# Patient Record
Sex: Female | Born: 1975 | Race: White | Hispanic: No | Marital: Married | Smoking: Never smoker
Health system: Southern US, Community
[De-identification: ages and names within clinical notes are randomized; demographics above are authoritative.]

## PROBLEM LIST (undated history)

## (undated) DIAGNOSIS — Z5189 Encounter for other specified aftercare: Secondary | ICD-10-CM

## (undated) DIAGNOSIS — R32 Unspecified urinary incontinence: Secondary | ICD-10-CM

## (undated) DIAGNOSIS — N390 Urinary tract infection, site not specified: Secondary | ICD-10-CM

## (undated) DIAGNOSIS — E785 Hyperlipidemia, unspecified: Secondary | ICD-10-CM

## (undated) DIAGNOSIS — I10 Essential (primary) hypertension: Secondary | ICD-10-CM

## (undated) DIAGNOSIS — Z86718 Personal history of other venous thrombosis and embolism: Secondary | ICD-10-CM

## (undated) DIAGNOSIS — N319 Neuromuscular dysfunction of bladder, unspecified: Secondary | ICD-10-CM

## (undated) DIAGNOSIS — I2699 Other pulmonary embolism without acute cor pulmonale: Secondary | ICD-10-CM

## (undated) DIAGNOSIS — L659 Nonscarring hair loss, unspecified: Secondary | ICD-10-CM

## (undated) HISTORY — PX: OTHER SURGICAL HISTORY: SHX169

## (undated) HISTORY — DX: Encounter for other specified aftercare: Z51.89

## (undated) HISTORY — DX: Essential (primary) hypertension: I10

## (undated) HISTORY — DX: Hyperlipidemia, unspecified: E78.5

## (undated) HISTORY — PX: DILATION AND CURETTAGE OF UTERUS: SHX78

## (undated) HISTORY — DX: Unspecified urinary incontinence: R32

## (undated) HISTORY — DX: Nonscarring hair loss, unspecified: L65.9

## (undated) HISTORY — DX: Other pulmonary embolism without acute cor pulmonale: I26.99

## (undated) HISTORY — DX: Urinary tract infection, site not specified: N39.0

## (undated) HISTORY — DX: Neuromuscular dysfunction of bladder, unspecified: N31.9

## (undated) HISTORY — DX: Personal history of other venous thrombosis and embolism: Z86.718

---
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
## 2011-01-12 ENCOUNTER — Ambulatory Visit: Payer: Self-pay | Admitting: Family Medicine

## 2011-02-01 ENCOUNTER — Ambulatory Visit: Payer: Self-pay | Admitting: Family Medicine

## 2011-03-03 ENCOUNTER — Ambulatory Visit: Payer: Self-pay | Admitting: Family Medicine

## 2011-04-03 ENCOUNTER — Ambulatory Visit: Payer: Self-pay | Admitting: Family Medicine

## 2012-08-28 ENCOUNTER — Ambulatory Visit: Payer: Self-pay | Admitting: Physician Assistant

## 2014-01-15 ENCOUNTER — Ambulatory Visit: Payer: Self-pay | Admitting: Obstetrics & Gynecology

## 2014-01-15 LAB — BASIC METABOLIC PANEL
ANION GAP: 6 — AB (ref 7–16)
BUN: 17 mg/dL (ref 7–18)
Calcium, Total: 8.4 mg/dL — ABNORMAL LOW (ref 8.5–10.1)
Chloride: 109 mmol/L — ABNORMAL HIGH (ref 98–107)
Co2: 24 mmol/L (ref 21–32)
Creatinine: 0.82 mg/dL (ref 0.60–1.30)
EGFR (African American): >60
EGFR (Non-African Amer.): >60
GLUCOSE: 90 mg/dL (ref 65–99)
Osmolality: 279 (ref 275–301)
Potassium: 3.9 mmol/L (ref 3.5–5.1)
Sodium: 139 mmol/L (ref 136–145)

## 2014-01-15 LAB — CBC WITH DIFFERENTIAL/PLATELET
Basophil #: 0.1 10*3/uL (ref 0.0–0.1)
Basophil %: 0.9 %
EOS ABS: 0.4 10*3/uL (ref 0.0–0.7)
Eosinophil %: 4.7 %
HCT: 37.8 % (ref 35.0–47.0)
HGB: 12.8 g/dL (ref 12.0–16.0)
Lymphocyte #: 2 10*3/uL (ref 1.0–3.6)
Lymphocyte %: 26.7 %
MCH: 32.8 pg (ref 26.0–34.0)
MCHC: 33.9 g/dL (ref 32.0–36.0)
MCV: 97 fL (ref 80–100)
Monocyte #: 0.5 x10 3/mm (ref 0.2–0.9)
Monocyte %: 6 %
NEUTROS ABS: 4.7 10*3/uL (ref 1.4–6.5)
Neutrophil %: 61.7 %
PLATELETS: 240 10*3/uL (ref 150–440)
RBC: 3.9 10*6/uL (ref 3.80–5.20)
RDW: 12.9 % (ref 11.5–14.5)
WBC: 7.6 10*3/uL (ref 3.6–11.0)

## 2014-01-18 ENCOUNTER — Ambulatory Visit: Payer: Self-pay | Admitting: Obstetrics & Gynecology

## 2014-01-20 LAB — PATHOLOGY REPORT

## 2014-04-04 ENCOUNTER — Encounter: Payer: Self-pay | Admitting: Podiatrist

## 2014-04-13 ENCOUNTER — Ambulatory Visit (INDEPENDENT_AMBULATORY_CARE_PROVIDER_SITE_OTHER): Payer: Federal, State, Local not specified - PPO | Admitting: Podiatry

## 2014-04-13 ENCOUNTER — Ambulatory Visit (INDEPENDENT_AMBULATORY_CARE_PROVIDER_SITE_OTHER): Payer: Federal, State, Local not specified - PPO

## 2014-04-13 ENCOUNTER — Encounter: Payer: Self-pay | Admitting: Podiatry

## 2014-04-13 VITALS — Ht 67.0 in | Wt 264.0 lb

## 2014-04-13 DIAGNOSIS — M722 Plantar fascial fibromatosis: Secondary | ICD-10-CM

## 2014-04-13 MED ORDER — DICLOFENAC SODIUM 75 MG PO TBEC: 75.0000 mg | DELAYED_RELEASE_TABLET | Freq: Two times a day (BID) | ORAL | Status: DC

## 2014-04-13 MED ORDER — TRIAMCINOLONE ACETONIDE 10 MG/ML IJ SUSP
10.0000 mg | Freq: Once | INTRAMUSCULAR | Status: AC
  Administered 2014-04-13: 10 mg

## 2014-04-13 NOTE — Progress Notes (Signed)
   Subjective:    Patient ID: Bridget Gould, female    DOB: 1976-08-27, 38 y.o.   MRN: [id number]  HPI Comments: Both feet feel like i am walking on golf balls , the heel and the arches , pain radiates on the left foot to the back of the heel   Foot Pain      Review of Systems  Genitourinary: Positive for urgency.       Frequency       Objective:   Physical Exam        Assessment & Plan:

## 2014-04-13 NOTE — Patient Instructions (Signed)

## 2014-04-13 NOTE — Progress Notes (Signed)
Subjective:     Patient ID: Bridget Gould, female   DOB: Mar 18, 1976, 38 y.o.   MRN: [id number]  Foot Pain   patient presents stating that she feels like she is walking on golf balls and her heels have been hurting her for about 8 months   Review of Systems  All other systems reviewed and are negative.      Objective:   Physical Exam  Nursing note and vitals reviewed. Constitutional: She is oriented to person, place, and time.  Cardiovascular: Intact distal pulses.   Musculoskeletal: Normal range of motion.  Neurological: She is oriented to person, place, and time.  Skin: Skin is warm.   neurovascular status found to be intact with range of motion subtalar midtarsal joint adequate and muscle strength within normal limits. Patient is found to have good digital perfusion with moderate depression of the arch upon weightbearing and significant discomfort in the plantar fascia both heels at the insertion into the calcaneus     Assessment:     Plantar fasciitis of both heels with inflammation and fluid buildup    Plan:     H&P and x-rays reviewed and today I injected the plantar fascia both feet 3 mg Kenalog 5 mg like Marcaine mixture applied fascially brace is and instructed on physical therapy and shoe gear modification and also discussed future orthotics as we gauged to respond to injection treatment reappoint one week

## 2014-05-01 ENCOUNTER — Ambulatory Visit (INDEPENDENT_AMBULATORY_CARE_PROVIDER_SITE_OTHER): Payer: Federal, State, Local not specified - PPO | Admitting: Podiatry

## 2014-05-01 VITALS — BP 128/75 | HR 103 | Resp 16

## 2014-05-01 DIAGNOSIS — M722 Plantar fascial fibromatosis: Secondary | ICD-10-CM

## 2014-05-01 NOTE — Progress Notes (Signed)
Subjective:     Patient ID: Bridget Gould, female   DOB: December 10, 1975, 38 y.o.   MRN: [id number]  HPI patient states that the heels are feeling much better but she no she needs support for the long-term and she understands the stretching exercises   Review of Systems     Objective:   Physical Exam Neurovascular status intact with muscle strength adequate and significant diminishment of pain in the plantar fascia of both heels    Assessment:     Plantar fasciitis of both heels which is improving with conservative care    Plan:     Discussed physical therapy and orthotics for the long-term and scanned for custom orthotic devices

## 2014-05-14 ENCOUNTER — Encounter: Payer: Self-pay | Admitting: Podiatry

## 2014-05-14 NOTE — Progress Notes (Signed)
Patients orthotics have arrived

## 2014-05-15 ENCOUNTER — Telehealth: Payer: Self-pay | Admitting: Podiatry

## 2014-05-15 NOTE — Telephone Encounter (Signed)
Left message for Graceann to make appointment to pick up orthotics/also mailed postcard.

## 2014-05-25 ENCOUNTER — Ambulatory Visit (INDEPENDENT_AMBULATORY_CARE_PROVIDER_SITE_OTHER): Payer: Federal, State, Local not specified - PPO | Admitting: *Deleted

## 2014-05-25 VITALS — BP 128/75 | HR 103 | Resp 16

## 2014-05-25 DIAGNOSIS — M722 Plantar fascial fibromatosis: Secondary | ICD-10-CM

## 2014-05-25 NOTE — Patient Instructions (Signed)

## 2014-05-25 NOTE — Progress Notes (Signed)
Pt presents to pick up orthotics. Went over wearing instructions with pt. 

## 2015-02-23 NOTE — Op Note (Signed)
PATIENT NAME:  GEORGA, STYS MR#:  [id number] DATE OF BIRTH:  12-Mar-1976  DATE OF PROCEDURE:  01/18/2014  PREOPERATIVE DIAGNOSIS:  Abnormal uterine bleeding.   POSTOPERATIVE DIAGNOSIS: Abnormal uterine bleeding.  PROCEDURE PERFORMED: Hysteroscopy, dilation and curettage.   SURGEON: Glean Salen, MD  ANESTHESIA: General.   ESTIMATED BLOOD LOSS: Minimal.   COMPLICATIONS: None.   SPECIMENS: Endocervical curettage and endometrial curettage.   DISPOSITION: To recovery room stable findings at no obvious polyps or fibroids visualized although there were some fragmented proliferative endometrial tissue throughout.   TECHNIQUE: The patient is prepped and draped in the usual sterile fashion after adequate anesthesia is obtained in the dorsal lithotomy position. Bladder is drained with a Robinson catheter. Speculum is placed and the anterior lip of the cervix is grasped with a tenaculum. The endocervical canal is then curetted using a Kevorkian curette to obtain endocervical curettage specimen. The cervix is dilated and the uterus is sounded to 7 cm. Hysteroscope with distention of the intrauterine cavity using lactated Ringer's is performed with the above-mentioned findings visualized. There is a minimal discrepancy of fluid. A sharp curettage is performed with a banjo curette with specimen sent to pathology for further review. Excellent hemostasis is noted. The tenaculum is removed with excellent hemostasis noted. The patient goes to the recovery room in stable condition. All sponge, needle counts are correct.    ____________________________ R. Barnett Applebaum, MD rph:sg D: 01/18/2014 12:53:02 ET T: 01/18/2014 13:16:26 ET JOB#: [id number]  cc: Glean Salen, MD, <Dictator> Gae Dry MD ELECTRONICALLY SIGNED 01/19/2014 2:46

## 2015-06-13 ENCOUNTER — Ambulatory Visit
Admission: EM | Admit: 2015-06-13 | Discharge: 2015-06-13 | Disposition: A | Discharge status: Home / Self Care | Payer: Federal, State, Local not specified - PPO | Attending: Internal Medicine | Admitting: Internal Medicine

## 2015-06-13 ENCOUNTER — Encounter: Payer: Self-pay | Admitting: Emergency Medicine

## 2015-06-13 DIAGNOSIS — J0140 Acute pansinusitis, unspecified: Secondary | ICD-10-CM

## 2015-06-13 MED ORDER — PREDNISONE 50 MG PO TABS
50.0000 mg | ORAL_TABLET | Freq: Every day | ORAL | Status: DC
Start: 2015-06-13 — End: 2015-09-02

## 2015-06-13 MED ORDER — CEFDINIR 300 MG PO CAPS: 300.0000 mg | ORAL_CAPSULE | Freq: Two times a day (BID) | ORAL | Status: DC

## 2015-06-13 NOTE — ED Notes (Signed)
Patient c/o sinus pain and pressure, HAs, and runny nose for 3 weeks.  Patient denies fevers.

## 2015-06-13 NOTE — ED Provider Notes (Signed)
CSN: [id number]     Arrival date & time 06/13/15  1544 History   First MD Initiated Contact with Patient 06/13/15 1638     Chief Complaint  Patient presents with  . Facial Pain  . Headache  . Nasal Congestion   HPI Patient is a 39 year old lady who presents today with a three-week history of left sided sinus congestion, runny nose (copious), and morning headache. No sore throat, no cough. No fever. Her face is tender and uncomfortable over the maxillary and frontal areas. She does not have a history of sinus issues.  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 She is participating in a triathlon next weekend. No nausea/vomiting, no diarrhea. She has a good appetite. No achiness.  Past Medical History  Diagnosis Date  . Hypertension   . Neurogenic bladder   . Alopecia    Past Surgical History  Procedure Laterality Date  . 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 Dilation and curettage of uterus     Family history: Father has had a heart attack 2; aunt has had breast cancer; mother has had asthma Social History  Substance Use Topics  . Smoking status: Never Smoker   . Smokeless tobacco: Never Used  . Alcohol Use: Yes    Review of Systems  All other systems reviewed and are negative.   Allergies  Penicillins Has taken cephalosporins without difficulty  Home Medications   Prior to Admission medications   Medication Sig Start Date End Date Taking? Authorizing Provider  diclofenac (VOLTAREN) 75 MG EC tablet Take 1 tablet (75 mg total) by mouth 2 (two) times daily. 04/13/14   Wallene Huh, DPM  lisinopril (PRINIVIL,ZESTRIL) 5 MG tablet Take 5 mg by mouth daily.    Historical Provider, MD  nitrofurantoin (MACRODANTIN) 25 MG capsule Take 25 mg by mouth daily.    Historical Provider, MD  norethindrone (CAMILA) 0.35 MG tablet Take 1 tablet by mouth daily.    Historical Provider, MD  venlafaxine (EFFEXOR) 75  MG tablet Take 75 mg by mouth daily.    Historical Provider, MD   BP 142/76 mmHg  Pulse 79  Temp(Src) 98.9 F (37.2 C) (Oral)  Resp 16  Ht 5\' 7"  (1.702 m)  Wt 265 lb (120.203 kg)  BMI 41.50 kg/m2  SpO2 98%  LMP 05/24/2015 (Exact Date) Physical Exam  Constitutional: She is oriented to person, place, and time. No distress.  Alert, nicely groomed Enthusiastic and cheerful Alopecia, no hair on scalp, no eyelashes, no eye brows  HENT:  Head: Atraumatic.  Bilateral TMs are opaque, no erythema Marked left nasal congestion, moderate on the right, with mucousy material present Throat a little bit red with postnasal drainage evident  Eyes:  Conjugate gaze, no eye redness/drainage  Neck: Neck supple.  Cardiovascular: Normal rate and regular rhythm.   Pulmonary/Chest: No respiratory distress. She has no wheezes. She has no rales.  Lungs clear, symmetric breath sounds  Abdominal: She exhibits no distension.  Musculoskeletal: Normal range of motion.  No leg swelling  Neurological: She is alert and oriented to person, place, and time.  Skin: Skin is warm and dry.  No cyanosis  Nursing note and vitals reviewed.   ED Course  Procedures  None  MDM   1. Acute pansinusitis, recurrence not specified    Discharge Medication List as of 06/13/2015  4:50 PM  START taking these medications   Details  cefdinir (OMNICEF) 300 MG capsule Take 1 capsule (300 mg total) by mouth 2 (two) times daily., Starting 06/13/2015, Until Discontinued, Normal    predniSONE (DELTASONE) 50 MG tablet Take 1 tablet (50 mg total) by mouth daily., Starting 06/13/2015, Until Discontinued, Normal       Nasacort or flonase may help with residual congestion.  Recheck or follow-up PCP/Alicia Copland for persistent symptoms, if not starting to improve after 2 or 3 days, increasing nasal discharge, or new fever greater than 100.5.    Sherlene Shams, MD 06/13/15 [phone]

## 2015-06-13 NOTE — Discharge Instructions (Signed)
Prescriptions for omnicef (cefdinir, an antibiotic) and  prednisone (a steroid, for severe congestion) were sent to the [facility] in [place]. Recheck or followup pcp/Janet Dear if not starting to improve in about 2-3 days,  for new fever >100.5, or increasing runny nose. Nasacort or flonase over the counter may be helpful for residual congestion.  Sinusitis Sinusitis is redness, soreness, and puffiness (inflammation) of the air pockets in the bones of your face (sinuses). The redness, soreness, and puffiness can cause air and mucus to get trapped in your sinuses. This can allow germs to grow and cause an infection.  HOME CARE   Drink enough fluids to keep your pee (urine) clear or pale yellow.  Use a humidifier in your home.  Run a hot shower to create steam in the bathroom. Sit in the bathroom with the door closed. Breathe in the steam 3-4 times a day.  Put a warm, moist washcloth on your face 3-4 times a day, or as told by your doctor.  Use salt water sprays (saline sprays) to wet the thick fluid in your nose. This can help the sinuses drain.  Only take medicine as told by your doctor. GET HELP RIGHT AWAY IF:   Your pain gets worse.  You have very bad headaches.  You are sick to your stomach (nauseous).  You throw up (vomit).  You are very sleepy (drowsy) all the time.  Your face is puffy (swollen).  Your vision changes.  You have a stiff neck.  You have trouble breathing. MAKE SURE YOU:   Understand these instructions.  Will watch your condition.  Will get help right away if you are not doing well or get worse. Document Released: 04/06/2008 Document Revised: 07/13/2012 Document Reviewed: 05/24/2012 [facility] Patient Information 2015 [place], [place]. This information is not intended to replace advice given to you by your health care provider. Make sure you discuss any questions you have with your health care provider.

## 2015-08-03 DIAGNOSIS — I2699 Other pulmonary embolism without acute cor pulmonale: Secondary | ICD-10-CM

## 2015-08-03 HISTORY — DX: Other pulmonary embolism without acute cor pulmonale: I26.99

## 2015-08-20 ENCOUNTER — Encounter: Payer: Self-pay | Admitting: Emergency Medicine

## 2015-08-20 ENCOUNTER — Ambulatory Visit
Admission: EM | Admit: 2015-08-20 | Discharge: 2015-08-20 | Disposition: A | Discharge status: Home / Self Care | Payer: Federal, State, Local not specified - PPO | Attending: Family Medicine | Admitting: Family Medicine

## 2015-08-20 DIAGNOSIS — J0100 Acute maxillary sinusitis, unspecified: Secondary | ICD-10-CM | POA: Diagnosis not present

## 2015-08-20 LAB — RAPID STREP SCREEN (MED CTR MEBANE ONLY): Streptococcus, Group A Screen (Direct): NEGATIVE

## 2015-08-20 MED ORDER — AZITHROMYCIN 250 MG PO TABS: ORAL_TABLET | ORAL | Status: DC

## 2015-08-20 NOTE — ED Notes (Signed)
Patient c/o sore throat, runny nose, ear pain and sinus congestion since Friday.  Patient denies fevers.

## 2015-08-20 NOTE — ED Provider Notes (Signed)
CSN: [id number]     Arrival date & time 08/20/15  0732 History   First MD Initiated Contact with Patient 08/20/15 [phone]     Chief Complaint  Patient presents with  . Sore Throat  . Otalgia  . Facial Pain   (Consider location/radiation/quality/duration/timing/severity/associated sxs/prior Treatment) HPI Comments: 39 yo female with a 1 week h/o sore throat, sinus pressure, sinus headaches, sinus congestion, ear pressure. Denies fevers, chills, chest pains, shortness of breath.   The history is provided by the patient.    Past Medical History  Diagnosis Date  . Hypertension   . Neurogenic bladder   . Alopecia    Past Surgical History  Procedure Laterality Date  . Tumor removed    . Dilation and curettage of uterus     History reviewed. No pertinent family history. Social History  Substance Use Topics  . Smoking status: Never Smoker   . Smokeless tobacco: Never Used  . Alcohol Use: Yes   OB History    No data available     Review of Systems  Allergies  Penicillins  Home Medications   Prior to Admission medications   Medication Sig Start Date End Date Taking? Authorizing Provider  azithromycin (ZITHROMAX Z-PAK) 250 MG tablet 2 tabs po once day 1, then 1 tab po qd for next 4 days 08/20/15   Norval Gable, MD  cefdinir (OMNICEF) 300 MG capsule Take 1 capsule (300 mg total) by mouth 2 (two) times daily. 06/13/15   Sherlene Shams, MD  diclofenac (VOLTAREN) 75 MG EC tablet Take 1 tablet (75 mg total) by mouth 2 (two) times daily. 04/13/14   Wallene Huh, DPM  lisinopril (PRINIVIL,ZESTRIL) 5 MG tablet Take 5 mg by mouth daily.    Historical Provider, MD  nitrofurantoin (MACRODANTIN) 25 MG capsule Take 100 mg by mouth daily.     Historical Provider, MD  norethindrone (CAMILA) 0.35 MG tablet Take 1 tablet by mouth daily.    Historical Provider, MD  predniSONE (DELTASONE) 50 MG tablet Take 1 tablet (50 mg total) by mouth daily. 06/13/15   Sherlene Shams, MD  venlafaxine  (EFFEXOR) 75 MG tablet Take 75 mg by mouth daily.    Historical Provider, MD   Meds Ordered and Administered this Visit  Medications - No data to display  BP 132/76 mmHg  Pulse 79  Temp(Src) 97.6 F (36.4 C) (Tympanic)  Resp 16  Ht 5\' 7"  (1.702 m)  Wt 262 lb (118.842 kg)  BMI 41.03 kg/m2  SpO2 99%  LMP 08/14/2015 (Exact Date) No data found.   Physical Exam  Constitutional: She appears well-developed and well-nourished. No distress.  HENT:  Head: Normocephalic and atraumatic.  Right Ear: Tympanic membrane, external ear and ear canal normal.  Left Ear: Tympanic membrane, external ear and ear canal normal.  Nose: Mucosal edema and rhinorrhea present. No nose lacerations, sinus tenderness, nasal deformity, septal deviation or nasal septal hematoma. No epistaxis.  No foreign bodies. Right sinus exhibits maxillary sinus tenderness and frontal sinus tenderness. Left sinus exhibits maxillary sinus tenderness and frontal sinus tenderness.  Mouth/Throat: Uvula is midline and mucous membranes are normal. Posterior oropharyngeal erythema present. No oropharyngeal exudate.  Eyes: Conjunctivae and EOM are normal. Pupils are equal, round, and reactive to light. Right eye exhibits no discharge. Left eye exhibits no discharge. No scleral icterus.  Neck: Normal range of motion. Neck supple. No thyromegaly present.  Cardiovascular: Normal rate, regular rhythm and normal heart sounds.   Pulmonary/Chest: Effort normal  and breath sounds normal. No respiratory distress. She has no wheezes. She has no rales.  Lymphadenopathy:    She has no cervical adenopathy.  Skin: She is not diaphoretic.  Nursing note and vitals reviewed.   ED Course  Procedures (including critical care time)  Labs Review Labs Reviewed  RAPID STREP SCREEN (NOT AT [facility])  CULTURE, GROUP A STREP ([facility] ONLY)    Imaging Review No results found.   Visual Acuity Review  Right Eye Distance:   Left Eye Distance:   Bilateral  Distance:    Right Eye Near:   Left Eye Near:    Bilateral Near:         MDM   1. Acute maxillary sinusitis, recurrence not specified    Discharge Medication List as of 08/20/2015  8:14 AM    START taking these medications   Details  azithromycin (ZITHROMAX Z-PAK) 250 MG tablet 2 tabs po once day 1, then 1 tab po qd for next 4 days, Normal      1.  diagnosis reviewed with patient 2. rx as per orders above; reviewed possible side effects, interactions, risks and benefits  3. Recommend supportive treatment with otc flonase prn 4. Follow prn if symptoms worsen or don't improve    Norval Gable, MD 08/20/15 [phone]

## 2015-08-21 DIAGNOSIS — Z86018 Personal history of other benign neoplasm: Secondary | ICD-10-CM | POA: Insufficient documentation

## 2015-08-21 DIAGNOSIS — I2699 Other pulmonary embolism without acute cor pulmonale: Secondary | ICD-10-CM

## 2015-08-21 HISTORY — DX: Other pulmonary embolism without acute cor pulmonale: I26.99

## 2015-08-21 HISTORY — DX: Personal history of other benign neoplasm: Z86.018

## 2015-08-22 LAB — CULTURE, GROUP A STREP (THRC)

## 2015-09-02 ENCOUNTER — Encounter: Payer: Self-pay | Admitting: Nurse Practitioner

## 2015-09-02 ENCOUNTER — Ambulatory Visit (INDEPENDENT_AMBULATORY_CARE_PROVIDER_SITE_OTHER): Payer: Federal, State, Local not specified - PPO | Admitting: Nurse Practitioner

## 2015-09-02 VITALS — BP 108/62 | HR 85 | Temp 99.0°F | Resp 12 | Ht 67.0 in | Wt 258.0 lb

## 2015-09-02 DIAGNOSIS — N319 Neuromuscular dysfunction of bladder, unspecified: Secondary | ICD-10-CM

## 2015-09-02 DIAGNOSIS — I2699 Other pulmonary embolism without acute cor pulmonale: Secondary | ICD-10-CM | POA: Diagnosis not present

## 2015-09-02 DIAGNOSIS — E785 Hyperlipidemia, unspecified: Secondary | ICD-10-CM

## 2015-09-02 DIAGNOSIS — I10 Essential (primary) hypertension: Secondary | ICD-10-CM

## 2015-09-02 DIAGNOSIS — L659 Nonscarring hair loss, unspecified: Secondary | ICD-10-CM | POA: Diagnosis not present

## 2015-09-02 DIAGNOSIS — J209 Acute bronchitis, unspecified: Secondary | ICD-10-CM | POA: Diagnosis not present

## 2015-09-02 DIAGNOSIS — Z8679 Personal history of other diseases of the circulatory system: Secondary | ICD-10-CM | POA: Insufficient documentation

## 2015-09-02 DIAGNOSIS — Z86711 Personal history of pulmonary embolism: Secondary | ICD-10-CM | POA: Insufficient documentation

## 2015-09-02 LAB — CBC WITH DIFFERENTIAL/PLATELET
BASOS ABS: 0 10*3/uL (ref 0.0–0.1)
Basophils Relative: 0.5 % (ref 0.0–3.0)
Eosinophils Absolute: 0.5 10*3/uL (ref 0.0–0.7)
Eosinophils Relative: 4.8 % (ref 0.0–5.0)
HEMATOCRIT: 40.7 % (ref 36.0–46.0)
HEMOGLOBIN: 13.5 g/dL (ref 12.0–15.0)
LYMPHS ABS: 3.4 10*3/uL (ref 0.7–4.0)
LYMPHS PCT: 32.7 % (ref 12.0–46.0)
MCHC: 33.1 g/dL (ref 30.0–36.0)
MCV: 95.1 fl (ref 78.0–100.0)
MONOS PCT: 6.4 % (ref 3.0–12.0)
Monocytes Absolute: 0.7 10*3/uL (ref 0.1–1.0)
NEUTROS PCT: 55.6 % (ref 43.0–77.0)
Neutro Abs: 5.8 10*3/uL (ref 1.4–7.7)
Platelets: 328 10*3/uL (ref 150.0–400.0)
RBC: 4.28 Mil/uL (ref 3.87–5.11)
RDW: 13.3 % (ref 11.5–15.5)
WBC: 10.4 10*3/uL (ref 4.0–10.5)

## 2015-09-02 LAB — COMPREHENSIVE METABOLIC PANEL
ALK PHOS: 119 U/L — AB (ref 39–117)
ALT: 20 U/L (ref 0–35)
AST: 17 U/L (ref 0–37)
Albumin: 4.1 g/dL (ref 3.5–5.2)
BILIRUBIN TOTAL: 0.2 mg/dL (ref 0.2–1.2)
BUN: 16 mg/dL (ref 6–23)
CHLORIDE: 107 meq/L (ref 96–112)
CO2: 23 meq/L (ref 19–32)
Calcium: 9.5 mg/dL (ref 8.4–10.5)
Creatinine, Ser: 0.91 mg/dL (ref 0.40–1.20)
GFR: 73.42 mL/min (ref >60.00)
GLUCOSE: 79 mg/dL (ref 70–99)
POTASSIUM: 4.5 meq/L (ref 3.5–5.1)
SODIUM: 138 meq/L (ref 135–145)
TOTAL PROTEIN: 7.7 g/dL (ref 6.0–8.3)

## 2015-09-02 NOTE — Assessment & Plan Note (Signed)
Pt is improving. She has finished her antibiotics. We will obtain a CMET today to check for hyperkalemia (lisinopril and bactrim DS taken together). Will follow as needed. Referred to Pulmonology at [facility] for follow up.

## 2015-09-02 NOTE — Progress Notes (Signed)
Patient ID: Bridget Gould, female    DOB: 1976/06/19  Age: 39 y.o. MRN: [id number]  CC: Establish Care   HPI Bridget Gould presents for establishing care and CC of hospital follow up.   1) New pt info:  Immunizations- unknown tdap, flu vaccine given 08/22/2015  Mammogram- 2016   Pap- 01/5858 Seeing Bridget Gould Exam- 08/10/15   Foot exam- 05/2014 non-diabetic [facility]   LMP- 08/15/15 lasted 7 and was normal for pt  2) Chronic Problems-  HTN/HLD- Stable currently, labs this July   Blood clots- PE  Blood transfusion 1978  Urinary incontinence- Neurogenic bladder [facility] in [place]- Dr. Matilde Gould neurogenic bladder, self-cathing twice a year  Alopecia- Worked up at [facility] and no determined cause   Started in 2004 and had no hair by 2005 she reports  3) Acute Problems-  Woke up on the 19th at 0330 and felt she couldn't breathe, tried things at home like hot shower, tea, and cough drops. Called husband and he advised his mother to take her to the hospital. She was seen at [facility] around 10 am.   Hospital visit to [facility] for Acute Resp. Failure with hypoxia and PE. Initial CTA was read as normal, but had poor quality and after the radiologist reviewed films and high clinical suspicion she was then diagnosed. RLL vasculature probable area.    Apixaban 10 mg x 6 days two tablets twice daily followed by 3 months of 5 mg BID then discuss cessation.   History Bridget Gould has a past medical history of Hypertension; Neurogenic bladder; Alopecia; Hyperlipidemia; H/O blood clots; Blood transfusion without reported diagnosis (1978); Urine incontinence; Urinary tract infection; Alopecia; Neurogenic bladder; and Pulmonary embolism (Bridget Gould) (08/2015).   She has past surgical history that includes tumor removed; Dilation and curettage of uterus; and tumor (1978/2004).   Her family history includes Alcohol abuse in her sister; Arthritis in her father; Cancer in her maternal aunt  and maternal grandfather; Diabetes in her paternal grandfather; Drug abuse in her sister; Heart disease in her father; Hyperlipidemia in her father and mother; Hypertension in her father; Mental illness in her sister.She reports that she has never smoked. She has never used smokeless tobacco. She reports that she drinks alcohol. She reports that she does not use illicit drugs.  Outpatient Prescriptions Prior to Visit  Medication Sig Dispense Refill  . lisinopril (PRINIVIL,ZESTRIL) 5 MG tablet Take 5 mg by mouth daily.    . norethindrone (CAMILA) 0.35 MG tablet Take 1 tablet by mouth daily.    Marland Kitchen venlafaxine (EFFEXOR) 75 MG tablet Take 75 mg by mouth daily.    . nitrofurantoin (MACRODANTIN) 25 MG capsule Take 100 mg by mouth daily.     Marland Kitchen azithromycin (ZITHROMAX Z-PAK) 250 MG tablet 2 tabs po once day 1, then 1 tab po qd for next 4 days (Patient not taking: Reported on 09/02/2015) 6 each 0  . cefdinir (OMNICEF) 300 MG capsule Take 1 capsule (300 mg total) by mouth 2 (two) times daily. (Patient not taking: Reported on 09/02/2015) 20 capsule 0  . diclofenac (VOLTAREN) 75 MG EC tablet Take 1 tablet (75 mg total) by mouth 2 (two) times daily. (Patient not taking: Reported on 09/02/2015) 50 tablet 2  . predniSONE (DELTASONE) 50 MG tablet Take 1 tablet (50 mg total) by mouth daily. (Patient not taking: Reported on 09/02/2015) 3 tablet 0   No facility-administered medications prior to visit.    ROS Review of Systems  Constitutional: Negative for fever, chills, diaphoresis and fatigue.  Respiratory: Negative for chest tightness, shortness of breath and wheezing.   Cardiovascular: Negative for chest pain, palpitations and leg swelling.  Gastrointestinal: Negative for nausea, vomiting and diarrhea.  Skin: Negative for rash.  Neurological: Negative for dizziness, weakness, numbness and headaches.  Hematological: Does not bruise/bleed easily.  Psychiatric/Behavioral: The patient is not nervous/anxious.      Objective:  BP 108/62 mmHg  Pulse 85  Temp(Src) 99 F (37.2 C)  Resp 12  Ht 5' 7"  (1.702 m)  Wt 258 lb (117.028 kg)  BMI 40.40 kg/m2  SpO2 97%  LMP 08/14/2015 (Exact Date)  Physical Exam  Constitutional: She is oriented to person, place, and time. She appears well-developed and well-nourished. No distress.  HENT:  Head: Normocephalic and atraumatic.  Right Ear: External ear normal.  Left Ear: External ear normal.  Alopecia  Eyes: Right eye exhibits no discharge. Left eye exhibits no discharge. No scleral icterus.  Cardiovascular: Normal rate, regular rhythm and normal heart sounds.  Exam reveals no gallop and no friction rub.   No murmur heard. Pulmonary/Chest: Effort normal and breath sounds normal. No respiratory distress. She has no wheezes. She has no rales. She exhibits no tenderness.  Neurological: She is alert and oriented to person, place, and time. No cranial nerve deficit. She exhibits normal muscle tone. Coordination normal.  Skin: Skin is warm and dry. No rash noted. She is not diaphoretic.  Psychiatric: She has a normal mood and affect. Her behavior is normal. Judgment and thought content normal.  Pleasant personality   Assessment & Plan:   Bridget Gould was seen today for establish care.  Diagnoses and all orders for this visit:  Other acute pulmonary embolism without acute cor pulmonale (HCC) -     Ambulatory referral to Pulmonology -     Comp Met (CMET) -     Factor 5 leiden -     CBC w/Diff  Acute bronchitis, unspecified organism -     Comp Met (CMET) -     CBC w/Diff  Neurogenic bladder  Alopecia  Benign essential HTN  HLD (hyperlipidemia)   I have discontinued Bridget Gould's diclofenac, cefdinir, predniSONE, and azithromycin. I am also having her maintain her norethindrone, lisinopril, nitrofurantoin, venlafaxine, sulfamethoxazole-trimethoprim, and apixaban.  Meds ordered this encounter  Medications  . sulfamethoxazole-trimethoprim  (BACTRIM,SEPTRA) 400-80 MG tablet    Sig: Take 1 tablet by mouth daily.  Marland Kitchen apixaban (ELIQUIS) 5 MG TABS tablet    Sig: Take 5 mg by mouth daily.     Follow-up: Return in about 7 weeks (around 10/21/2015) for Follow up for anticoagulation .

## 2015-09-02 NOTE — Assessment & Plan Note (Signed)
Unknown etiology. Patient was worked up at [facility].

## 2015-09-02 NOTE — Assessment & Plan Note (Signed)
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 She is followed by [facility] in [place]. Her next appointment with them is November 7. He reports ONLY twice a year. She does have frequent urinary tract infections. She is back on nitrofurantoin.

## 2015-09-02 NOTE — Assessment & Plan Note (Addendum)
Patient had probable labs done at her OB/GYN appointment in July this year. Will obtain records  Encouraged healthy diet and exercise

## 2015-09-02 NOTE — Patient Instructions (Addendum)
Continue your current regimen.   Continue to follow up with [facility].   We will contact you with your pulmonary referral.   Please visit the lab before leaving today.  Welcome to [facility]! Nice to meet you.

## 2015-09-02 NOTE — Assessment & Plan Note (Signed)
BP Readings from Last 3 Encounters:  09/02/15 108/62  08/20/15 132/76  06/13/15 142/76   Patient is stable currently will continue on lisinopril 5 mg daily.

## 2015-09-02 NOTE — Assessment & Plan Note (Signed)
Pt was strongly suspected to have RLL vascular PE after initial CTA was normal. Eliquis started. Pt feels much improved and denies nosebleeds or gums bleeding. Will check a factor 5 leiden today. Obtain CMET and CBC w/ diff and refer to pulmonology per [facility].

## 2015-09-02 NOTE — Progress Notes (Signed)
Pre visit review using our clinic review tool, if applicable. No additional management support is needed unless otherwise documented below in the visit note. 

## 2015-09-05 LAB — FACTOR 5 LEIDEN

## 2015-09-12 ENCOUNTER — Telehealth: Payer: Self-pay | Admitting: Internal Medicine

## 2015-09-12 ENCOUNTER — Ambulatory Visit (INDEPENDENT_AMBULATORY_CARE_PROVIDER_SITE_OTHER): Payer: Federal, State, Local not specified - PPO | Admitting: Internal Medicine

## 2015-09-12 ENCOUNTER — Encounter: Payer: Self-pay | Admitting: Internal Medicine

## 2015-09-12 ENCOUNTER — Ambulatory Visit
Admission: RE | Admit: 2015-09-12 | Discharge: 2015-09-12 | Disposition: A | Discharge status: Home / Self Care | Payer: Federal, State, Local not specified - PPO | Source: Ambulatory Visit | Attending: Internal Medicine | Admitting: Internal Medicine

## 2015-09-12 VITALS — BP 134/86 | HR 73 | Ht 67.0 in | Wt 260.0 lb

## 2015-09-12 DIAGNOSIS — R0600 Dyspnea, unspecified: Secondary | ICD-10-CM | POA: Diagnosis not present

## 2015-09-12 DIAGNOSIS — R0602 Shortness of breath: Secondary | ICD-10-CM | POA: Insufficient documentation

## 2015-09-12 DIAGNOSIS — D6859 Other primary thrombophilia: Secondary | ICD-10-CM | POA: Diagnosis not present

## 2015-09-12 NOTE — Telephone Encounter (Signed)
[name] from [name] vascular dept called report for Bilateral Venous Doppler. States pt is negative for DVT. Dr. [name] informed. Nothing further needed.

## 2015-09-12 NOTE — Patient Instructions (Signed)

## 2015-09-12 NOTE — Progress Notes (Signed)
[facility] Consultation      Date: 09/12/2015,   MRN# [id number] DENAYE ANSPACH 09-Jan-1976 Code Status:  Hosp day:@LENGTHOFSTAYDAYS @ Referring MD: @ATDPROV @     PCP:      AdmissionWeight: 260 lb (117.935 kg)                 CurrentWeight: 260 lb (117.935 kg) Chasady L Okeefe is a 39 y.o. old female seen in consultation for recent dx of PE.    CHIEF COMPLAINT:  Ct chest with PE, cough, SOB    HISTORY OF PRESENT ILLNESS  39 yo white female seen today for recent dx of PE on CT scan reports(CT chest not available-from outside hospital) Patient has sinus congestion on Oct 18th-was seen at urgent care and was given ABX On OCT 19, patient had acute SOB, Ct chest reports showed RLL PE-was started on elliquos  Patient has associated symptoms of extreme fatigue since dx of PE, along with progressive SOB Patient otherwise very active, trained for marathons in the past, she weighs 260 pounds, does NOT smoke Patient has neurogenic bladder on chronic nitrofurantion for 10 years  She has no lower ext/leg swelling, she has no fevers, chills, NVD. No previous h/o blood clots, no family history of blood clots  Patient dx with alopecia in 2005-states that she ws worked up for lupus in the past Patient on camilla-non estrogen therapy, she is a nonsmoker, does not use street drugs  All other ROS negative  PAST MEDICAL HISTORY   Past Medical History  Diagnosis Date  . Hypertension   . Neurogenic bladder   . Alopecia   . Hyperlipidemia   . H/O blood clots   . Blood transfusion without reported diagnosis 1978  . Urine incontinence   . Urinary tract infection   . Alopecia   . Neurogenic bladder   . Pulmonary embolism ([place]) 08/2015     SURGICAL HISTORY   Past Surgical History  Procedure Laterality Date  . Tumor removed    . Dilation and curettage of uterus    . 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 Hyperlipidemia Mother   . Arthritis Father   . Hyperlipidemia Father   . Heart disease Father   . Hypertension Father   . Alcohol abuse Sister   . Drug abuse Sister   . Mental illness Sister     BiPolar  . Cancer Maternal Aunt     Breast Cancer  . Cancer Maternal Grandfather     Colon/Prostate Cancer  . Diabetes Paternal Grandfather      SOCIAL HISTORY   Social History  Substance Use Topics  . Smoking status: Never Smoker   . Smokeless tobacco: Never Used  . Alcohol Use: 0.0 oz/week    0 Standard drinks or equivalent per week     Comment: 1 drink every other week      MEDICATIONS    Home Medication:  Current Outpatient Rx  Name  Route  Sig  Dispense  Refill  . apixaban (ELIQUIS) 5 MG TABS tablet   Oral   Take 5 mg by mouth daily.         Marland Kitchen lisinopril (PRINIVIL,ZESTRIL) 5 MG tablet   Oral   Take 5 mg by mouth daily.         . norethindrone (CAMILA) 0.35 MG tablet   Oral  Take 1 tablet by mouth daily.         Marland Kitchen sulfamethoxazole-trimethoprim (BACTRIM,SEPTRA) 400-80 MG tablet   Oral   Take 1 tablet by mouth daily.         Marland Kitchen venlafaxine (EFFEXOR) 75 MG tablet   Oral   Take 75 mg by mouth daily.           Current Medication:  Current outpatient prescriptions:  .  apixaban (ELIQUIS) 5 MG TABS tablet, Take 5 mg by mouth daily., Disp: , Rfl:  .  lisinopril (PRINIVIL,ZESTRIL) 5 MG tablet, Take 5 mg by mouth daily., Disp: , Rfl:  .  norethindrone (CAMILA) 0.35 MG tablet, Take 1 tablet by mouth daily., Disp: , Rfl:  .  sulfamethoxazole-trimethoprim (BACTRIM,SEPTRA) 400-80 MG tablet, Take 1 tablet by mouth daily., Disp: , Rfl:  .  venlafaxine (EFFEXOR) 75 MG tablet, Take 75 mg by mouth daily., Disp: , Rfl:     ALLERGIES   Penicillins     REVIEW OF SYSTEMS   Review of Systems  Constitutional: Positive for malaise/fatigue. Negative for fever, chills, weight loss and diaphoresis.  HENT: Negative for hearing loss and  tinnitus.   Eyes: Negative for blurred vision and double vision.  Respiratory: Positive for cough and shortness of breath. Negative for hemoptysis, sputum production and wheezing.   Cardiovascular: Positive for orthopnea. Negative for claudication and leg swelling.  Gastrointestinal: Negative for heartburn, nausea, vomiting and abdominal pain.  Genitourinary: Positive for urgency and frequency.  Musculoskeletal: Negative for myalgias, back pain and neck pain.  Neurological: Positive for weakness. Negative for dizziness, tingling, tremors and headaches.  Endo/Heme/Allergies: Does not bruise/bleed easily.  Psychiatric/Behavioral: Negative for depression. The patient is not nervous/anxious.      VS: BP 134/86 mmHg  Pulse 73  Ht 5\' 7"  (1.702 m)  Wt 260 lb (117.935 kg)  BMI 40.71 kg/m2  SpO2 97%  LMP 08/14/2015 (Exact Date)     PHYSICAL EXAM  Physical Exam  Constitutional: She is oriented to person, place, and time. She appears well-developed and well-nourished. No distress.  HENT:  Head: Normocephalic and atraumatic.  Mouth/Throat: No oropharyngeal exudate.  Eyes: EOM are normal. Pupils are equal, round, and reactive to light.  Neck: Normal range of motion. Neck supple.  Cardiovascular: Normal rate, regular rhythm and normal heart sounds.   No murmur heard. Pulmonary/Chest: No respiratory distress. She has no wheezes.  Abdominal: Soft. Bowel sounds are normal.  Musculoskeletal: Normal range of motion. She exhibits no edema.  Neurological: She is alert and oriented to person, place, and time.  Skin: Skin is warm. She is not diaphoretic.  Psychiatric: She has a normal mood and affect.        ASSESSMENT/PLAN   39 yo obese white female with recent dx of RLL Pulmonary embolism, at this point, patient will need extensive work up for cause of her blood clot  1.will need 6MWT  To assess oxygen needs 2.overnight pulse oximetry 3.check ECHO to assess funnction and PAP  pressure 4.US lower ext to assess for DVT 5.will need Hem ONC consiult for hyper-coaguable state work up 6.will need 6MWT      The Patient requires high complexity decision making for assessment and support, frequent evaluation and titration of therapies, application of advanced monitoring technologies and extensive interpretation of multiple databases.  Patient satisfied with Plan of action and management. All questions answered  Corrin Parker, M.D.  [facility]  Medical Director [place] Director  [facility] Cardio-Pulmonary Department

## 2015-09-17 ENCOUNTER — Ambulatory Visit (INDEPENDENT_AMBULATORY_CARE_PROVIDER_SITE_OTHER): Payer: Federal, State, Local not specified - PPO | Admitting: Internal Medicine

## 2015-09-17 DIAGNOSIS — R0600 Dyspnea, unspecified: Secondary | ICD-10-CM

## 2015-09-17 LAB — PULMONARY FUNCTION TEST
DL/VA % pred: 89 %
DL/VA: 4.62 ml/min/mmHg/L
DLCO UNC % PRED: 77 %
DLCO UNC: 22.04 ml/min/mmHg
FEF 25-75 PRE: 4.45 L/s
FEF 25-75 Post: 4.18 L/sec
FEF2575-%CHANGE-POST: -6 %
FEF2575-%PRED-PRE: 131 %
FEF2575-%Pred-Post: 123 %
FEV1-%CHANGE-POST: 5 %
FEV1-%PRED-POST: 102 %
FEV1-%Pred-Pre: 97 %
FEV1-Post: 3.43 L
FEV1-Pre: 3.27 L
FEV1FVC-%Change-Post: -1 %
FEV1FVC-%Pred-Pre: 107 %
FEV6-%CHANGE-POST: 6 %
FEV6-%PRED-POST: 97 %
FEV6-%Pred-Pre: 91 %
FEV6-POST: 3.91 L
FEV6-PRE: 3.66 L
FEV6FVC-%PRED-POST: 102 %
FEV6FVC-%PRED-PRE: 102 %
FVC-%CHANGE-POST: 6 %
FVC-%Pred-Post: 95 %
FVC-%Pred-Pre: 89 %
FVC-Post: 3.91 L
FVC-Pre: 3.67 L
POST FEV1/FVC RATIO: 88 %
PRE FEV6/FVC RATIO: 100 %
Post FEV6/FVC ratio: 100 %
Pre FEV1/FVC ratio: 89 %
RV % PRED: 108 %
RV: 1.85 L
TLC % PRED: 114 %
TLC: 6.32 L

## 2015-09-17 NOTE — Progress Notes (Signed)
PFT performed today. 

## 2015-09-17 NOTE — Progress Notes (Signed)
SMW performed today. 

## 2015-09-18 ENCOUNTER — Ambulatory Visit: Payer: Federal, State, Local not specified - PPO | Admitting: Oncology

## 2015-09-19 ENCOUNTER — Encounter: Payer: Self-pay | Admitting: Internal Medicine

## 2015-10-03 ENCOUNTER — Other Ambulatory Visit: Payer: Federal, State, Local not specified - PPO

## 2015-10-08 ENCOUNTER — Inpatient Hospital Stay: Payer: Federal, State, Local not specified - PPO

## 2015-10-08 ENCOUNTER — Inpatient Hospital Stay: Payer: Federal, State, Local not specified - PPO | Attending: Oncology | Admitting: Oncology

## 2015-10-08 ENCOUNTER — Ambulatory Visit (INDEPENDENT_AMBULATORY_CARE_PROVIDER_SITE_OTHER): Payer: Federal, State, Local not specified - PPO

## 2015-10-08 ENCOUNTER — Other Ambulatory Visit: Payer: Self-pay

## 2015-10-08 VITALS — BP 117/79 | HR 130 | Temp 98.1°F | Resp 18 | Wt 264.1 lb

## 2015-10-08 DIAGNOSIS — I2699 Other pulmonary embolism without acute cor pulmonale: Secondary | ICD-10-CM

## 2015-10-08 DIAGNOSIS — E785 Hyperlipidemia, unspecified: Secondary | ICD-10-CM | POA: Insufficient documentation

## 2015-10-08 DIAGNOSIS — Z86711 Personal history of pulmonary embolism: Secondary | ICD-10-CM

## 2015-10-08 DIAGNOSIS — R0600 Dyspnea, unspecified: Secondary | ICD-10-CM

## 2015-10-08 DIAGNOSIS — N319 Neuromuscular dysfunction of bladder, unspecified: Secondary | ICD-10-CM | POA: Insufficient documentation

## 2015-10-08 DIAGNOSIS — Z79899 Other long term (current) drug therapy: Secondary | ICD-10-CM | POA: Insufficient documentation

## 2015-10-08 DIAGNOSIS — I10 Essential (primary) hypertension: Secondary | ICD-10-CM | POA: Insufficient documentation

## 2015-10-08 DIAGNOSIS — Z7901 Long term (current) use of anticoagulants: Secondary | ICD-10-CM | POA: Insufficient documentation

## 2015-10-08 NOTE — Progress Notes (Signed)
Patient had an unprovoked PE on 08/20/15 and is here for coagulation work up.

## 2015-10-09 LAB — ANTITHROMBIN III: ANTITHROMB III FUNC: 118 % (ref 75–120)

## 2015-10-14 LAB — LUPUS ANTICOAGULANT PANEL
DRVVT: 43.8 s (ref 0.0–44.0)
PTT Lupus Anticoagulant: 33.8 s (ref 0.0–40.6)

## 2015-10-14 LAB — CARDIOLIPIN ANTIBODIES, IGG, IGM, IGA: Anticardiolipin IgA: <9 APL U/mL (ref 0–11)

## 2015-10-14 LAB — PROTHROMBIN GENE MUTATION

## 2015-10-14 LAB — BETA-2-GLYCOPROTEIN I ABS, IGG/M/A

## 2015-10-14 LAB — PROTEIN S ACTIVITY: PROTEIN S ACTIVITY: 107 % (ref 63–140)

## 2015-10-14 LAB — PROTEIN S, TOTAL: Protein S Ag, Total: 140 % (ref 60–150)

## 2015-10-14 LAB — HOMOCYSTEINE: Homocysteine: 9.7 umol/L (ref 0.0–15.0)

## 2015-10-14 LAB — PROTEIN C, TOTAL: PROTEIN C, TOTAL: 113 % (ref 60–150)

## 2015-10-14 LAB — FACTOR 5 LEIDEN

## 2015-10-14 LAB — PROTEIN C ACTIVITY: Protein C Activity: 144 % (ref 73–180)

## 2015-10-21 ENCOUNTER — Encounter: Payer: Self-pay | Admitting: Internal Medicine

## 2015-10-21 ENCOUNTER — Ambulatory Visit (INDEPENDENT_AMBULATORY_CARE_PROVIDER_SITE_OTHER): Payer: Federal, State, Local not specified - PPO | Admitting: Nurse Practitioner

## 2015-10-21 ENCOUNTER — Ambulatory Visit: Payer: Federal, State, Local not specified - PPO | Admitting: Nurse Practitioner

## 2015-10-21 ENCOUNTER — Ambulatory Visit (INDEPENDENT_AMBULATORY_CARE_PROVIDER_SITE_OTHER): Payer: Federal, State, Local not specified - PPO | Admitting: Internal Medicine

## 2015-10-21 ENCOUNTER — Encounter: Payer: Self-pay | Admitting: Nurse Practitioner

## 2015-10-21 VITALS — BP 124/68 | HR 85 | Ht 67.0 in | Wt 263.4 lb

## 2015-10-21 VITALS — BP 118/70 | HR 88 | Ht 67.0 in | Wt 263.0 lb

## 2015-10-21 DIAGNOSIS — I2699 Other pulmonary embolism without acute cor pulmonale: Secondary | ICD-10-CM | POA: Diagnosis not present

## 2015-10-21 DIAGNOSIS — J209 Acute bronchitis, unspecified: Secondary | ICD-10-CM | POA: Diagnosis not present

## 2015-10-21 DIAGNOSIS — L659 Nonscarring hair loss, unspecified: Secondary | ICD-10-CM

## 2015-10-21 DIAGNOSIS — E785 Hyperlipidemia, unspecified: Secondary | ICD-10-CM

## 2015-10-21 DIAGNOSIS — I10 Essential (primary) hypertension: Secondary | ICD-10-CM

## 2015-10-21 DIAGNOSIS — N319 Neuromuscular dysfunction of bladder, unspecified: Secondary | ICD-10-CM

## 2015-10-21 MED ORDER — LISINOPRIL 5 MG PO TABS: 5.0000 mg | ORAL_TABLET | Freq: Every day | ORAL | Status: DC

## 2015-10-21 NOTE — Patient Instructions (Signed)

## 2015-10-21 NOTE — Assessment & Plan Note (Signed)
Patient seen by pulmonology and was given the green light to only see hematology oncology. Patient will follow up again with Dr. Grayland Ormond in January. Continue Eliquis until this time. Patient is doing very well

## 2015-10-21 NOTE — Patient Instructions (Addendum)
See you in the summer or sooner if needed! Happy New Year!

## 2015-10-21 NOTE — Assessment & Plan Note (Signed)
We'll follow in 6 months

## 2015-10-21 NOTE — Assessment & Plan Note (Signed)
This is resolved

## 2015-10-21 NOTE — Assessment & Plan Note (Signed)
BP Readings from Last 3 Encounters:  10/21/15 118/70  10/21/15 124/68  10/08/15 117/79   Patient is stable we'll continue current regimen. Refills called in for 6 months to pharmacy.

## 2015-10-21 NOTE — Progress Notes (Signed)
Patient ID: Bridget Gould, female    DOB: [birth year]  Age: 39 y.o. MRN: [id number]  CC: No chief complaint on file.   HPI Bridget Gould presents for follow up PE.  1) Saw Dr. Mortimer Gould on 10/21/15 for f/u after PE of RLL Followed up with Dr. Grayland Gould for work up of hypercoagulopathy  Seeing him one more time in Jan.   Labs seem to be negative for any findings at this time  LMP Oct. Placed on Depo- provera until Jan and may be getting back onto [place] she reports.   History Bridget Gould has a past medical history of Hypertension; Neurogenic bladder; Alopecia; Hyperlipidemia; H/O blood clots; Blood transfusion without reported diagnosis (1978); Urine incontinence; Urinary tract infection; Alopecia; Neurogenic bladder; and Pulmonary embolism ([place]) (08/2015).   She has past surgical history that includes tumor removed; Dilation and curettage of uterus; and tumor (1978/2004).   Her family history includes Alcohol abuse in her sister; Arthritis in her father; Cancer in her maternal aunt and maternal grandfather; Diabetes in her paternal grandfather; Drug abuse in her sister; Heart disease in her father; Hyperlipidemia in her father and mother; Hypertension in her father; Mental illness in her sister.She reports that she has never smoked. She has never used smokeless tobacco. She reports that she drinks alcohol. She reports that she does not use illicit drugs.  Outpatient Prescriptions Prior to Visit  Medication Sig Dispense Refill  . apixaban (ELIQUIS) 5 MG TABS tablet Take 5 mg by mouth daily.    . medroxyPROGESTERone (DEPO-PROVERA) 150 MG/ML injection Inject 150 mg into the muscle every 3 (three) months.    . venlafaxine (EFFEXOR) 75 MG tablet Take 75 mg by mouth daily.    Marland Kitchen lisinopril (PRINIVIL,ZESTRIL) 5 MG tablet Take 5 mg by mouth daily.    Marland Kitchen sulfamethoxazole-trimethoprim (BACTRIM,SEPTRA) 400-80 MG tablet Take 1 tablet by mouth daily.    . norethindrone (CAMILA) 0.35 MG tablet Take 1 tablet  by mouth daily. Reported on 10/21/2015     No facility-administered medications prior to visit.    ROS Review of Systems  Constitutional: Negative for fever, chills, diaphoresis and fatigue.  Respiratory: Negative for chest tightness, shortness of breath and wheezing.   Cardiovascular: Negative for chest pain, palpitations and leg swelling.  Gastrointestinal: Negative for nausea, vomiting and diarrhea.  Skin: Negative for rash.  Neurological: Negative for dizziness, weakness, numbness and headaches.  Psychiatric/Behavioral: The patient is not nervous/anxious.     Objective:  BP 118/70 mmHg  Pulse 88  Ht 5\' 7"  (1.702 m)  Wt 263 lb (119.296 kg)  BMI 41.18 kg/m2  SpO2 97%  LMP 08/03/2015  Physical Exam  Constitutional: She is oriented to person, place, and time. She appears well-developed and well-nourished. No distress.  HENT:  Head: Normocephalic and atraumatic.  Right Ear: External ear normal.  Left Ear: External ear normal.  Alopecia  Cardiovascular: Normal rate and regular rhythm.  Exam reveals no gallop and no friction rub.   No murmur heard. Pulmonary/Chest: Effort normal and breath sounds normal. No respiratory distress. She has no wheezes. She has no rales. She exhibits no tenderness.  Neurological: She is alert and oriented to person, place, and time. No cranial nerve deficit. She exhibits normal muscle tone. Coordination normal.  Skin: Skin is warm and dry. No rash noted. She is not diaphoretic.  Psychiatric: She has a normal mood and affect. Her behavior is normal. Judgment and thought content normal.   Assessment & Plan:   Diagnoses and  all orders for this visit:  Other acute pulmonary embolism without acute cor pulmonale (HCC)  Acute bronchitis, unspecified organism  Alopecia  Neurogenic bladder  HLD (hyperlipidemia)  Benign essential HTN  Other orders -     lisinopril (PRINIVIL,ZESTRIL) 5 MG tablet; Take 1 tablet (5 mg total) by mouth daily.  I  have discontinued Ms. Freiermuth's sulfamethoxazole-trimethoprim. I have also changed her lisinopril. Additionally, I am having her maintain her norethindrone, venlafaxine, apixaban, and medroxyPROGESTERone.  Meds ordered this encounter  Medications  . lisinopril (PRINIVIL,ZESTRIL) 5 MG tablet    Sig: Take 1 tablet (5 mg total) by mouth daily.    Dispense:  30 tablet    Refill:  5    Order Specific Question:  Supervising Provider    Answer:  Crecencio Mc [[id number]]     Follow-up: Return in about 6 months (around 04/20/2016) for Follow up or CPE w/ fasting labs.

## 2015-10-21 NOTE — Assessment & Plan Note (Addendum)
Stable at this time 

## 2015-10-21 NOTE — Assessment & Plan Note (Signed)
Stable at this time 

## 2015-10-21 NOTE — Progress Notes (Signed)
  [facility] Consultation      Date: 10/21/2015,   MRN# [id number] Bridget Gould 1976-01-13 Code Status:  Hosp day:@LENGTHOFSTAYDAYS @ Referring MD: @ATDPROV @     PCP:      AdmissionWeight: 263 lb 6.4 oz (119.477 kg)                 CurrentWeight: 263 lb 6.4 oz (119.477 kg) Bridget Gould is a 39 y.o. old female seen in consultation for recent dx of PE.    CHIEF COMPLAINT:  Ct chest with PE, cough, SOB    HISTORY OF PRESENT ILLNESS  Patient follow up for PE, she feesl much better since last visit No SOB, no DOE, no chest pain  Results reviewed with patient ECHO WNL PFT's and 6MWT WNL CT chest from outside hospital  images reveiwed 10/21/2015  All other ROS negative     Current Medication:  Current outpatient prescriptions:  .  apixaban (ELIQUIS) 5 MG TABS tablet, Take 5 mg by mouth daily., Disp: , Rfl:  .  lisinopril (PRINIVIL,ZESTRIL) 5 MG tablet, Take 5 mg by mouth daily., Disp: , Rfl:  .  medroxyPROGESTERone (DEPO-PROVERA) 150 MG/ML injection, Inject 150 mg into the muscle every 3 (three) months., Disp: , Rfl:  .  norethindrone (CAMILA) 0.35 MG tablet, Take 1 tablet by mouth daily., Disp: , Rfl:  .  sulfamethoxazole-trimethoprim (BACTRIM,SEPTRA) 400-80 MG tablet, Take 1 tablet by mouth daily., Disp: , Rfl:  .  venlafaxine (EFFEXOR) 75 MG tablet, Take 75 mg by mouth daily., Disp: , Rfl:     ALLERGIES   Penicillins     REVIEW OF SYSTEMS   Review of Systems  Constitutional: Negative for fever, chills, weight loss and malaise/fatigue.  Respiratory: Negative for cough, hemoptysis, sputum production, shortness of breath and wheezing.   Cardiovascular: Negative for orthopnea and leg swelling.  Genitourinary: Positive for urgency and frequency.  Musculoskeletal: Positive for back pain.     VS: BP 124/68 mmHg  Pulse 85  Ht 5\' 7"  (1.702 m)  Wt 263 lb 6.4 oz (119.477 kg)  BMI 41.24 kg/m2  SpO2 97%     PHYSICAL EXAM  Physical  Exam  Constitutional: She is oriented to person, place, and time. She appears well-developed and well-nourished. No distress.  Cardiovascular: Normal rate, regular rhythm and normal heart sounds.   No murmur heard. Pulmonary/Chest: Effort normal and breath sounds normal. No respiratory distress. She has no wheezes.  Abdominal: Soft.  Musculoskeletal: She exhibits no edema.  Neurological: She is alert and oriented to person, place, and time.  Skin: Skin is warm. She is not diaphoretic.  Psychiatric: She has a normal mood and affect.        ASSESSMENT/PLAN   39 yo obese white female with recent dx of RLL Pulmonary embolism no symptoms at this time  Patient doing well with her SOB  Advised to follow up with Hem oNC for hypercoaguable work up      The Patient requires high complexity decision making for assessment and support, frequent evaluation and titration of therapies, application of advanced monitoring technologies and extensive interpretation of multiple databases.  Patient satisfied with Plan of action and management. All questions answered  Corrin Parker, M.D.  [facility]  Medical Director [place] Director [facility] Cardio-Pulmonary Department

## 2015-10-22 NOTE — Progress Notes (Signed)
[place]  [phone] [phone]  ID: Nilda Calamity OB: [birth year]  MR#: [id number]  [id number]  Patient Care Team: Rubbie Battiest, NP as PCP - General (Gerontology)  CHIEF COMPLAINT:  Chief Complaint  Patient presents with  . New Evaluation    hematoloby    INTERVAL HISTORY: Patient is a 39 year old female who presented with increasing shortness of breath and chest pain and found to have a pulmonary embolism in October 2016. No transient risk factors were identified at that time. She currently feels well and is asymptomatic. She is tolerating Eliquis without significant bleeding or bruising. She has no neurologic complaints. She denies any recent fevers or illnesses. She has a good appetite and denies weight loss. She has no chest pain or shortness of breath. She denies any nausea, vomiting, constipation, or diarrhea. She has no urinary complaints. Patient feels at her baseline and offers no specific complaints today.  REVIEW OF SYSTEMS:   Review of Systems  Constitutional: Negative for fever, weight loss and malaise/fatigue.  Respiratory: Negative.  Negative for shortness of breath.   Cardiovascular: Negative.  Negative for chest pain.  Gastrointestinal: Negative.   Musculoskeletal: Negative.   Neurological: Negative.   Endo/Heme/Allergies: Does not bruise/bleed easily.    As per HPI. Otherwise, a complete review of systems is negatve.  PAST MEDICAL HISTORY: Past Medical History  Diagnosis Date  . Hypertension   . Neurogenic bladder   . Alopecia   . Hyperlipidemia   . H/O blood clots   . Blood transfusion without reported diagnosis 1978  . Urine incontinence   . Urinary tract infection   . Alopecia   . Neurogenic bladder   . Pulmonary embolism ([place]) 08/2015    PAST SURGICAL HISTORY: Past Surgical History  Procedure Laterality Date  . Tumor removed    . Dilation and curettage of uterus    . Tumor  1978/2004    Base  of spinal cord    FAMILY HISTORY Family History  Problem Relation Age of Onset  . Hyperlipidemia Mother   . Arthritis Father   . Hyperlipidemia Father   . Heart disease Father   . Hypertension Father   . Alcohol abuse Sister   . Drug abuse Sister   . Mental illness Sister     BiPolar  . Cancer Maternal Aunt     Breast Cancer  . Cancer Maternal Grandfather     Colon/Prostate Cancer  . Diabetes Paternal Grandfather        ADVANCED DIRECTIVES:    HEALTH MAINTENANCE: Social History  Substance Use Topics  . Smoking status: Never Smoker   . Smokeless tobacco: Never Used  . Alcohol Use: 0.0 oz/week    0 Standard drinks or equivalent per week     Comment: 1 drink every other week      Colonoscopy:  PAP:  Bone density:  Lipid panel:  Allergies  Allergen Reactions  . Penicillins Rash    Current Outpatient Prescriptions  Medication Sig Dispense Refill  . apixaban (ELIQUIS) 5 MG TABS tablet Take 5 mg by mouth daily.    . medroxyPROGESTERone (DEPO-PROVERA) 150 MG/ML injection Inject 150 mg into the muscle every 3 (three) months.    . venlafaxine (EFFEXOR) 75 MG tablet Take 75 mg by mouth daily.    Marland Kitchen lisinopril (PRINIVIL,ZESTRIL) 5 MG tablet Take 1 tablet (5 mg total) by mouth daily. 30 tablet 5  . norethindrone (CAMILA) 0.35 MG tablet Take 1 tablet by mouth daily.  Reported on 10/21/2015     No current facility-administered medications for this visit.    OBJECTIVE: Filed Vitals:   10/08/15 1500  BP: 117/79  Pulse: 130  Temp: 98.1 F (36.7 C)  Resp: 18     Body mass index is 41.36 kg/(m^2).    ECOG FS:0 - Asymptomatic  General: Well-developed, well-nourished, no acute distress. Eyes: Pink conjunctiva, anicteric sclera. HEENT: Normocephalic, moist mucous membranes, clear oropharnyx. Lungs: Clear to auscultation bilaterally. Heart: Regular rate and rhythm. No rubs, murmurs, or gallops. Abdomen: Soft, nontender, nondistended. No organomegaly noted, normoactive  bowel sounds. Musculoskeletal: No edema, cyanosis, or clubbing. Neuro: Alert, answering all questions appropriately. Cranial nerves grossly intact. Skin: No rashes or petechiae noted. Psych: Normal affect. Lymphatics: No cervical, calvicular, axillary or inguinal LAD.   LAB RESULTS:  Lab Results  Component Value Date   NA 138 09/02/2015   K 4.5 09/02/2015   CL 107 09/02/2015   CO2 23 09/02/2015   GLUCOSE 79 09/02/2015   BUN 16 09/02/2015   CREATININE 0.91 09/02/2015   CALCIUM 9.5 09/02/2015   PROT 7.7 09/02/2015   ALBUMIN 4.1 09/02/2015   AST 17 09/02/2015   ALT 20 09/02/2015   ALKPHOS 119* 09/02/2015   BILITOT 0.2 09/02/2015   GFRNONAA >60 01/15/2014   GFRAA >60 01/15/2014    Lab Results  Component Value Date   WBC 10.4 09/02/2015   NEUTROABS 5.8 09/02/2015   HGB 13.5 09/02/2015   HCT 40.7 09/02/2015   MCV 95.1 09/02/2015   PLT 328.0 09/02/2015     STUDIES: No results found.  ASSESSMENT: Unprovoked pulmonary embolus.  PLAN:    1. Pulmonary embolism: Unclear etiology. Patient does not appear to have any transient risk factors. She has no significant family history of clotting. Hypercoagulable workup is negative. No intervention is needed at this time. Continue Eliquis for a total of 3 months based on 2016 Chest guidelines which are listed below. Patient will return to clinic in mid-January to discuss her laboratory results and possibly discontinuing anticoagulation.  "In patients with a proximal DVT of the leg or PE provoked by a nonsurgical transient risk factor, we recommend treatment with anticoagulation for 3 months over (i) treatment of a shorter period (Grade 1B) and (ii) treatment of a longer time-limited period (eg, 6, 12, or 24 months) (Grade 1B). We suggest treatment with anticoagulation for 3 months over extended therapy if there is a low or moderate bleeding risk (Grade 2B), and recommend treatment for 3 months over extended therapy if there is a high  risk of bleeding (Grade 1B)."  Patient expressed understanding and was in agreement with this plan. She also understands that She can call clinic at any time with any questions, concerns, or complaints.    Lloyd Huger, MD   10/22/2015 8:40 AM

## 2015-11-19 ENCOUNTER — Inpatient Hospital Stay: Payer: Federal, State, Local not specified - PPO | Attending: Oncology | Admitting: Oncology

## 2015-11-19 ENCOUNTER — Encounter: Payer: Self-pay | Admitting: Oncology

## 2015-11-19 VITALS — BP 114/81 | HR 87 | Temp 96.6°F | Resp 20 | Ht 67.0 in | Wt 268.7 lb

## 2015-11-19 DIAGNOSIS — Z7901 Long term (current) use of anticoagulants: Secondary | ICD-10-CM | POA: Diagnosis not present

## 2015-11-19 DIAGNOSIS — Z800 Family history of malignant neoplasm of digestive organs: Secondary | ICD-10-CM | POA: Diagnosis not present

## 2015-11-19 DIAGNOSIS — Z793 Long term (current) use of hormonal contraceptives: Secondary | ICD-10-CM | POA: Diagnosis not present

## 2015-11-19 DIAGNOSIS — N920 Excessive and frequent menstruation with regular cycle: Secondary | ICD-10-CM | POA: Diagnosis not present

## 2015-11-19 DIAGNOSIS — Z86718 Personal history of other venous thrombosis and embolism: Secondary | ICD-10-CM

## 2015-11-19 DIAGNOSIS — Z86711 Personal history of pulmonary embolism: Secondary | ICD-10-CM

## 2015-11-19 DIAGNOSIS — Z803 Family history of malignant neoplasm of breast: Secondary | ICD-10-CM | POA: Diagnosis not present

## 2015-11-19 DIAGNOSIS — Z8042 Family history of malignant neoplasm of prostate: Secondary | ICD-10-CM

## 2015-11-19 DIAGNOSIS — Z79899 Other long term (current) drug therapy: Secondary | ICD-10-CM | POA: Diagnosis not present

## 2015-11-19 DIAGNOSIS — I2699 Other pulmonary embolism without acute cor pulmonale: Secondary | ICD-10-CM

## 2015-11-19 NOTE — Progress Notes (Signed)
Patient here today for follow up Pulmonary Embolism. Denies any SOB or dizziness. States she has has constant vaginal bleeding x1 month, states not like menstrual cycle but moderate bleeding.

## 2015-11-22 NOTE — Progress Notes (Signed)
[place]  [phone] [phone]  ID: Bridget Gould OB: [birth year]  MR#: [id number]  [id number]  Patient Care Team: Rubbie Battiest, NP as PCP - General (Gerontology)  CHIEF COMPLAINT:  Chief Complaint  Patient presents with  . Pulmonary embolism    INTERVAL HISTORY: Patient returns to clinic today for further evaluation and discussion of her laboratory work. She continues to tolerate Eliquis although has noticed an increased in her menstrual bleeding. She recently started on birth control for better control. She has no neurologic complaints. She denies any recent fevers or illnesses. She has a good appetite and denies weight loss. She has no chest pain or shortness of breath. She denies any nausea, vomiting, constipation, or diarrhea. She has no urinary complaints. Patient offers no further specific complaints today.  REVIEW OF SYSTEMS:   Review of Systems  Constitutional: Negative for fever, weight loss and malaise/fatigue.  Respiratory: Negative.  Negative for shortness of breath.   Cardiovascular: Negative.  Negative for chest pain.  Gastrointestinal: Negative.   Musculoskeletal: Negative.   Neurological: Negative.   Endo/Heme/Allergies: Does not bruise/bleed easily.    As per HPI. Otherwise, a complete review of systems is negatve.  PAST MEDICAL HISTORY: Past Medical History  Diagnosis Date  . Hypertension   . Neurogenic bladder   . Alopecia   . Hyperlipidemia   . H/O blood clots   . Blood transfusion without reported diagnosis 1978  . Urine incontinence   . Urinary tract infection   . Alopecia   . Neurogenic bladder   . Pulmonary embolism ([place]) 08/2015    PAST SURGICAL HISTORY: Past Surgical History  Procedure Laterality Date  . Tumor removed    . Dilation and curettage of uterus    . 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 Hyperlipidemia  Mother   . Arthritis Father   . Hyperlipidemia Father   . Heart disease Father   . Hypertension Father   . Alcohol abuse Sister   . Drug abuse Sister   . Mental illness Sister     BiPolar  . Cancer Maternal Aunt     Breast Cancer  . Cancer Maternal Grandfather     Colon/Prostate Cancer  . Diabetes Paternal Grandfather        ADVANCED DIRECTIVES:    HEALTH MAINTENANCE: Social History  Substance Use Topics  . Smoking status: Never Smoker   . Smokeless tobacco: Never Used  . Alcohol Use: 0.0 oz/week    0 Standard drinks or equivalent per week     Comment: 1 drink every other week      Colonoscopy:  PAP:  Bone density:  Lipid panel:  Allergies  Allergen Reactions  . Penicillins Rash    Current Outpatient Prescriptions  Medication Sig Dispense Refill  . apixaban (ELIQUIS) 5 MG TABS tablet Take 5 mg by mouth daily.    Marland Kitchen lisinopril (PRINIVIL,ZESTRIL) 5 MG tablet Take 1 tablet (5 mg total) by mouth daily. 30 tablet 5  . norethindrone (CAMILA) 0.35 MG tablet Take 1 tablet by mouth daily. Reported on 10/21/2015    . sulfamethoxazole-trimethoprim (BACTRIM,SEPTRA) 400-80 MG tablet Take 0.5 tablets by mouth daily.  1  . venlafaxine (EFFEXOR) 75 MG tablet Take 75 mg by mouth daily.     No current facility-administered medications for this visit.    OBJECTIVE: Filed Vitals:   11/19/15 1547  BP: 114/81  Pulse: 87  Temp: 96.6 F (35.9 C)  Resp: 20     Body mass index is 42.08 kg/(m^2).    ECOG FS:0 - Asymptomatic  General: Well-developed, well-nourished, no acute distress. Eyes: Pink conjunctiva, anicteric sclera. HEENT: Alopecia Lungs: Clear to auscultation bilaterally. Heart: Regular rate and rhythm. No rubs, murmurs, or gallops. Abdomen: Soft, nontender, nondistended. No organomegaly noted, normoactive bowel sounds. Musculoskeletal: No edema, cyanosis, or clubbing. Neuro: Alert, answering all questions appropriately. Cranial nerves grossly intact. Skin: No  rashes or petechiae noted. Psych: Normal affect.   LAB RESULTS:  Lab Results  Component Value Date   NA 138 09/02/2015   K 4.5 09/02/2015   CL 107 09/02/2015   CO2 23 09/02/2015   GLUCOSE 79 09/02/2015   BUN 16 09/02/2015   CREATININE 0.91 09/02/2015   CALCIUM 9.5 09/02/2015   PROT 7.7 09/02/2015   ALBUMIN 4.1 09/02/2015   AST 17 09/02/2015   ALT 20 09/02/2015   ALKPHOS 119* 09/02/2015   BILITOT 0.2 09/02/2015   GFRNONAA >60 01/15/2014   GFRAA >60 01/15/2014    Lab Results  Component Value Date   WBC 10.4 09/02/2015   NEUTROABS 5.8 09/02/2015   HGB 13.5 09/02/2015   HCT 40.7 09/02/2015   MCV 95.1 09/02/2015   PLT 328.0 09/02/2015     STUDIES: No results found.  ASSESSMENT: Unprovoked pulmonary embolus.  PLAN:    1. Pulmonary embolism: Patient's entire hypercoagulable workup was negative or within normal limits. She also did not appear to have any transient risk factors. She has no significant family history of clotting. No intervention is needed at this time. Continue Eliquis for a total of 3 months based on 2016 Chest guidelines which are listed below. She will complete this in several weeks. No further follow-up has been scheduled. 2. Birth control: Patient was recently started on Reader which lists recurrent DVT as a risk factor and this was discussed with patient. Given her recent history of DVT and PE, recommend using different treatment. Patient expressed understanding of the increased risk and states she would like to continue with this treatment for the time being.     "In patients with a proximal DVT of the leg or PE provoked by a nonsurgical transient risk factor, we recommend treatment with anticoagulation for 3 months over (i) treatment of a shorter period (Grade 1B) and (ii) treatment of a longer time-limited period (eg, 6, 12, or 24 months) (Grade 1B). We suggest treatment with anticoagulation for 3 months over extended therapy if there is a low or  moderate bleeding risk (Grade 2B), and recommend treatment for 3 months over extended therapy if there is a high risk of bleeding (Grade 1B)."  Patient expressed understanding and was in agreement with this plan. She also understands that She can call clinic at any time with any questions, concerns, or complaints.    Lloyd Huger, MD   11/22/2015 11:53 AM

## 2015-12-17 ENCOUNTER — Ambulatory Visit: Payer: Federal, State, Local not specified - PPO | Admitting: Internal Medicine

## 2016-02-04 ENCOUNTER — Ambulatory Visit (INDEPENDENT_AMBULATORY_CARE_PROVIDER_SITE_OTHER): Payer: Federal, State, Local not specified - PPO

## 2016-02-04 ENCOUNTER — Ambulatory Visit (INDEPENDENT_AMBULATORY_CARE_PROVIDER_SITE_OTHER): Payer: Federal, State, Local not specified - PPO | Admitting: Podiatry

## 2016-02-04 ENCOUNTER — Encounter: Payer: Self-pay | Admitting: Podiatry

## 2016-02-04 VITALS — BP 110/74 | HR 77 | Resp 18

## 2016-02-04 DIAGNOSIS — M79673 Pain in unspecified foot: Secondary | ICD-10-CM | POA: Diagnosis not present

## 2016-02-04 DIAGNOSIS — R52 Pain, unspecified: Secondary | ICD-10-CM | POA: Diagnosis not present

## 2016-02-04 DIAGNOSIS — M722 Plantar fascial fibromatosis: Secondary | ICD-10-CM | POA: Diagnosis not present

## 2016-02-04 MED ORDER — MELOXICAM 15 MG PO TABS: 15.0000 mg | ORAL_TABLET | Freq: Every day | ORAL | Status: DC

## 2016-02-04 NOTE — Progress Notes (Signed)
   Subjective:    Patient ID: Bridget Gould, female    DOB: 04-18-1976, 40 y.o.   MRN: [id number]  HPI  40 year old female presents the office today for concerns of bilateral heel pain as well as pain and the arch her foot which is been ongoing for proximal knee 1 month the left side worse than the right. She describes as a throbbing sensation of the bottom of her heel. She denies any recent injury or trauma. She's been using orthotics however they've not been helping recently. She's had no recent treatment. She states it feels the same as it did previously when she was seeing Dr. Paulla Dolly but is moving into the arch. No recent injury or trauma. No swelling or redness. No tingling or numbness. The pain does not wake her up at night. Review of Systems  All other systems reviewed and are negative.      Objective:   Physical Exam General: AAO x3, NAD  Dermatological: Skin is warm, dry and supple bilateral. Nails x 10 are well manicured; remaining integument appears unremarkable at this time. There are no open sores, no preulcerative lesions, no rash or signs of infection present.  Vascular: Dorsalis Pedis artery and Posterior Tibial artery pedal pulses are 2/4 bilateral with immedate capillary fill time. Pedal hair growth present. No varicosities and no lower extremity edema present bilateral. There is no pain with calf compression, swelling, warmth, erythema.   Neruologic: Grossly intact via light touch bilateral. Vibratory intact via tuning fork bilateral. Protective threshold with Semmes Wienstein monofilament intact to all pedal sites bilateral. Patellar and Achilles deep tendon reflexes 2+ bilateral. No Babinski or clonus noted bilateral.   Musculoskeletal: Tenderness to palpation along the plantar medial tubercle of the calcaneus at the insertion of plantar fascia on the left >> right foot. There is mild pain along the course of the plantar fascia within the arch of the foot on the medial band  plantar fascia. Plantar fascia appears to be intact. There is no pain with lateral compression of the calcaneus or pain with vibratory sensation. There is no pain along the course or insertion of the achilles tendon. No other areas of tenderness to bilateral lower extremities. MMT 5/5, ROM WNL. Equinus present   Gait: Unassisted, Nonantalgic.      Assessment & Plan:  40 year old female bilateral heel pain, arch pain -Treatment options discussed including all alternatives, risks, and complications -X-rays were obtained and reviewed with the patient. No evidence of acute fracture stress pressure. Heel spurs are present. -Etiology of symptoms were discussed -Patient elects to proceed with steroid injection into the bilateral heels. Under sterile skin preparation, a total of 2.5cc of kenalog 10, 0.5% Marcaine plain, and 2% lidocaine plain were infiltrated into the symptomatic area without complication. A band-aid was applied. Patient tolerated the injection well without complication. Post-injection care with discussed with the patient. Discussed with the patient to ice the area over the next couple of days to help prevent a steroid flare.  -Prescribed mobic. Discussed side effects of the medication and directed to stop if any are to occur and call the office.  -Stretching icing daily. -Continue orthotics and shoe gear changes. -Dispensed night splint. -Follow-up in 3 weeks or sooner if any problems arise. In the meantime, encouraged to call the office with any questions, concerns, change in symptoms.   Celesta Gentile, DPM

## 2016-02-20 DIAGNOSIS — Z0000 Encounter for general adult medical examination without abnormal findings: Secondary | ICD-10-CM | POA: Diagnosis not present

## 2016-02-20 DIAGNOSIS — N3020 Other chronic cystitis without hematuria: Secondary | ICD-10-CM | POA: Diagnosis not present

## 2016-02-27 ENCOUNTER — Ambulatory Visit (INDEPENDENT_AMBULATORY_CARE_PROVIDER_SITE_OTHER): Payer: Federal, State, Local not specified - PPO | Admitting: Podiatry

## 2016-02-27 ENCOUNTER — Encounter: Payer: Self-pay | Admitting: Podiatry

## 2016-02-27 VITALS — BP 136/83 | HR 73 | Resp 18

## 2016-02-27 DIAGNOSIS — M722 Plantar fascial fibromatosis: Secondary | ICD-10-CM | POA: Diagnosis not present

## 2016-02-28 NOTE — Progress Notes (Signed)
Patient ID: Bridget Gould, female   DOB: 1976-05-13, 40 y.o.   MRN: [id number]  Subjective: 40 year old female presents the office in follow-up evaluation of bilateral heel pain, plantar fasciitis. She states of the right side is doing very well and she is not having any pain at this time. She still gets some occasional pain to the left side on the heel. She's been stretching, icing as well as one night splint. She does wear her orthotics alone the left heel has worn out some. Denies any systemic complaints such as fevers, chills, nausea, vomiting. No acute changes since last appointment, and no other complaints at this time.   Objective: AAO x3, NAD DP/PT pulses palpable bilaterally, CRT less than 3 seconds Protective sensation intact with Simms Weinstein monofilament Tenderness to palpation along the plantar medial tubercle of the calcaneus at the insertion of plantar fascia on the left foot. There is no pain to the right. There is no pain along the course of the plantar fascia within the arch of the foot. Plantar fascia appears to be intact. There is no pain with lateral compression of the calcaneus or pain with vibratory sensation. There is no pain along the course or insertion of the achilles tendon. No other areas of tenderness to bilateral lower extremities. No areas of pinpoint bony tenderness or pain with vibratory sensation. MMT 5/5, ROM WNL. No edema, erythema, increase in warmth to bilateral lower extremities.  No open lesions or pre-ulcerative lesions.  No pain with calf compression, swelling, warmth, erythema  Assessment: Resolving plantar fasciitis with continued pain on left side.  Plan: -All treatment options discussed with the patient including all alternatives, risks, complications.  -Patient elects to proceed with steroid injection into the left heel. Under sterile skin preparation, a total of 2.5cc of kenalog 10, 0.5% Marcaine plain, and 2% lidocaine plain were infiltrated  into the symptomatic area without complication. A band-aid was applied. Patient tolerated the injection well without complication. Post-injection care with discussed with the patient. Discussed with the patient to ice the area over the next couple of days to help prevent a steroid flare.  -Continue stretching, icing and shoe gear modifications. We'll send the left orthotic back for modification as it is rubbing with a cut out is in the heel to take pressure off this area. -Patient encouraged to call the office with any questions, concerns, change in symptoms.   Celesta Gentile, DPM

## 2016-03-03 ENCOUNTER — Telehealth: Payer: Self-pay | Admitting: *Deleted

## 2016-03-03 MED ORDER — MELOXICAM 15 MG PO TABS: 15.0000 mg | ORAL_TABLET | Freq: Every day | ORAL | Status: DC

## 2016-03-03 NOTE — Telephone Encounter (Signed)
[facility] faxed request for #90 Meloxicam 15mg .  Dr. Berton Lan with no refills.  Sent escribed to [facility].

## 2016-03-12 ENCOUNTER — Telehealth: Payer: Self-pay | Admitting: *Deleted

## 2016-03-12 NOTE — Telephone Encounter (Signed)
Called and left a message for the patient stating that her refurbished inserts are in and that she can pick up tomorrow or call and pick up at earliest convienence. Lattie Haw

## 2016-03-19 DIAGNOSIS — F4321 Adjustment disorder with depressed mood: Secondary | ICD-10-CM | POA: Diagnosis not present

## 2016-03-25 DIAGNOSIS — F4321 Adjustment disorder with depressed mood: Secondary | ICD-10-CM | POA: Diagnosis not present

## 2016-03-26 ENCOUNTER — Ambulatory Visit: Payer: Federal, State, Local not specified - PPO | Admitting: Podiatry

## 2016-04-01 DIAGNOSIS — F4321 Adjustment disorder with depressed mood: Secondary | ICD-10-CM | POA: Diagnosis not present

## 2016-04-08 DIAGNOSIS — F4321 Adjustment disorder with depressed mood: Secondary | ICD-10-CM | POA: Diagnosis not present

## 2016-04-14 DIAGNOSIS — F4323 Adjustment disorder with mixed anxiety and depressed mood: Secondary | ICD-10-CM | POA: Diagnosis not present

## 2016-04-21 ENCOUNTER — Encounter: Payer: Federal, State, Local not specified - PPO | Admitting: Nurse Practitioner

## 2016-04-23 ENCOUNTER — Encounter: Payer: Federal, State, Local not specified - PPO | Admitting: Nurse Practitioner

## 2016-04-23 ENCOUNTER — Encounter: Payer: Self-pay | Admitting: Family Medicine

## 2016-04-23 ENCOUNTER — Ambulatory Visit (INDEPENDENT_AMBULATORY_CARE_PROVIDER_SITE_OTHER): Payer: Federal, State, Local not specified - PPO | Admitting: Family Medicine

## 2016-04-23 VITALS — BP 129/73 | HR 76 | Temp 98.0°F | Ht 68.0 in | Wt 277.4 lb

## 2016-04-23 DIAGNOSIS — F419 Anxiety disorder, unspecified: Secondary | ICD-10-CM | POA: Insufficient documentation

## 2016-04-23 DIAGNOSIS — R5382 Chronic fatigue, unspecified: Secondary | ICD-10-CM

## 2016-04-23 DIAGNOSIS — Z0001 Encounter for general adult medical examination with abnormal findings: Secondary | ICD-10-CM | POA: Diagnosis not present

## 2016-04-23 DIAGNOSIS — R6889 Other general symptoms and signs: Secondary | ICD-10-CM

## 2016-04-23 DIAGNOSIS — N319 Neuromuscular dysfunction of bladder, unspecified: Secondary | ICD-10-CM | POA: Diagnosis not present

## 2016-04-23 DIAGNOSIS — Z1159 Encounter for screening for other viral diseases: Secondary | ICD-10-CM | POA: Diagnosis not present

## 2016-04-23 DIAGNOSIS — F4323 Adjustment disorder with mixed anxiety and depressed mood: Secondary | ICD-10-CM | POA: Diagnosis not present

## 2016-04-23 DIAGNOSIS — Z114 Encounter for screening for human immunodeficiency virus [HIV]: Secondary | ICD-10-CM | POA: Diagnosis not present

## 2016-04-23 DIAGNOSIS — F418 Other specified anxiety disorders: Secondary | ICD-10-CM

## 2016-04-23 DIAGNOSIS — Z23 Encounter for immunization: Secondary | ICD-10-CM | POA: Diagnosis not present

## 2016-04-23 DIAGNOSIS — F32A Depression, unspecified: Secondary | ICD-10-CM | POA: Insufficient documentation

## 2016-04-23 DIAGNOSIS — Z13220 Encounter for screening for lipoid disorders: Secondary | ICD-10-CM | POA: Diagnosis not present

## 2016-04-23 DIAGNOSIS — F329 Major depressive disorder, single episode, unspecified: Secondary | ICD-10-CM | POA: Insufficient documentation

## 2016-04-23 LAB — COMPREHENSIVE METABOLIC PANEL
ALBUMIN: 4.3 g/dL (ref 3.5–5.2)
ALK PHOS: 103 U/L (ref 39–117)
ALT: 17 U/L (ref 0–35)
AST: 17 U/L (ref 0–37)
BILIRUBIN TOTAL: 0.3 mg/dL (ref 0.2–1.2)
BUN: 19 mg/dL (ref 6–23)
CALCIUM: 9.2 mg/dL (ref 8.4–10.5)
CO2: 28 mEq/L (ref 19–32)
Chloride: 106 mEq/L (ref 96–112)
Creatinine, Ser: 0.99 mg/dL (ref 0.40–1.20)
GFR: 66.39 mL/min (ref >60.00)
Glucose, Bld: 91 mg/dL (ref 70–99)
Potassium: 4.3 mEq/L (ref 3.5–5.1)
Sodium: 137 mEq/L (ref 135–145)
TOTAL PROTEIN: 7.1 g/dL (ref 6.0–8.3)

## 2016-04-23 LAB — LIPID PANEL
Cholesterol: 198 mg/dL (ref 0–200)
HDL: 52.5 mg/dL (ref >39.00)
LDL Cholesterol: 135 mg/dL — ABNORMAL HIGH (ref 0–99)
NonHDL: 145.36
TRIGLYCERIDES: 50 mg/dL (ref 0.0–149.0)
Total CHOL/HDL Ratio: 4
VLDL: 10 mg/dL (ref 0.0–40.0)

## 2016-04-23 LAB — TSH: TSH: 1.38 u[IU]/mL (ref 0.35–4.50)

## 2016-04-23 LAB — CBC
HCT: 38.8 % (ref 36.0–46.0)
HEMOGLOBIN: 13.2 g/dL (ref 12.0–15.0)
MCHC: 34 g/dL (ref 30.0–36.0)
MCV: 94.1 fl (ref 78.0–100.0)
PLATELETS: 301 10*3/uL (ref 150.0–400.0)
RBC: 4.13 Mil/uL (ref 3.87–5.11)
RDW: 13.9 % (ref 11.5–15.5)
WBC: 9.1 10*3/uL (ref 4.0–10.5)

## 2016-04-23 LAB — HEMOGLOBIN A1C: Hgb A1c MFr Bld: 5.5 % (ref 4.6–6.5)

## 2016-04-23 NOTE — Assessment & Plan Note (Signed)
Patient with several months of anxiety and depression. Suspect this is contributing to her weight gain and tiredness/fatigue. Discussed treatment options including continuing to see a counselor and altering her medication regimen. She wants to await lab results to rule out other causes. Labs as outlined below. She will continue to monitor.

## 2016-04-23 NOTE — Assessment & Plan Note (Signed)
See problem based charting for documentation on anxiety and depression. BMI of 42. Discussed diet and exercise. We'll order lab work as outlined below to evaluate for cause of increased weight/fatigue. Treat anxiety and depression if labwork negative. She has follow-up with gynecology next month for gynecologic exam. Tetanus vaccination was brought up to date. HIV will be ordered.

## 2016-04-23 NOTE — Progress Notes (Signed)
Patient ID: Bridget Gould, female   DOB: 07/23/1976, 40 y.o.   MRN: [id number]  [name], MD Phone: [phone]  Bridget Gould is a 40 y.o. female who presents today for physical exam.  Patient presents for physical exam. Notes anxiety and depression is an issue to discuss today.  Anxiety/depression: Patient notes over the last 7-8 months she has not felt like herself. She's been grumpy, weepy, and snappy. Feels depressed and anxious. Has been seeing a counselor and that has been of benefit. She is on Effexor right now and has been on this for about 8 years. No SI. She does note feeling tired with this and having some weight gain. No physical fatigue though just feels fatigued. Has been able to exercise with no issues.  She reports a history of chronic UTIs that are followed by urology. She is on chronic Bactrim. Does note small amounts of urine leakage, frequent urination, and starting and stopping stream that are stable and unchanged. No dysuria. No fevers. Reports this issue is at baseline.  Exercises 4-5 times a week. 30 minutes at a time period is doing triathlons and half marathons. Also has run 5K's recently. Continuing to put weight on. She is following a diet recommended by nutritionist several years ago. Mostly meat and veggies. Limits carbs and sugars. Doing portion control. Pap smear completed July 2016 per her report. States it was normal. As done by gynecology. Last menstrual period was May 28. Has periods monthly. Does self breast exams. Tetanus vaccine not up-to-date. No prior HIV testing. No tobacco use. 2 alcoholic drinks a week. No illicit drug use. Has an aunt that had breast cancer at 71 and a maternal grandfather that had colon cancer at 17.   Active Ambulatory Problems    Diagnosis Date Noted  . Pulmonary embolism without acute cor pulmonale ([place]) 09/02/2015  . Acute bronchitis 09/02/2015  . Neurogenic bladder 09/02/2015  . Alopecia 09/02/2015  . Benign  essential HTN 09/02/2015  . HLD (hyperlipidemia) 09/02/2015  . Plantar fasciitis 02/04/2016  . Arch pain 02/04/2016  . Anxiety and depression 04/23/2016  . Encounter for general adult medical examination with abnormal findings 04/23/2016   Resolved Ambulatory Problems    Diagnosis Date Noted  . No Resolved Ambulatory Problems   Past Medical History  Diagnosis Date  . Hypertension   . Hyperlipidemia   . H/O blood clots   . Blood transfusion without reported diagnosis 1978  . Urine incontinence   . Urinary tract infection   . Pulmonary embolism ([place]) 08/2015    Family History  Problem Relation Age of Onset  . Hyperlipidemia Mother   . Arthritis Father   . Hyperlipidemia Father   . Heart disease Father   . Hypertension Father   . Alcohol abuse Sister   . Drug abuse Sister   . Mental illness Sister     BiPolar  . Cancer Maternal Aunt     Breast Cancer  . Cancer Maternal Grandfather     Colon/Prostate Cancer  . Diabetes Paternal Grandfather     Social History   Social History  . Marital Status: Married    Spouse Name: N/A  . Number of Children: N/A  . Years of Education: N/A   Occupational History  . Not on file.   Social History Main Topics  . Smoking status: Never Smoker   . Smokeless tobacco: Never Used  . Alcohol Use: 0.0 oz/week    0 Standard drinks or equivalent per week  Comment: 1 drink every other week   . Drug Use: No  . Sexual Activity:    Partners: Male     Comment: Husband    Other Topics Concern  . 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    No children   5 dogs   Caffeine- Coffee 1 cup daily, tea- depends, soda every other day    ROS  General:  Negative for nexplained weight loss, fever Skin: Negative for new or changing mole, sore that won't heal HEENT: Negative for trouble hearing, trouble seeing, ringing in ears, mouth sores,  hoarseness, change in voice, dysphagia. CV:  Negative for chest pain, dyspnea, edema, palpitations Resp: Negative for cough, dyspnea, hemoptysis GI: Negative for nausea, vomiting, diarrhea, constipation, abdominal pain, melena, hematochezia. GU: Positive for incontinence, urinary hesitancy, and urinary frequency, Negative for dysuria, hematuria, vaginal or penile discharge, polyuria, sexual difficulty, lumps in testicle or breasts MSK: Negative for muscle cramps or aches, joint pain or swelling Neuro: Negative for headaches, weakness, numbness, dizziness, passing out/fainting Psych: Positive for anxiety, Negative for depression, memory problems  Objective  Physical Exam Filed Vitals:   04/23/16 0850  BP: 129/73  Pulse: 76  Temp: 98 F (36.7 C)    BP Readings from Last 3 Encounters:  04/23/16 129/73  02/27/16 136/83  02/04/16 110/74   Wt Readings from Last 3 Encounters:  04/23/16 277 lb 6.4 oz (125.828 kg)  11/19/15 268 lb 11.9 oz (121.9 kg)  10/21/15 263 lb (119.296 kg)    Physical Exam  Constitutional: She is well-developed, well-nourished, and in no distress.  HENT:  Head: Normocephalic and atraumatic.  Right Ear: External ear normal.  Left Ear: External ear normal.  Alopecia noted  Eyes: Conjunctivae are normal. Pupils are equal, round, and reactive to light.  Cardiovascular: Normal rate, regular rhythm and normal heart sounds.   Pulmonary/Chest: Effort normal and breath sounds normal.  Abdominal: Soft. Bowel sounds are normal. She exhibits no distension. There is no tenderness. There is no rebound and no guarding.  Musculoskeletal: She exhibits no edema.  Neurological: She is alert. Gait normal.  Skin: Skin is warm and dry. She is not diaphoretic.  Psychiatric:  Mood anxious and depressed, affect intermittently happy and laughing though tears up at times     Assessment/Plan:   Neurogenic bladder Stable. Follows with urology. On chronic Bactrim. She'll continue  this. Continue to follow with urology.  Anxiety and depression Patient with several months of anxiety and depression. Suspect this is contributing to her weight gain and tiredness/fatigue. Discussed treatment options including continuing to see a counselor and altering her medication regimen. She wants to await lab results to rule out other causes. Labs as outlined below. She will continue to monitor.  Encounter for general adult medical examination with abnormal findings See problem based charting for documentation on anxiety and depression. BMI of 42. Discussed diet and exercise. We'll order lab work as outlined below to evaluate for cause of increased weight/fatigue. Treat anxiety and depression if labwork negative. She has follow-up with gynecology next month for gynecologic exam. Tetanus vaccination was brought up to date. HIV will be ordered.    Orders Placed This Encounter  Procedures  . CBC  . Comp Met (CMET)  . Lipid Profile  . TSH  . HIV antibody (with reflex)  . HgB A1c    Randall Hiss  Caryl Bis, MD [place]

## 2016-04-23 NOTE — Patient Instructions (Signed)
Nice to meet you. We are going to check some lab work to make sure there is no underlying cause for your weight gain and fatigue. These things are most likely related to anxiety and depression and if your lab work is negative for cause we will consider altering your medication regimen. Please continue diet and exercise. Please keep follow-up with gynecologist.

## 2016-04-23 NOTE — Assessment & Plan Note (Signed)
Stable. Follows with urology. On chronic Bactrim. She'll continue this. Continue to follow with urology.

## 2016-04-23 NOTE — Progress Notes (Signed)
Pre visit review using our clinic review tool, if applicable. No additional management support is needed unless otherwise documented below in the visit note. 

## 2016-04-24 LAB — HIV ANTIBODY (ROUTINE TESTING W REFLEX): HIV: NONREACTIVE

## 2016-04-29 DIAGNOSIS — F4323 Adjustment disorder with mixed anxiety and depressed mood: Secondary | ICD-10-CM | POA: Diagnosis not present

## 2016-05-06 ENCOUNTER — Encounter: Payer: Self-pay | Admitting: Family Medicine

## 2016-05-09 ENCOUNTER — Other Ambulatory Visit: Payer: Self-pay | Admitting: Nurse Practitioner

## 2016-05-14 DIAGNOSIS — F330 Major depressive disorder, recurrent, mild: Secondary | ICD-10-CM | POA: Diagnosis not present

## 2016-05-19 ENCOUNTER — Other Ambulatory Visit: Payer: Self-pay | Admitting: Family Medicine

## 2016-05-19 MED ORDER — VENLAFAXINE HCL ER 150 MG PO CP24: 150.0000 mg | ORAL_CAPSULE | Freq: Every day | ORAL | Status: DC

## 2016-05-20 DIAGNOSIS — F4323 Adjustment disorder with mixed anxiety and depressed mood: Secondary | ICD-10-CM | POA: Diagnosis not present

## 2016-05-25 ENCOUNTER — Ambulatory Visit (INDEPENDENT_AMBULATORY_CARE_PROVIDER_SITE_OTHER): Payer: Federal, State, Local not specified - PPO | Admitting: Family Medicine

## 2016-05-25 ENCOUNTER — Encounter: Payer: Self-pay | Admitting: Family Medicine

## 2016-05-25 DIAGNOSIS — F418 Other specified anxiety disorders: Secondary | ICD-10-CM | POA: Diagnosis not present

## 2016-05-25 DIAGNOSIS — F32A Depression, unspecified: Secondary | ICD-10-CM

## 2016-05-25 DIAGNOSIS — F419 Anxiety disorder, unspecified: Secondary | ICD-10-CM

## 2016-05-25 DIAGNOSIS — I10 Essential (primary) hypertension: Secondary | ICD-10-CM

## 2016-05-25 DIAGNOSIS — F329 Major depressive disorder, single episode, unspecified: Secondary | ICD-10-CM

## 2016-05-25 MED ORDER — LIRAGLUTIDE -WEIGHT MANAGEMENT 18 MG/3ML ~~LOC~~ SOPN: 0.6000 mg | PEN_INJECTOR | Freq: Every day | SUBCUTANEOUS | 1 refills | Status: DC

## 2016-05-25 NOTE — Progress Notes (Signed)
  [name], MD Phone: [phone]  Bridget Gould is a 40 y.o. female who presents today for follow-up.  Depression/anxiety: Patient notes this is stable. Recently increased her Effexor to 150 mg daily. Has not noticed a difference though it is only been 3-4 days. Continues to see her counselor. No SI.  Obesity: Patient has tried seeing a nutritionist and working on her diet. Exercises consistently 4-5 days a week for 30-45 minutes at a time. Has not had any weight loss with this. She has no interest in weight loss surgery. No history in her family or herself of thyroid cancer, parathyroid cancer, or adrenal cancer. She does have hypertension.  HYPERTENSION  Disease Monitoring  Home BP Monitoring not checking Chest pain- no    Dyspnea- no Medications  Compliance-  taking lisinopril.   Edema- no   PMH: nonsmoker.   ROS see history of present illness  Objective  Physical Exam Vitals:   05/25/16 0850  BP: 116/78  Pulse: 77  Temp: 98.6 F (37 C)    BP Readings from Last 3 Encounters:  05/25/16 116/78  04/23/16 129/73  02/27/16 136/83   Wt Readings from Last 3 Encounters:  05/25/16 280 lb (127 kg)  04/23/16 277 lb 6.4 oz (125.8 kg)  11/19/15 268 lb 11.9 oz (121.9 kg)    Physical Exam  Constitutional: She is well-developed, well-nourished, and in no distress.  HENT:  Head: Normocephalic and atraumatic.  Cardiovascular: Normal rate, regular rhythm and normal heart sounds.   Pulmonary/Chest: Effort normal and breath sounds normal.  Neurological: She is alert. Gait normal.  Skin: Skin is warm and dry.     Assessment/Plan: Please see individual problem list.  Benign essential HTN At goal. Continue lisinopril.  Anxiety and depression Unchanged. Recently increased Effexor. She will continue to see her counselor and continue to monitor with the increased dose of Effexor. Given return precautions.  Morbid obesity ([place]) Patient has continued to gain weight  despite dietary changes and exercise. Sees a nutritionist and exercises 4-5 days a week. Not interested in weight loss surgery at this time. Discussed medication options including phentermine and Saxenda. Phentermine would not likely be a good option for her given blood pressure issues and recent increase in Effexor. We will trial Saxenda. She was given directions to increase on a weekly basis at 0.6 mg increments. She will continue diet and exercise. Discussed hypoglycemic symptoms. She will monitor and if these occur will eat something and let us know. Given patient information on Saxenda. She's given return precautions.   No orders of the defined types were placed in this encounter.   Meds ordered this encounter  Medications  . DISCONTD: Liraglutide -Weight Management (SAXENDA) 18 MG/3ML SOPN    Sig: Inject 0.6 mg into the skin daily.    Dispense:  18 mL    Refill:  1  . Liraglutide -Weight Management (SAXENDA) 18 MG/3ML SOPN    Sig: Inject 0.6 mg into the skin daily. First week dose: 0.6 mg SQ daily. Second week dose: 1.2 mg SQ daily. Third week dose: 1.8 mg SQ daily. Fourth week dose: 2.4 mg SQ  daily. Fifth week dose: 3 mg SQ daily.    Dispense:  18 mL    Refill:  [place], MD [place]

## 2016-05-25 NOTE — Assessment & Plan Note (Signed)
Patient has continued to gain weight despite dietary changes and exercise. Sees a nutritionist and exercises 4-5 days a week. Not interested in weight loss surgery at this time. Discussed medication options including phentermine and Saxenda. Phentermine would not likely be a good option for her given blood pressure issues and recent increase in Effexor. We will trial Saxenda. She was given directions to increase on a weekly basis at 0.6 mg increments. She will continue diet and exercise. Discussed hypoglycemic symptoms. She will monitor and if these occur will eat something and let us know. Given patient information on Saxenda. She's given return precautions.

## 2016-05-25 NOTE — Assessment & Plan Note (Signed)
At goal. Continue lisinopril.  

## 2016-05-25 NOTE — Assessment & Plan Note (Signed)
Unchanged. Recently increased Effexor. She will continue to see her counselor and continue to monitor with the increased dose of Effexor. Given return precautions.

## 2016-05-25 NOTE — Patient Instructions (Addendum)
Nice to see you. Please continue your Effexor. We will start you on Saxenda for weight loss. He will start out at 0.6 mg daily for one week and then increase by 0.6 mg on a weekly basis until you reach 3 mg. First week dose: 0.6 mg daily. Second week dose: 1.2 mg daily. Third week dose: 1.8 mg daily. Fourth week dose: 2.4 mg daily. Fifth week dose: 3 mg daily. If you develop signs of hypoglycemia such as sweatiness, shakiness, tremors please eat something and let us know. If you develop thoughts of harming herself, sweatiness, shakiness, tremors, or any new or changing symptoms please seek medical attention.

## 2016-06-04 DIAGNOSIS — F4323 Adjustment disorder with mixed anxiety and depressed mood: Secondary | ICD-10-CM | POA: Diagnosis not present

## 2016-06-05 ENCOUNTER — Encounter: Payer: Self-pay | Admitting: Family Medicine

## 2016-06-09 ENCOUNTER — Encounter: Payer: Self-pay | Admitting: Family Medicine

## 2016-06-11 ENCOUNTER — Other Ambulatory Visit: Payer: Self-pay | Admitting: Family Medicine

## 2016-06-12 ENCOUNTER — Encounter: Payer: Self-pay | Admitting: Family Medicine

## 2016-06-18 DIAGNOSIS — F4323 Adjustment disorder with mixed anxiety and depressed mood: Secondary | ICD-10-CM | POA: Diagnosis not present

## 2016-06-25 DIAGNOSIS — F4323 Adjustment disorder with mixed anxiety and depressed mood: Secondary | ICD-10-CM | POA: Diagnosis not present

## 2016-06-27 ENCOUNTER — Other Ambulatory Visit: Payer: Self-pay | Admitting: Podiatry

## 2016-07-08 ENCOUNTER — Ambulatory Visit: Payer: Federal, State, Local not specified - PPO | Admitting: Family Medicine

## 2016-07-09 DIAGNOSIS — F321 Major depressive disorder, single episode, moderate: Secondary | ICD-10-CM | POA: Diagnosis not present

## 2016-07-10 ENCOUNTER — Other Ambulatory Visit: Payer: Self-pay | Admitting: Surgical

## 2016-07-10 MED ORDER — LISINOPRIL 5 MG PO TABS: 5.0000 mg | ORAL_TABLET | Freq: Every day | ORAL | 2 refills | Status: DC

## 2016-07-15 ENCOUNTER — Other Ambulatory Visit: Payer: Self-pay | Admitting: Surgical

## 2016-07-15 MED ORDER — LISINOPRIL 5 MG PO TABS: 5.0000 mg | ORAL_TABLET | Freq: Every day | ORAL | 1 refills | Status: DC

## 2016-08-27 DIAGNOSIS — Z124 Encounter for screening for malignant neoplasm of cervix: Secondary | ICD-10-CM | POA: Diagnosis not present

## 2016-08-27 DIAGNOSIS — Z3041 Encounter for surveillance of contraceptive pills: Secondary | ICD-10-CM | POA: Diagnosis not present

## 2016-08-27 DIAGNOSIS — Z1151 Encounter for screening for human papillomavirus (HPV): Secondary | ICD-10-CM | POA: Diagnosis not present

## 2016-08-27 DIAGNOSIS — Z01419 Encounter for gynecological examination (general) (routine) without abnormal findings: Secondary | ICD-10-CM | POA: Diagnosis not present

## 2016-09-08 DIAGNOSIS — K080 Exfoliation of teeth due to systemic causes: Secondary | ICD-10-CM | POA: Diagnosis not present

## 2016-09-28 ENCOUNTER — Other Ambulatory Visit: Payer: Self-pay | Admitting: Family Medicine

## 2016-10-22 DIAGNOSIS — F321 Major depressive disorder, single episode, moderate: Secondary | ICD-10-CM | POA: Diagnosis not present

## 2016-11-12 ENCOUNTER — Other Ambulatory Visit: Payer: Self-pay | Admitting: Family Medicine

## 2016-11-12 NOTE — Telephone Encounter (Signed)
Refill sent in to pharmacy. Patient needs follow-up in the next 1-2 months.

## 2016-11-12 NOTE — Telephone Encounter (Signed)
Last seen 05/25/16 last filled 05/19/16 90 1rf

## 2016-11-12 NOTE — Telephone Encounter (Signed)
lmtrc

## 2016-11-26 DIAGNOSIS — F321 Major depressive disorder, single episode, moderate: Secondary | ICD-10-CM | POA: Diagnosis not present

## 2016-12-03 NOTE — Telephone Encounter (Signed)
Unable to reach, needs f/u for further refills

## 2016-12-10 ENCOUNTER — Encounter: Payer: Self-pay | Admitting: Family Medicine

## 2016-12-22 ENCOUNTER — Encounter: Payer: Self-pay | Admitting: Family Medicine

## 2016-12-24 DIAGNOSIS — F325 Major depressive disorder, single episode, in full remission: Secondary | ICD-10-CM | POA: Diagnosis not present

## 2017-02-15 ENCOUNTER — Other Ambulatory Visit: Payer: Self-pay | Admitting: Family Medicine

## 2017-02-15 NOTE — Telephone Encounter (Signed)
Last OV 05/25/16 last filled 11/12/16, no office visit scheduled

## 2017-02-15 NOTE — Telephone Encounter (Signed)
Sent to pharmacy. Patient needs follow-up set up.

## 2017-02-16 NOTE — Telephone Encounter (Signed)
Left message to return call 

## 2017-02-22 NOTE — Telephone Encounter (Signed)
Sent my chart to notify

## 2017-03-16 DIAGNOSIS — K080 Exfoliation of teeth due to systemic causes: Secondary | ICD-10-CM | POA: Diagnosis not present

## 2017-03-18 ENCOUNTER — Other Ambulatory Visit: Payer: Self-pay | Admitting: Family Medicine

## 2017-04-26 ENCOUNTER — Encounter: Payer: Self-pay | Admitting: Family Medicine

## 2017-04-26 ENCOUNTER — Ambulatory Visit (INDEPENDENT_AMBULATORY_CARE_PROVIDER_SITE_OTHER): Payer: Federal, State, Local not specified - PPO | Admitting: Family Medicine

## 2017-04-26 VITALS — BP 110/64 | HR 71 | Temp 98.5°F | Ht 68.0 in | Wt 280.8 lb

## 2017-04-26 DIAGNOSIS — Z13220 Encounter for screening for lipoid disorders: Secondary | ICD-10-CM

## 2017-04-26 DIAGNOSIS — Z1329 Encounter for screening for other suspected endocrine disorder: Secondary | ICD-10-CM

## 2017-04-26 DIAGNOSIS — Z0001 Encounter for general adult medical examination with abnormal findings: Secondary | ICD-10-CM | POA: Insufficient documentation

## 2017-04-26 DIAGNOSIS — Z1211 Encounter for screening for malignant neoplasm of colon: Secondary | ICD-10-CM | POA: Insufficient documentation

## 2017-04-26 DIAGNOSIS — Z0000 Encounter for general adult medical examination without abnormal findings: Secondary | ICD-10-CM

## 2017-04-26 DIAGNOSIS — Z130 Encounter for screening for diseases of the blood and blood-forming organs and certain disorders involving the immune mechanism: Secondary | ICD-10-CM | POA: Diagnosis not present

## 2017-04-26 LAB — LIPID PANEL
CHOLESTEROL: 182 mg/dL (ref 0–200)
HDL: 49.3 mg/dL (ref >39.00)
LDL Cholesterol: 119 mg/dL — ABNORMAL HIGH (ref 0–99)
NonHDL: 132.58
Total CHOL/HDL Ratio: 4
Triglycerides: 66 mg/dL (ref 0.0–149.0)
VLDL: 13.2 mg/dL (ref 0.0–40.0)

## 2017-04-26 LAB — CBC
HEMATOCRIT: 38.9 % (ref 36.0–46.0)
HEMOGLOBIN: 13.1 g/dL (ref 12.0–15.0)
MCHC: 33.7 g/dL (ref 30.0–36.0)
MCV: 96.7 fl (ref 78.0–100.0)
Platelets: 298 10*3/uL (ref 150.0–400.0)
RBC: 4.02 Mil/uL (ref 3.87–5.11)
RDW: 13.3 % (ref 11.5–15.5)
WBC: 8.8 10*3/uL (ref 4.0–10.5)

## 2017-04-26 LAB — COMPREHENSIVE METABOLIC PANEL
ALBUMIN: 4.3 g/dL (ref 3.5–5.2)
ALK PHOS: 105 U/L (ref 39–117)
ALT: 19 U/L (ref 0–35)
AST: 20 U/L (ref 0–37)
BUN: 11 mg/dL (ref 6–23)
CO2: 27 mEq/L (ref 19–32)
CREATININE: 0.94 mg/dL (ref 0.40–1.20)
Calcium: 9 mg/dL (ref 8.4–10.5)
Chloride: 106 mEq/L (ref 96–112)
GFR: 70.12 mL/min (ref >60.00)
Glucose, Bld: 94 mg/dL (ref 70–99)
Potassium: 4 mEq/L (ref 3.5–5.1)
SODIUM: 138 meq/L (ref 135–145)
TOTAL PROTEIN: 6.9 g/dL (ref 6.0–8.3)
Total Bilirubin: 0.4 mg/dL (ref 0.2–1.2)

## 2017-04-26 LAB — TSH: TSH: 2.3 u[IU]/mL (ref 0.35–4.50)

## 2017-04-26 LAB — HEMOGLOBIN A1C: Hgb A1c MFr Bld: 5.7 % (ref 4.6–6.5)

## 2017-04-26 NOTE — Assessment & Plan Note (Signed)
Physical exam completed. Pelvic exam and breast exam deferred to her gynecologist. Encouraged continued exercise. Encouraged her to continue to follow with the bariatric clinic. Did discuss possibility of increasing blood pressure and palpitations with phentermine. She'll continue to follow the bariatric clinic for this. Discussed risk of lisinopril if she were to get pregnant. She's been on this for many years. She notes she has no plans to get pregnant and has discussed this with her gynecologist as well. Currently on birth control. Lab work as outlined below.

## 2017-04-26 NOTE — Patient Instructions (Signed)
Nice to see you. Please continue to work on diet and exercise and continue to follow with the bariatric clinic. Please keep your appointment with gynecology next month. We'll obtain lab work today and contact you with the results.

## 2017-04-26 NOTE — Progress Notes (Signed)
[name], MD Phone: [phone]  Bridget Gould is a 41 y.o. female who presents today for physical exam.  Exercises 30 minutes 5 days a week. Has started seeing a bariatric clinic. Currently on phentermine. Is doing a low sodium diet. Pap smear up-to-date and breast exams up-to-date per her report. Completed through gynecology. Is due for a visit next month. Has monthly menstrual cycle lasting 7 days. No intermenstrual bleeding. Tetanus vaccination today. HIV up-to-date. Family history of colon cancer in her maternal grand father. No first-degree relative with colon cancer. Maternal aunt with breast cancer.  Active Ambulatory Problems    Diagnosis Date Noted  . Pulmonary embolism without acute cor pulmonale ([place]) 09/02/2015  . Acute bronchitis 09/02/2015  . Neurogenic bladder 09/02/2015  . Alopecia 09/02/2015  . Benign essential HTN 09/02/2015  . HLD (hyperlipidemia) 09/02/2015  . Plantar fasciitis 02/04/2016  . Arch pain 02/04/2016  . Anxiety and depression 04/23/2016  . Morbid obesity ([place]) 05/25/2016  . Routine general medical examination at a health care facility 04/26/2017   Resolved Ambulatory Problems    Diagnosis Date Noted  . Encounter for general adult medical examination with abnormal findings 04/23/2016   Past Medical History:  Diagnosis Date  . Alopecia   . Alopecia   . Blood transfusion without reported diagnosis 1978  . H/O blood clots   . Hyperlipidemia   . Hypertension   . Neurogenic bladder   . Neurogenic bladder   . Pulmonary embolism ([place]) 08/2015  . Urinary tract infection   . Urine incontinence     Family History  Problem Relation Age of Onset  . Hyperlipidemia Mother   . Arthritis Father   . Hyperlipidemia Father   . Heart disease Father   . Hypertension Father   . Cancer Maternal Grandfather        Colon/Prostate Cancer  . Diabetes Paternal Grandfather   . Alcohol abuse Sister   . Drug abuse Sister   . Mental illness Sister          BiPolar  . Cancer Maternal Aunt        Breast Cancer    Social History   Social History  . Marital status: Married    Spouse name: N/A  . Number of children: N/A  . Years of education: N/A   Occupational History  . Not on file.   Social History Main Topics  . Smoking status: Never Smoker  . Smokeless tobacco: Never Used  . Alcohol use 0.0 oz/week     Comment: 1 drink every other week   . Drug use: No  . Sexual activity: Yes    Partners: Male     Comment: Husband    Other Topics Concern  . 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    No children   5 dogs   Caffeine- Coffee 1 cup daily, tea- depends, soda every other day    ROS  General:  Negative for nexplained weight loss, fever Skin: Negative for new or changing mole, sore that won't heal HEENT: Negative for trouble hearing, trouble seeing, ringing in ears, mouth sores, hoarseness, change in voice, dysphagia. CV:  Negative for chest pain, dyspnea, edema, palpitations Resp: Negative for cough, dyspnea, hemoptysis GI: Negative for nausea, vomiting, diarrhea, constipation, abdominal pain, melena, hematochezia. GU: Negative for dysuria, incontinence, urinary hesitance, hematuria, vaginal or penile discharge,  polyuria, sexual difficulty, lumps in testicle or breasts MSK: Negative for muscle cramps or aches, joint pain or swelling Neuro: Negative for headaches, weakness, numbness, dizziness, passing out/fainting Psych: Negative for depression, anxiety, memory problems  Objective  Physical Exam Vitals:   04/26/17 0918  BP: 110/64  Pulse: 71  Temp: 98.5 F (36.9 C)    BP Readings from Last 3 Encounters:  04/26/17 110/64  05/25/16 116/78  04/23/16 129/73   Wt Readings from Last 3 Encounters:  04/26/17 280 lb 12.8 oz (127.4 kg)  05/25/16 280 lb (127 kg)  04/23/16 277 lb 6.4 oz (125.8 kg)    Physical Exam   Constitutional: No distress.  HENT:  Head: Normocephalic and atraumatic.  Mouth/Throat: Oropharynx is clear and moist. No oropharyngeal exudate.  Eyes: Conjunctivae are normal. Pupils are equal, round, and reactive to light.  Cardiovascular: Normal rate, regular rhythm and normal heart sounds.   Pulmonary/Chest: Effort normal and breath sounds normal.  Abdominal: Soft. Bowel sounds are normal. She exhibits no distension. There is no tenderness. There is no rebound and no guarding.  Musculoskeletal: She exhibits no edema.  Neurological: She is alert. Gait normal.  Skin: Skin is warm and dry. She is not diaphoretic.  Psychiatric: Mood and affect normal.     Assessment/Plan:   Routine general medical examination at a health care facility Physical exam completed. Pelvic exam and breast exam deferred to her gynecologist. Encouraged continued exercise. Encouraged her to continue to follow with the bariatric clinic. Did discuss possibility of increasing blood pressure and palpitations with phentermine. She'll continue to follow the bariatric clinic for this. Discussed risk of lisinopril if she were to get pregnant. She's been on this for many years. She notes she has no plans to get pregnant and has discussed this with her gynecologist as well. Currently on birth control. Lab work as outlined below.   Orders Placed This Encounter  Procedures  . Comp Met (CMET)  . Lipid Profile  . CBC  . TSH  . HgB A1c    Meds ordered this encounter  Medications  . phentermine 15 MG capsule    Sig: Take 15 mg by mouth every morning.  . Cyanocobalamin (B-12) 1000 MCG/ML KIT    Sig: Inject as directed.     [name], MD [place]

## 2017-05-12 DIAGNOSIS — N390 Urinary tract infection, site not specified: Secondary | ICD-10-CM | POA: Diagnosis not present

## 2017-05-12 DIAGNOSIS — N319 Neuromuscular dysfunction of bladder, unspecified: Secondary | ICD-10-CM | POA: Diagnosis not present

## 2017-05-15 ENCOUNTER — Other Ambulatory Visit: Payer: Self-pay | Admitting: Family Medicine

## 2017-06-13 ENCOUNTER — Other Ambulatory Visit: Payer: Self-pay | Admitting: Family Medicine

## 2017-08-03 ENCOUNTER — Other Ambulatory Visit: Payer: Self-pay

## 2017-08-03 MED ORDER — NORETHINDRONE 0.35 MG PO TABS: 1.0000 | ORAL_TABLET | Freq: Every day | ORAL | 0 refills | Status: DC

## 2017-08-30 ENCOUNTER — Encounter: Payer: Self-pay | Admitting: Obstetrics and Gynecology

## 2017-08-30 ENCOUNTER — Ambulatory Visit (INDEPENDENT_AMBULATORY_CARE_PROVIDER_SITE_OTHER): Payer: Federal, State, Local not specified - PPO | Admitting: Obstetrics and Gynecology

## 2017-08-30 VITALS — BP 130/80 | Ht 68.0 in | Wt 276.0 lb

## 2017-08-30 DIAGNOSIS — Z1151 Encounter for screening for human papillomavirus (HPV): Secondary | ICD-10-CM

## 2017-08-30 DIAGNOSIS — Z124 Encounter for screening for malignant neoplasm of cervix: Secondary | ICD-10-CM | POA: Diagnosis not present

## 2017-08-30 DIAGNOSIS — Z803 Family history of malignant neoplasm of breast: Secondary | ICD-10-CM | POA: Diagnosis not present

## 2017-08-30 DIAGNOSIS — I10 Essential (primary) hypertension: Secondary | ICD-10-CM

## 2017-08-30 DIAGNOSIS — Z3041 Encounter for surveillance of contraceptive pills: Secondary | ICD-10-CM | POA: Diagnosis not present

## 2017-08-30 DIAGNOSIS — F419 Anxiety disorder, unspecified: Secondary | ICD-10-CM

## 2017-08-30 DIAGNOSIS — Z01419 Encounter for gynecological examination (general) (routine) without abnormal findings: Secondary | ICD-10-CM

## 2017-08-30 DIAGNOSIS — Z1239 Encounter for other screening for malignant neoplasm of breast: Secondary | ICD-10-CM

## 2017-08-30 DIAGNOSIS — Z1231 Encounter for screening mammogram for malignant neoplasm of breast: Secondary | ICD-10-CM

## 2017-08-30 MED ORDER — LISINOPRIL 5 MG PO TABS: 5.0000 mg | ORAL_TABLET | Freq: Every day | ORAL | 3 refills | Status: DC

## 2017-08-30 MED ORDER — NORETHINDRONE 0.35 MG PO TABS: 1.0000 | ORAL_TABLET | Freq: Every day | ORAL | 3 refills | Status: DC

## 2017-08-30 MED ORDER — VENLAFAXINE HCL ER 150 MG PO CP24: 150.0000 mg | ORAL_CAPSULE | Freq: Every day | ORAL | 3 refills | Status: DC

## 2017-08-30 NOTE — Progress Notes (Signed)
PCP:  Leone Haven, MD   Chief Complaint  Patient presents with  . Gynecologic Exam     HPI:      [name] is a 41 y.o. G0P0000 who LMP was Patient's last menstrual period was 08/22/2017., presents today for her annual examination.  Her menses are regular every 28-30 days, lasting 7 days.  Dysmenorrhea none. She had 2 periods on OCPs this month which is unusual for her. No late/missed pills.   Sex activity: single partner, contraception - oral progesterone-only contraceptive.  Last Pap: December 08, 2013  Results were: no abnormalities /neg HPV DNA  Hx of STDs: none  There is a FH of breast cancer in her mat aunt, who is BRCA neg 2018. There is no FH of ovarian cancer. The patient does do self-breast exams.  Tobacco use: The patient denies current or previous tobacco use. Alcohol use: social drinker No drug use.  Exercise: moderately active  She does get adequate calcium and Vitamin D in her diet.  She takes lisinopril 5 mg daily for HTN with sx control. No side effects. 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 No side effects. Wants to continue Rx.  Labs with PCP.  Past Medical History:  Diagnosis Date  . Alopecia   . Alopecia   . Blood transfusion without reported diagnosis 1978  . H/O blood clots   . Hyperlipidemia   . Hypertension   . Neurogenic bladder   . Neurogenic bladder   . Pulmonary embolism ([place]) 08/2015  . Urinary tract infection   . Urine incontinence     Past Surgical History:  Procedure Laterality Date  . DILATION AND CURETTAGE OF UTERUS    . 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 Hyperlipidemia Mother   . Arthritis Father   . Hyperlipidemia Father   . Heart disease Father   . Hypertension Father   . Cancer Maternal Grandfather        Colon/Prostate Cancer  . Diabetes Paternal Grandfather   . Alcohol abuse Sister   . Drug abuse  Sister   . Mental illness Sister        BiPolar  . Cancer Maternal Aunt        Breast Cancer    Social History   Social History  . Marital status: Married    Spouse name: N/A  . Number of children: N/A  . Years of education: N/A   Occupational History  . Not on file.   Social History Main Topics  . Smoking status: Never Smoker  . Smokeless tobacco: Never Used  . Alcohol use 0.0 oz/week     Comment: 1 drink every other week   . Drug use: No  . Sexual activity: Yes    Partners: Male     Comment: Husband    Other Topics Concern  . 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    No children   5 dogs   Caffeine- Coffee 1 cup daily, tea- depends, soda every other day    Current Meds  Medication Sig  . Cyanocobalamin (B-12) 1000 MCG/ML KIT Inject as directed.  Marland Kitchen lisinopril (PRINIVIL,ZESTRIL) 5 MG tablet Take 1 tablet (5 mg total) by mouth daily.  Marland Kitchen  norethindrone (CAMILA) 0.35 MG tablet Take 1 tablet (0.35 mg total) by mouth daily. Reported on 10/21/2015  . phentermine 15 MG capsule Take 15 mg by mouth every morning.  . sulfamethoxazole-trimethoprim (BACTRIM,SEPTRA) 400-80 MG tablet Take by mouth.  . venlafaxine XR (EFFEXOR-XR) 150 MG 24 hr capsule Take 1 capsule (150 mg total) by mouth daily with breakfast.  . [DISCONTINUED] lisinopril (PRINIVIL,ZESTRIL) 5 MG tablet TAKE 1 TABLET BY MOUTH EVERY DAY  . [DISCONTINUED] norethindrone (CAMILA) 0.35 MG tablet Take 1 tablet (0.35 mg total) by mouth daily. Reported on 10/21/2015  . [DISCONTINUED] venlafaxine XR (EFFEXOR-XR) 150 MG 24 hr capsule TAKE 1 CAPSULE (150 MG TOTAL) BY MOUTH DAILY WITH BREAKFAST.     ROS:  Review of Systems  Constitutional: Negative for fatigue, fever and unexpected weight change.  Respiratory: Negative for cough, shortness of breath and wheezing.   Cardiovascular: Negative for chest pain, palpitations and leg  swelling.  Gastrointestinal: Negative for blood in stool, constipation, diarrhea, nausea and vomiting.  Endocrine: Negative for cold intolerance, heat intolerance and polyuria.  Genitourinary: Negative for dyspareunia, dysuria, flank pain, frequency, genital sores, hematuria, menstrual problem, pelvic pain, urgency, vaginal bleeding, vaginal discharge and vaginal pain.  Musculoskeletal: Negative for back pain, joint swelling and myalgias.  Skin: Negative for rash.  Neurological: Negative for dizziness, syncope, light-headedness, numbness and headaches.  Hematological: Negative for adenopathy.  Psychiatric/Behavioral: Negative for agitation, confusion, sleep disturbance and suicidal ideas. The patient is not nervous/anxious.      Objective: BP 130/80   Ht 5' 8"  (1.727 m)   Wt 276 lb (125.2 kg)   LMP 08/22/2017   BMI 41.97 kg/m    Physical Exam  Constitutional: She is oriented to person, place, and time. She appears well-developed and well-nourished.  Genitourinary: Vagina normal and uterus normal. There is no rash or tenderness on the right labia. There is no rash or tenderness on the left labia. No erythema or tenderness in the vagina. No vaginal discharge found. Right adnexum does not display mass and does not display tenderness. Left adnexum does not display mass and does not display tenderness. Cervix does not exhibit motion tenderness or polyp. Uterus is not enlarged or tender.  Neck: Normal range of motion. No thyromegaly present.  Cardiovascular: Normal rate, regular rhythm and normal heart sounds.   No murmur heard. Pulmonary/Chest: Effort normal and breath sounds normal. Right breast exhibits no mass, no nipple discharge, no skin change and no tenderness. Left breast exhibits no mass, no nipple discharge, no skin change and no tenderness.  Abdominal: Soft. There is no tenderness. There is no guarding.  Musculoskeletal: Normal range of motion.  Neurological: She is alert and  oriented to person, place, and time. No cranial nerve deficit.  Psychiatric: She has a normal mood and affect. Her behavior is normal.  Vitals reviewed.   Assessment/Plan: Encounter for annual routine gynecological examination  Cervical cancer screening - Plan: IGP, Aptima HPV  Screening for HPV (human papillomavirus) - Plan: IGP, Aptima HPV  Encounter for surveillance of contraceptive pills - OCP RF. F/u if BTB recurs for labs/u/s. - Plan: norethindrone (CAMILA) 0.35 MG tablet  Screening for breast cancer - Pt to sched mammo. - Plan: MM DIGITAL SCREENING BILATERAL  Family history of breast cancer - Pt's aunt gene neg this yr.   Essential hypertension - Rx RF lisinopril. Normal CMP 6/18. - Plan: lisinopril (PRINIVIL,ZESTRIL) 5 MG tablet  Anxiety - Rx RF effexor. - Plan: venlafaxine XR (EFFEXOR-XR) 150 MG 24 hr capsule  GYN counsel mammography screening, adequate intake of calcium and vitamin D, diet and exercise     F/U  Return in about 1 year (around 08/30/2018).  Alicia B. Copland, PA-C 08/30/2017 9:13 AM

## 2017-09-01 LAB — IGP, APTIMA HPV
HPV APTIMA: NEGATIVE
PAP Smear Comment: 0

## 2017-10-18 ENCOUNTER — Encounter: Payer: Self-pay | Admitting: Family Medicine

## 2017-10-21 ENCOUNTER — Encounter: Payer: Self-pay | Admitting: Family Medicine

## 2017-10-21 NOTE — Telephone Encounter (Signed)
I attempted to call the patient to get more details. Please contact patient on Friday and get more details regarding this.  Is she having any other symptoms with this?  I will send her a my chart message as well.  Thanks.

## 2017-10-22 DIAGNOSIS — M545 Low back pain: Secondary | ICD-10-CM | POA: Diagnosis not present

## 2017-10-22 NOTE — Telephone Encounter (Signed)
Can you call this patient and see what she means by numbness inside her rear end? If she is having numbness around her rectum she really should be examined today to consider imaging of her back. I would advise an urgent care evaluation for this.  Thanks.

## 2017-10-23 DIAGNOSIS — R32 Unspecified urinary incontinence: Secondary | ICD-10-CM | POA: Diagnosis not present

## 2017-10-23 DIAGNOSIS — Q068 Other specified congenital malformations of spinal cord: Secondary | ICD-10-CM | POA: Diagnosis not present

## 2017-10-23 DIAGNOSIS — M545 Low back pain: Secondary | ICD-10-CM | POA: Diagnosis not present

## 2017-10-23 DIAGNOSIS — Z6841 Body Mass Index (BMI) 40.0 and over, adult: Secondary | ICD-10-CM | POA: Diagnosis not present

## 2017-10-23 DIAGNOSIS — R200 Anesthesia of skin: Secondary | ICD-10-CM | POA: Diagnosis not present

## 2017-10-23 DIAGNOSIS — I10 Essential (primary) hypertension: Secondary | ICD-10-CM | POA: Diagnosis not present

## 2017-10-23 DIAGNOSIS — M5126 Other intervertebral disc displacement, lumbar region: Secondary | ICD-10-CM | POA: Diagnosis not present

## 2017-10-28 ENCOUNTER — Ambulatory Visit: Payer: Federal, State, Local not specified - PPO | Admitting: Family Medicine

## 2017-10-28 ENCOUNTER — Other Ambulatory Visit: Payer: Self-pay

## 2017-10-28 ENCOUNTER — Encounter: Payer: Self-pay | Admitting: Family Medicine

## 2017-10-28 VITALS — BP 130/80 | HR 89 | Temp 99.2°F | Wt 274.2 lb

## 2017-10-28 DIAGNOSIS — F329 Major depressive disorder, single episode, unspecified: Secondary | ICD-10-CM

## 2017-10-28 DIAGNOSIS — Z86711 Personal history of pulmonary embolism: Secondary | ICD-10-CM | POA: Diagnosis not present

## 2017-10-28 DIAGNOSIS — F419 Anxiety disorder, unspecified: Secondary | ICD-10-CM

## 2017-10-28 DIAGNOSIS — I10 Essential (primary) hypertension: Secondary | ICD-10-CM | POA: Diagnosis not present

## 2017-10-28 DIAGNOSIS — Q068 Other specified congenital malformations of spinal cord: Secondary | ICD-10-CM | POA: Diagnosis not present

## 2017-10-28 DIAGNOSIS — F32A Depression, unspecified: Secondary | ICD-10-CM

## 2017-10-28 LAB — BASIC METABOLIC PANEL
BUN: 10 mg/dL (ref 6–23)
CALCIUM: 8.6 mg/dL (ref 8.4–10.5)
CO2: 24 mEq/L (ref 19–32)
CREATININE: 0.85 mg/dL (ref 0.40–1.20)
Chloride: 107 mEq/L (ref 96–112)
GFR: 78.55 mL/min (ref >60.00)
Glucose, Bld: 94 mg/dL (ref 70–99)
POTASSIUM: 4.1 meq/L (ref 3.5–5.1)
Sodium: 136 mEq/L (ref 135–145)

## 2017-10-28 NOTE — Patient Instructions (Signed)
Nice to see you. We are going to try to get you into see neurosurgeon back. Please monitor the depression.  If this worsens please let us know. We will check lab work today and contact you with the results. If you develop thoughts of harming yourself, numbness, weakness, loss of bowel or bladder function, your legs please be evaluated again.

## 2017-10-28 NOTE — Assessment & Plan Note (Signed)
At goal.  Continue lisinopril.  Check lab work.

## 2017-10-28 NOTE — Assessment & Plan Note (Signed)
Patient with recent back pain and numbness.  She has been evaluated in the emergency room and had an MRI.  No significant compression noted.  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  Potentially contributing.  Will refer to neurosurgery for evaluation and management.  She will continue gabapentin 300 mg at night.  Given return precautions.

## 2017-10-28 NOTE — Assessment & Plan Note (Signed)
Potentially in the setting of urologic medication.  She has had no recurrence.  Not on anticoagulation.  Birth control is progesterone based.

## 2017-10-28 NOTE — Progress Notes (Signed)
[name], MD Phone: [phone]  Bridget Gould is a 41 y.o. female who presents today for follow-up.  Hypertension: Not checking at home.  Taking lisinopril.  No chest pain, shortness of breath, or edema.  Patient notes she started to have back pain recently in her low back.  She developed numbness in her buttocks and into her rectum.  No numbness down her legs.  No bowel incontinence.  No change in her urine incontinence.  No weakness in her legs.  She was advised to go to urgent care for evaluation last week and then subsequently went to the emergency room the next day after things did not improve after treatment from the urgent care.  MRI was done that showed a tethered cord with a small lipoma of the filum terminale.  She notes her symptoms are unchanged since being in the emergency room.  She still has some low back pain.  Still has numbness around her rectum.  No worsening of symptoms.  No change in bowel or bladder function.  She was on gabapentin 300 mg 3 times daily though this made her excessively drowsy and now she is taking it once at night.  Depression: Taking Effexor.  Notes she is a little bit depressed given all the stuff she has gone through with her back recently.  No anxiety or SI.  She has been seeing a bariatric clinic and is taking phentermine.  She has not had any blood pressure issues while seeing them.  Social History   Tobacco Use  Smoking Status Never Smoker  Smokeless Tobacco Never Used     ROS see history of present illness  Objective  Physical Exam Vitals:   10/28/17 0802  BP: 130/80  Pulse: 89  Temp: 99.2 F (37.3 C)  SpO2: 98%    BP Readings from Last 3 Encounters:  10/28/17 130/80  08/30/17 130/80  04/26/17 110/64   Wt Readings from Last 3 Encounters:  10/28/17 274 lb 3.2 oz (124.4 kg)  08/30/17 276 lb (125.2 kg)  04/26/17 280 lb 12.8 oz (127.4 kg)    Physical Exam  Constitutional: No distress.  Cardiovascular: Normal rate,  regular rhythm and normal heart sounds.  Pulmonary/Chest: Effort normal and breath sounds normal.  Musculoskeletal: She exhibits no edema.  No midline spine tenderness, no midline spine step-off, there is muscular lumbar tenderness with no skin changes, midline lumbar scar noted  Neurological: She is alert. Gait normal.  5/5 strength bilateral quads, hamstrings, plantar flexion, and dorsiflexion, sensation light touch intact bilateral lower extremities, slightly decreased sensation over bilateral buttocks  Skin: Skin is warm and dry. She is not diaphoretic.     Assessment/Plan: Please see individual problem list.  Benign essential HTN At goal.  Continue lisinopril.  Check lab work.  Tethered spinal cord [facility]) Patient with recent back pain and numbness.  She has been evaluated in the emergency room and had an MRI.  No significant compression noted.  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  Potentially contributing.  Will refer to neurosurgery for evaluation and management.  She will continue gabapentin 300 mg at night.  Given return precautions.  Anxiety and depression Some symptoms related to recent issues with her back.  No SI or HI.  She will continue Effexor.  Given return precautions.  History of pulmonary embolus (PE) Potentially in the setting of urologic medication.  She has had no recurrence.  Not on anticoagulation.  Birth control is progesterone based.  Morbid obesity ([place]) She is following with a bariatric clinic.  Currently on phentermine.  She will monitor her blood pressure on this.  Weight has trended down.   Bridget Gould was seen today for follow-up.  Diagnoses and all orders for this visit:  Tethered spinal cord [facility]) -     Ambulatory referral to Neurosurgery  Essential hypertension -     Basic Metabolic Panel (BMET)  Benign essential HTN  Anxiety and depression  History of pulmonary embolus ([place])  Morbid obesity ([place])    Orders Placed  This Encounter  Procedures  . Basic Metabolic Panel (BMET)  . Ambulatory referral to Neurosurgery    Referral Priority:   Routine    Referral Type:   Surgical    Referral Reason:   Specialty Services Required    Requested Specialty:   Neurosurgery    Number of Visits Requested:   1    No orders of the defined types were placed in this encounter.    [name], MD [place]

## 2017-10-28 NOTE — Assessment & Plan Note (Signed)
Some symptoms related to recent issues with her back.  No SI or HI.  She will continue Effexor.  Given return precautions.

## 2017-10-28 NOTE — Assessment & Plan Note (Signed)
She is following with a bariatric clinic.  Currently on phentermine.  She will monitor her blood pressure on this.  Weight has trended down.

## 2017-11-03 ENCOUNTER — Other Ambulatory Visit: Payer: Self-pay | Admitting: Unknown Physician Specialty

## 2017-11-03 DIAGNOSIS — Q068 Other specified congenital malformations of spinal cord: Secondary | ICD-10-CM | POA: Diagnosis not present

## 2017-11-05 ENCOUNTER — Ambulatory Visit
Admission: RE | Admit: 2017-11-05 | Discharge: 2017-11-05 | Disposition: A | Discharge status: Home / Self Care | Payer: Federal, State, Local not specified - PPO | Source: Ambulatory Visit | Attending: Unknown Physician Specialty | Admitting: Unknown Physician Specialty

## 2017-11-05 DIAGNOSIS — M5136 Other intervertebral disc degeneration, lumbar region: Secondary | ICD-10-CM | POA: Diagnosis not present

## 2017-11-05 DIAGNOSIS — Q068 Other specified congenital malformations of spinal cord: Secondary | ICD-10-CM

## 2017-11-05 DIAGNOSIS — D1779 Benign lipomatous neoplasm of other sites: Secondary | ICD-10-CM | POA: Insufficient documentation

## 2017-11-05 DIAGNOSIS — M48061 Spinal stenosis, lumbar region without neurogenic claudication: Secondary | ICD-10-CM | POA: Diagnosis not present

## 2017-11-11 ENCOUNTER — Telehealth: Payer: Self-pay | Admitting: Family Medicine

## 2017-11-11 NOTE — Telephone Encounter (Signed)
Copied from [place] [id number]. Topic: Quick Communication - See Telephone Encounter >> Nov 11, 2017  2:41 PM Hewitt Shorts wrote: CRM for notification. See Telephone encounter for: pt is needing to talk with [facility] regarding  Her referral to dr cook she hasnt heard any thing from them and would like to know what to do next   Best number [phone] 11/11/17.

## 2017-11-11 NOTE — Telephone Encounter (Signed)
Left message to return call, ok for [facility] to  speak to patient  

## 2017-11-11 NOTE — Telephone Encounter (Signed)
Please advise 

## 2017-11-11 NOTE — Telephone Encounter (Unsigned)
Copied from [place] [id number]. Topic: Quick Communication - See Telephone Encounter >> Nov 11, 2017  2:41 PM Hewitt Shorts wrote: CRM for notification. See Telephone encounter for: pt is needing to talk with [facility] regarding  Her referral to dr cook she hasnt heard any thing from them and would like to know what to do next   Best number [phone] 11/11/17.

## 2017-11-12 ENCOUNTER — Encounter: Payer: Self-pay | Admitting: Family Medicine

## 2017-11-12 NOTE — Telephone Encounter (Signed)
Patient sent you a mychart message

## 2017-11-12 NOTE — Telephone Encounter (Signed)
Responded to patient through mychart. Janett Billow, can you contact the [facility] office and let them know the patient has been trying to contact them regarding her referral to the surgeon and has not heard back. She has already seen the PA, though has gotten no response. Thanks.

## 2017-11-12 NOTE — Telephone Encounter (Signed)
Scheduled patient with Dr.Cook at [facility] clinic 11/23/17 0930. Left message to notify patient and will also send mychart message to patient

## 2017-11-23 DIAGNOSIS — Q068 Other specified congenital malformations of spinal cord: Secondary | ICD-10-CM | POA: Diagnosis not present

## 2017-12-15 DIAGNOSIS — R202 Paresthesia of skin: Secondary | ICD-10-CM | POA: Insufficient documentation

## 2017-12-15 DIAGNOSIS — Q068 Other specified congenital malformations of spinal cord: Secondary | ICD-10-CM | POA: Diagnosis not present

## 2017-12-15 DIAGNOSIS — R200 Anesthesia of skin: Secondary | ICD-10-CM | POA: Diagnosis not present

## 2018-01-06 DIAGNOSIS — M25552 Pain in left hip: Secondary | ICD-10-CM | POA: Diagnosis not present

## 2018-01-06 DIAGNOSIS — M25551 Pain in right hip: Secondary | ICD-10-CM | POA: Diagnosis not present

## 2018-01-06 DIAGNOSIS — M545 Low back pain: Secondary | ICD-10-CM | POA: Diagnosis not present

## 2018-01-11 DIAGNOSIS — M25552 Pain in left hip: Secondary | ICD-10-CM | POA: Diagnosis not present

## 2018-01-11 DIAGNOSIS — M545 Low back pain: Secondary | ICD-10-CM | POA: Diagnosis not present

## 2018-01-11 DIAGNOSIS — M25551 Pain in right hip: Secondary | ICD-10-CM | POA: Diagnosis not present

## 2018-01-12 DIAGNOSIS — Q068 Other specified congenital malformations of spinal cord: Secondary | ICD-10-CM | POA: Diagnosis not present

## 2018-01-12 DIAGNOSIS — R200 Anesthesia of skin: Secondary | ICD-10-CM | POA: Diagnosis not present

## 2018-01-13 DIAGNOSIS — M25552 Pain in left hip: Secondary | ICD-10-CM | POA: Diagnosis not present

## 2018-01-13 DIAGNOSIS — M25551 Pain in right hip: Secondary | ICD-10-CM | POA: Diagnosis not present

## 2018-01-13 DIAGNOSIS — M545 Low back pain: Secondary | ICD-10-CM | POA: Diagnosis not present

## 2018-01-18 DIAGNOSIS — M25551 Pain in right hip: Secondary | ICD-10-CM | POA: Diagnosis not present

## 2018-01-18 DIAGNOSIS — M545 Low back pain: Secondary | ICD-10-CM | POA: Diagnosis not present

## 2018-01-18 DIAGNOSIS — M25552 Pain in left hip: Secondary | ICD-10-CM | POA: Diagnosis not present

## 2018-01-25 DIAGNOSIS — M545 Low back pain: Secondary | ICD-10-CM | POA: Diagnosis not present

## 2018-01-25 DIAGNOSIS — M25552 Pain in left hip: Secondary | ICD-10-CM | POA: Diagnosis not present

## 2018-01-25 DIAGNOSIS — M25551 Pain in right hip: Secondary | ICD-10-CM | POA: Diagnosis not present

## 2018-02-01 DIAGNOSIS — M25551 Pain in right hip: Secondary | ICD-10-CM | POA: Diagnosis not present

## 2018-02-01 DIAGNOSIS — M25552 Pain in left hip: Secondary | ICD-10-CM | POA: Diagnosis not present

## 2018-02-01 DIAGNOSIS — M545 Low back pain: Secondary | ICD-10-CM | POA: Diagnosis not present

## 2018-02-08 DIAGNOSIS — M545 Low back pain: Secondary | ICD-10-CM | POA: Diagnosis not present

## 2018-02-08 DIAGNOSIS — M25552 Pain in left hip: Secondary | ICD-10-CM | POA: Diagnosis not present

## 2018-02-08 DIAGNOSIS — M25551 Pain in right hip: Secondary | ICD-10-CM | POA: Diagnosis not present

## 2018-02-17 DIAGNOSIS — M25551 Pain in right hip: Secondary | ICD-10-CM | POA: Diagnosis not present

## 2018-02-17 DIAGNOSIS — M545 Low back pain: Secondary | ICD-10-CM | POA: Diagnosis not present

## 2018-02-17 DIAGNOSIS — M25552 Pain in left hip: Secondary | ICD-10-CM | POA: Diagnosis not present

## 2018-03-01 DIAGNOSIS — M545 Low back pain: Secondary | ICD-10-CM | POA: Diagnosis not present

## 2018-03-01 DIAGNOSIS — M25552 Pain in left hip: Secondary | ICD-10-CM | POA: Diagnosis not present

## 2018-03-01 DIAGNOSIS — M25551 Pain in right hip: Secondary | ICD-10-CM | POA: Diagnosis not present

## 2018-03-08 DIAGNOSIS — M545 Low back pain: Secondary | ICD-10-CM | POA: Diagnosis not present

## 2018-03-08 DIAGNOSIS — M25551 Pain in right hip: Secondary | ICD-10-CM | POA: Diagnosis not present

## 2018-03-08 DIAGNOSIS — M25552 Pain in left hip: Secondary | ICD-10-CM | POA: Diagnosis not present

## 2018-03-15 DIAGNOSIS — M25552 Pain in left hip: Secondary | ICD-10-CM | POA: Diagnosis not present

## 2018-03-15 DIAGNOSIS — M545 Low back pain: Secondary | ICD-10-CM | POA: Diagnosis not present

## 2018-03-15 DIAGNOSIS — M25551 Pain in right hip: Secondary | ICD-10-CM | POA: Diagnosis not present

## 2018-03-22 DIAGNOSIS — M545 Low back pain: Secondary | ICD-10-CM | POA: Diagnosis not present

## 2018-03-22 DIAGNOSIS — M25551 Pain in right hip: Secondary | ICD-10-CM | POA: Diagnosis not present

## 2018-03-22 DIAGNOSIS — M25552 Pain in left hip: Secondary | ICD-10-CM | POA: Diagnosis not present

## 2018-03-29 DIAGNOSIS — M25552 Pain in left hip: Secondary | ICD-10-CM | POA: Diagnosis not present

## 2018-03-29 DIAGNOSIS — M545 Low back pain: Secondary | ICD-10-CM | POA: Diagnosis not present

## 2018-03-29 DIAGNOSIS — M25551 Pain in right hip: Secondary | ICD-10-CM | POA: Diagnosis not present

## 2018-04-05 DIAGNOSIS — M25552 Pain in left hip: Secondary | ICD-10-CM | POA: Diagnosis not present

## 2018-04-05 DIAGNOSIS — M545 Low back pain: Secondary | ICD-10-CM | POA: Diagnosis not present

## 2018-04-05 DIAGNOSIS — M25551 Pain in right hip: Secondary | ICD-10-CM | POA: Diagnosis not present

## 2018-04-07 DIAGNOSIS — K080 Exfoliation of teeth due to systemic causes: Secondary | ICD-10-CM | POA: Diagnosis not present

## 2018-04-12 DIAGNOSIS — M545 Low back pain: Secondary | ICD-10-CM | POA: Diagnosis not present

## 2018-04-12 DIAGNOSIS — M25552 Pain in left hip: Secondary | ICD-10-CM | POA: Diagnosis not present

## 2018-04-12 DIAGNOSIS — M25551 Pain in right hip: Secondary | ICD-10-CM | POA: Diagnosis not present

## 2018-04-25 ENCOUNTER — Encounter: Payer: Self-pay | Admitting: Family Medicine

## 2018-04-26 DIAGNOSIS — M545 Low back pain: Secondary | ICD-10-CM | POA: Diagnosis not present

## 2018-04-26 DIAGNOSIS — M25552 Pain in left hip: Secondary | ICD-10-CM | POA: Diagnosis not present

## 2018-04-26 DIAGNOSIS — M25551 Pain in right hip: Secondary | ICD-10-CM | POA: Diagnosis not present

## 2018-04-28 ENCOUNTER — Ambulatory Visit: Payer: Federal, State, Local not specified - PPO | Admitting: Family Medicine

## 2018-04-28 ENCOUNTER — Encounter: Payer: Self-pay | Admitting: Family Medicine

## 2018-04-28 VITALS — BP 116/84 | HR 96 | Temp 98.3°F | Ht 68.0 in | Wt 285.2 lb

## 2018-04-28 DIAGNOSIS — I10 Essential (primary) hypertension: Secondary | ICD-10-CM | POA: Diagnosis not present

## 2018-04-28 DIAGNOSIS — Q068 Other specified congenital malformations of spinal cord: Secondary | ICD-10-CM

## 2018-04-28 DIAGNOSIS — F419 Anxiety disorder, unspecified: Secondary | ICD-10-CM

## 2018-04-28 DIAGNOSIS — E785 Hyperlipidemia, unspecified: Secondary | ICD-10-CM

## 2018-04-28 DIAGNOSIS — F329 Major depressive disorder, single episode, unspecified: Secondary | ICD-10-CM

## 2018-04-28 DIAGNOSIS — F32A Depression, unspecified: Secondary | ICD-10-CM

## 2018-04-28 LAB — COMPREHENSIVE METABOLIC PANEL
ALK PHOS: 92 U/L (ref 39–117)
ALT: 17 U/L (ref 0–35)
AST: 20 U/L (ref 0–37)
Albumin: 4.4 g/dL (ref 3.5–5.2)
BILIRUBIN TOTAL: 0.3 mg/dL (ref 0.2–1.2)
BUN: 18 mg/dL (ref 6–23)
CALCIUM: 9.1 mg/dL (ref 8.4–10.5)
CO2: 23 mEq/L (ref 19–32)
Chloride: 107 mEq/L (ref 96–112)
Creatinine, Ser: 0.83 mg/dL (ref 0.40–1.20)
GFR: 80.54 mL/min (ref >60.00)
Glucose, Bld: 92 mg/dL (ref 70–99)
Potassium: 4.1 mEq/L (ref 3.5–5.1)
Sodium: 138 mEq/L (ref 135–145)
TOTAL PROTEIN: 7.3 g/dL (ref 6.0–8.3)

## 2018-04-28 LAB — LIPID PANEL
CHOL/HDL RATIO: 4
Cholesterol: 196 mg/dL (ref 0–200)
HDL: 48.5 mg/dL (ref >39.00)
LDL Cholesterol: 129 mg/dL — ABNORMAL HIGH (ref 0–99)
NonHDL: 147.4
TRIGLYCERIDES: 94 mg/dL (ref 0.0–149.0)
VLDL: 18.8 mg/dL (ref 0.0–40.0)

## 2018-04-28 LAB — HEMOGLOBIN A1C: Hgb A1c MFr Bld: 5.7 % (ref 4.6–6.5)

## 2018-04-28 NOTE — Assessment & Plan Note (Signed)
Check lipid panel  

## 2018-04-28 NOTE — Patient Instructions (Signed)
Nice to see you. I am glad your back is no longer hurting. We will check lab work today and contact you with the results.

## 2018-04-28 NOTE — Assessment & Plan Note (Signed)
Continue acupuncture.  If symptoms return she can see [place] or neurology.

## 2018-04-28 NOTE — Assessment & Plan Note (Signed)
At goal.  Continue current regimen.  Check lab work.

## 2018-04-28 NOTE — Progress Notes (Signed)
  [name], MD Phone: [phone]  Bridget Gould is a 42 y.o. female who presents today for f/u.  CC: htn, anxiety, depression, tethered spinal cord  HYPERTENSION  Disease Monitoring  Home BP Monitoring not checking Chest pain- no    Dyspnea- no Medications  Compliance-  lisinopril.  Edema- no  Anxiety/depression: She notes no symptoms.  The Effexor has helped her not be insane at work per her report.  No SI.  Tethered spinal cord: Patient saw a neurosurgeon and they advised against surgery as she has already had 2.  She ended up seeing a neurologist and I tried gabapentin though she did not like that.  She subsequently tried acupuncture and has been pain-free and has not had any numbness.  She has no symptoms currently.  She wonders about a letter for a job to make it noncompetitive.    Social History   Tobacco Use  Smoking Status Never Smoker  Smokeless Tobacco Never Used     ROS see history of present illness  Objective  Physical Exam Vitals:   04/28/18 0802  BP: 116/84  Pulse: 96  Temp: 98.3 F (36.8 C)  SpO2: 98%    BP Readings from Last 3 Encounters:  04/28/18 116/84  10/28/17 130/80  08/30/17 130/80   Wt Readings from Last 3 Encounters:  04/28/18 285 lb 3.2 oz (129.4 kg)  10/28/17 274 lb 3.2 oz (124.4 kg)  08/30/17 276 lb (125.2 kg)    Physical Exam  Constitutional: No distress.  Cardiovascular: Normal rate, regular rhythm and normal heart sounds.  Pulmonary/Chest: Effort normal and breath sounds normal.  Musculoskeletal: She exhibits no edema.  No midline spine tenderness, no midline spine step-off, no muscular back tenderness  Neurological: She is alert.  5/5 strength bilateral quads, hamstrings, plantar flexion, and dorsiflexion, sensation light touch intact bilateral lower extremities  Skin: Skin is warm and dry. She is not diaphoretic.     Assessment/Plan: Please see individual problem list.  Benign essential HTN At goal.   Continue current regimen.  Check lab work.  Tethered spinal cord (HCC) Continue acupuncture.  If symptoms return she can see [place] or neurology.  HLD (hyperlipidemia) Check lipid panel.  Anxiety and depression Asymptomatic.  Continue Effexor.   Orders Placed This Encounter  Procedures  . Lipid panel  . Comp Met (CMET)  . HgB A1c    No orders of the defined types were placed in this encounter.    [name], MD [place]

## 2018-04-28 NOTE — Assessment & Plan Note (Signed)
Asymptomatic.  Continue Effexor.

## 2018-05-10 DIAGNOSIS — M25552 Pain in left hip: Secondary | ICD-10-CM | POA: Diagnosis not present

## 2018-05-10 DIAGNOSIS — M25551 Pain in right hip: Secondary | ICD-10-CM | POA: Diagnosis not present

## 2018-05-10 DIAGNOSIS — M545 Low back pain: Secondary | ICD-10-CM | POA: Diagnosis not present

## 2018-05-31 DIAGNOSIS — M25552 Pain in left hip: Secondary | ICD-10-CM | POA: Diagnosis not present

## 2018-05-31 DIAGNOSIS — M545 Low back pain: Secondary | ICD-10-CM | POA: Diagnosis not present

## 2018-05-31 DIAGNOSIS — M25551 Pain in right hip: Secondary | ICD-10-CM | POA: Diagnosis not present

## 2018-06-02 HISTORY — PX: CHOLECYSTECTOMY: SHX55

## 2018-06-23 DIAGNOSIS — M545 Low back pain: Secondary | ICD-10-CM | POA: Diagnosis not present

## 2018-06-23 DIAGNOSIS — M25551 Pain in right hip: Secondary | ICD-10-CM | POA: Diagnosis not present

## 2018-06-23 DIAGNOSIS — M25552 Pain in left hip: Secondary | ICD-10-CM | POA: Diagnosis not present

## 2018-06-26 DIAGNOSIS — N319 Neuromuscular dysfunction of bladder, unspecified: Secondary | ICD-10-CM | POA: Diagnosis not present

## 2018-06-26 DIAGNOSIS — Z86711 Personal history of pulmonary embolism: Secondary | ICD-10-CM | POA: Diagnosis not present

## 2018-06-26 DIAGNOSIS — R1010 Upper abdominal pain, unspecified: Secondary | ICD-10-CM | POA: Diagnosis not present

## 2018-06-26 DIAGNOSIS — I10 Essential (primary) hypertension: Secondary | ICD-10-CM | POA: Diagnosis not present

## 2018-06-26 DIAGNOSIS — R109 Unspecified abdominal pain: Secondary | ICD-10-CM | POA: Diagnosis not present

## 2018-06-26 DIAGNOSIS — R1011 Right upper quadrant pain: Secondary | ICD-10-CM | POA: Diagnosis not present

## 2018-06-26 DIAGNOSIS — Z880 Allergy status to penicillin: Secondary | ICD-10-CM | POA: Diagnosis not present

## 2018-06-26 DIAGNOSIS — R110 Nausea: Secondary | ICD-10-CM | POA: Diagnosis not present

## 2018-06-26 DIAGNOSIS — Z79899 Other long term (current) drug therapy: Secondary | ICD-10-CM | POA: Diagnosis not present

## 2018-06-26 DIAGNOSIS — R61 Generalized hyperhidrosis: Secondary | ICD-10-CM | POA: Diagnosis not present

## 2018-06-26 DIAGNOSIS — R6883 Chills (without fever): Secondary | ICD-10-CM | POA: Diagnosis not present

## 2018-06-27 DIAGNOSIS — F411 Generalized anxiety disorder: Secondary | ICD-10-CM | POA: Diagnosis not present

## 2018-06-27 DIAGNOSIS — D72829 Elevated white blood cell count, unspecified: Secondary | ICD-10-CM | POA: Diagnosis not present

## 2018-06-27 DIAGNOSIS — N319 Neuromuscular dysfunction of bladder, unspecified: Secondary | ICD-10-CM | POA: Diagnosis not present

## 2018-06-27 DIAGNOSIS — R109 Unspecified abdominal pain: Secondary | ICD-10-CM | POA: Diagnosis not present

## 2018-06-27 DIAGNOSIS — F419 Anxiety disorder, unspecified: Secondary | ICD-10-CM | POA: Diagnosis not present

## 2018-06-27 DIAGNOSIS — I10 Essential (primary) hypertension: Secondary | ICD-10-CM | POA: Diagnosis not present

## 2018-06-27 DIAGNOSIS — Q068 Other specified congenital malformations of spinal cord: Secondary | ICD-10-CM | POA: Diagnosis not present

## 2018-06-27 DIAGNOSIS — Z9889 Other specified postprocedural states: Secondary | ICD-10-CM | POA: Diagnosis not present

## 2018-06-27 DIAGNOSIS — Z86711 Personal history of pulmonary embolism: Secondary | ICD-10-CM | POA: Diagnosis not present

## 2018-06-27 DIAGNOSIS — K8000 Calculus of gallbladder with acute cholecystitis without obstruction: Secondary | ICD-10-CM | POA: Diagnosis not present

## 2018-06-27 DIAGNOSIS — R112 Nausea with vomiting, unspecified: Secondary | ICD-10-CM | POA: Diagnosis not present

## 2018-06-27 DIAGNOSIS — Z880 Allergy status to penicillin: Secondary | ICD-10-CM | POA: Diagnosis not present

## 2018-06-27 DIAGNOSIS — Z79899 Other long term (current) drug therapy: Secondary | ICD-10-CM | POA: Diagnosis not present

## 2018-06-27 DIAGNOSIS — R6883 Chills (without fever): Secondary | ICD-10-CM | POA: Diagnosis not present

## 2018-06-27 DIAGNOSIS — Z79891 Long term (current) use of opiate analgesic: Secondary | ICD-10-CM | POA: Diagnosis not present

## 2018-06-27 DIAGNOSIS — K810 Acute cholecystitis: Secondary | ICD-10-CM | POA: Diagnosis not present

## 2018-06-27 DIAGNOSIS — K819 Cholecystitis, unspecified: Secondary | ICD-10-CM | POA: Diagnosis not present

## 2018-06-27 DIAGNOSIS — K8020 Calculus of gallbladder without cholecystitis without obstruction: Secondary | ICD-10-CM | POA: Diagnosis not present

## 2018-06-28 DIAGNOSIS — K8000 Calculus of gallbladder with acute cholecystitis without obstruction: Secondary | ICD-10-CM | POA: Diagnosis not present

## 2018-06-28 DIAGNOSIS — K8020 Calculus of gallbladder without cholecystitis without obstruction: Secondary | ICD-10-CM | POA: Diagnosis not present

## 2018-06-28 DIAGNOSIS — K810 Acute cholecystitis: Secondary | ICD-10-CM | POA: Diagnosis not present

## 2018-07-03 HISTORY — PX: APPENDECTOMY: SHX54

## 2018-07-07 ENCOUNTER — Encounter: Payer: Self-pay | Admitting: Family Medicine

## 2018-07-11 DIAGNOSIS — F411 Generalized anxiety disorder: Secondary | ICD-10-CM | POA: Diagnosis not present

## 2018-07-11 DIAGNOSIS — D72829 Elevated white blood cell count, unspecified: Secondary | ICD-10-CM | POA: Diagnosis not present

## 2018-07-11 DIAGNOSIS — K219 Gastro-esophageal reflux disease without esophagitis: Secondary | ICD-10-CM | POA: Diagnosis not present

## 2018-07-11 DIAGNOSIS — Z79899 Other long term (current) drug therapy: Secondary | ICD-10-CM | POA: Diagnosis not present

## 2018-07-11 DIAGNOSIS — N319 Neuromuscular dysfunction of bladder, unspecified: Secondary | ICD-10-CM | POA: Diagnosis not present

## 2018-07-11 DIAGNOSIS — E669 Obesity, unspecified: Secondary | ICD-10-CM | POA: Diagnosis not present

## 2018-07-11 DIAGNOSIS — R197 Diarrhea, unspecified: Secondary | ICD-10-CM | POA: Diagnosis not present

## 2018-07-11 DIAGNOSIS — R1010 Upper abdominal pain, unspecified: Secondary | ICD-10-CM | POA: Diagnosis not present

## 2018-07-11 DIAGNOSIS — R109 Unspecified abdominal pain: Secondary | ICD-10-CM | POA: Diagnosis not present

## 2018-07-11 DIAGNOSIS — Q068 Other specified congenital malformations of spinal cord: Secondary | ICD-10-CM | POA: Diagnosis not present

## 2018-07-11 DIAGNOSIS — K3580 Unspecified acute appendicitis: Secondary | ICD-10-CM | POA: Diagnosis not present

## 2018-07-11 DIAGNOSIS — R1084 Generalized abdominal pain: Secondary | ICD-10-CM | POA: Diagnosis not present

## 2018-07-11 DIAGNOSIS — Z880 Allergy status to penicillin: Secondary | ICD-10-CM | POA: Diagnosis not present

## 2018-07-11 DIAGNOSIS — I10 Essential (primary) hypertension: Secondary | ICD-10-CM | POA: Diagnosis not present

## 2018-07-11 DIAGNOSIS — R51 Headache: Secondary | ICD-10-CM | POA: Diagnosis not present

## 2018-07-11 DIAGNOSIS — Z86711 Personal history of pulmonary embolism: Secondary | ICD-10-CM | POA: Diagnosis not present

## 2018-07-11 DIAGNOSIS — K37 Unspecified appendicitis: Secondary | ICD-10-CM | POA: Diagnosis not present

## 2018-07-11 DIAGNOSIS — Z6841 Body Mass Index (BMI) 40.0 and over, adult: Secondary | ICD-10-CM | POA: Diagnosis not present

## 2018-07-11 DIAGNOSIS — K6389 Other specified diseases of intestine: Secondary | ICD-10-CM | POA: Diagnosis not present

## 2018-07-11 DIAGNOSIS — R1110 Vomiting, unspecified: Secondary | ICD-10-CM | POA: Diagnosis not present

## 2018-07-11 DIAGNOSIS — K3589 Other acute appendicitis without perforation or gangrene: Secondary | ICD-10-CM | POA: Diagnosis not present

## 2018-07-11 DIAGNOSIS — I2699 Other pulmonary embolism without acute cor pulmonale: Secondary | ICD-10-CM | POA: Diagnosis not present

## 2018-09-08 ENCOUNTER — Other Ambulatory Visit: Payer: Self-pay | Admitting: Obstetrics and Gynecology

## 2018-09-08 DIAGNOSIS — F419 Anxiety disorder, unspecified: Secondary | ICD-10-CM

## 2018-09-08 DIAGNOSIS — I10 Essential (primary) hypertension: Secondary | ICD-10-CM

## 2018-09-20 ENCOUNTER — Other Ambulatory Visit: Payer: Self-pay | Admitting: Obstetrics and Gynecology

## 2018-09-20 DIAGNOSIS — Z3041 Encounter for surveillance of contraceptive pills: Secondary | ICD-10-CM

## 2018-09-26 ENCOUNTER — Other Ambulatory Visit: Payer: Self-pay | Admitting: Obstetrics and Gynecology

## 2018-09-26 DIAGNOSIS — I10 Essential (primary) hypertension: Secondary | ICD-10-CM

## 2018-09-26 DIAGNOSIS — F419 Anxiety disorder, unspecified: Secondary | ICD-10-CM

## 2018-10-02 ENCOUNTER — Encounter: Payer: Self-pay | Admitting: Family Medicine

## 2018-10-04 ENCOUNTER — Other Ambulatory Visit: Payer: Self-pay

## 2018-10-04 DIAGNOSIS — I10 Essential (primary) hypertension: Secondary | ICD-10-CM

## 2018-10-04 DIAGNOSIS — F419 Anxiety disorder, unspecified: Secondary | ICD-10-CM

## 2018-10-04 DIAGNOSIS — Z3041 Encounter for surveillance of contraceptive pills: Secondary | ICD-10-CM

## 2018-10-04 MED ORDER — VENLAFAXINE HCL ER 150 MG PO CP24: 150.0000 mg | ORAL_CAPSULE | Freq: Every day | ORAL | 0 refills | Status: DC

## 2018-10-04 MED ORDER — LISINOPRIL 5 MG PO TABS: 5.0000 mg | ORAL_TABLET | Freq: Every day | ORAL | 2 refills | Status: DC

## 2018-10-04 MED ORDER — NORETHINDRONE 0.35 MG PO TABS: 1.0000 | ORAL_TABLET | Freq: Every day | ORAL | 0 refills | Status: DC

## 2018-10-14 DIAGNOSIS — N319 Neuromuscular dysfunction of bladder, unspecified: Secondary | ICD-10-CM | POA: Diagnosis not present

## 2018-10-14 DIAGNOSIS — R339 Retention of urine, unspecified: Secondary | ICD-10-CM | POA: Diagnosis not present

## 2018-10-27 ENCOUNTER — Encounter: Payer: Self-pay | Admitting: Obstetrics and Gynecology

## 2018-10-27 ENCOUNTER — Ambulatory Visit (INDEPENDENT_AMBULATORY_CARE_PROVIDER_SITE_OTHER): Payer: Federal, State, Local not specified - PPO | Admitting: Obstetrics and Gynecology

## 2018-10-27 VITALS — BP 144/90 | HR 77 | Ht 68.0 in | Wt 295.0 lb

## 2018-10-27 DIAGNOSIS — Z3041 Encounter for surveillance of contraceptive pills: Secondary | ICD-10-CM

## 2018-10-27 DIAGNOSIS — F419 Anxiety disorder, unspecified: Secondary | ICD-10-CM

## 2018-10-27 DIAGNOSIS — Z01419 Encounter for gynecological examination (general) (routine) without abnormal findings: Secondary | ICD-10-CM | POA: Diagnosis not present

## 2018-10-27 DIAGNOSIS — I10 Essential (primary) hypertension: Secondary | ICD-10-CM

## 2018-10-27 DIAGNOSIS — Z1239 Encounter for other screening for malignant neoplasm of breast: Secondary | ICD-10-CM

## 2018-10-27 DIAGNOSIS — Z803 Family history of malignant neoplasm of breast: Secondary | ICD-10-CM

## 2018-10-27 MED ORDER — NORETHINDRONE 0.35 MG PO TABS: 1.0000 | ORAL_TABLET | Freq: Every day | ORAL | 3 refills | Status: DC

## 2018-10-27 MED ORDER — VENLAFAXINE HCL ER 150 MG PO CP24: 150.0000 mg | ORAL_CAPSULE | Freq: Every day | ORAL | 3 refills | Status: DC

## 2018-10-27 MED ORDER — LISINOPRIL 5 MG PO TABS: 5.0000 mg | ORAL_TABLET | Freq: Every day | ORAL | 3 refills | Status: DC

## 2018-10-27 NOTE — Patient Instructions (Signed)
I value your feedback and entrusting us with your care. If you get a [facility] patient survey, I would appreciate you taking the time to let us know about your experience today. Thank you! 

## 2018-10-27 NOTE — Progress Notes (Signed)
PCP:  Leone Haven, MD   Chief Complaint  Patient presents with  . Gynecologic Exam     HPI:      Bridget Gould is a 42 y.o. G0P0000 who LMP was Patient's last menstrual period was 09/29/2018 (exact date)., presents today for her annual examination.  Her menses are regular every 28-30 days, lasting 7 days.  Dysmenorrhea mild, no BTB (resolved last yr).  Sex activity: single partner, contraception - oral progesterone-only contraceptive.  Last Pap: 08/30/17 Results were: no abnormalities /neg HPV DNA  Hx of STDs: none  Mammogram: not recent There is a FH of breast cancer in her mat aunt, who is BRCA/gene neg 2018. There is no FH of ovarian cancer. The patient does do self-breast exams.  Tobacco use: The patient denies current or previous tobacco use. Alcohol use: social drinker No drug use.  Exercise: moderately active  She does get adequate calcium but not Vitamin D in her diet.  She takes lisinopril 5 mg daily for HTN with sx control.BP elevated today but has been normal at home and other MD appts. Has PCP appt tomorrow. Normal labs/CMP 6/19. No side effects.  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 No side effects. Wants to continue Rx.  Labs with PCP.  Past Medical History:  Diagnosis Date  . Alopecia   . Alopecia   . Blood transfusion without reported diagnosis 1978  . H/O blood clots   . Hyperlipidemia   . Hypertension   . Neurogenic bladder   . Neurogenic bladder   . Pulmonary embolism ([place]) 08/2015  . Urinary tract infection   . Urine incontinence     Past Surgical History:  Procedure Laterality Date  . APPENDECTOMY  07/2018  . CHOLECYSTECTOMY  06/2018  . DILATION AND CURETTAGE OF UTERUS    . 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 Hyperlipidemia Mother   . Arthritis Father   . Hyperlipidemia Father   . Heart disease Father   . Hypertension  Father   . Cancer Maternal Grandfather        Colon/Prostate Cancer  . Diabetes Paternal Grandfather   . Alcohol abuse Sister   . Drug abuse Sister   . Mental illness Sister        [employer]  . Cancer Maternal Aunt        Breast Cancer    Social History   Socioeconomic History  . Marital status: Married    Spouse name: Not on file  . Number of children: Not on file  . Years of education: Not on file  . Highest education level: Not on file  Occupational History  . Not on file  Social Needs  . Financial resource strain: Not on file  . Food insecurity:    Worry: Not on file    Inability: Not on file  . Transportation needs:    Medical: Not on file    Non-medical: Not on file  Tobacco Use  . Smoking status: Never Smoker  . Smokeless tobacco: Never Used  Substance and Sexual Activity  . Alcohol use: Yes    Alcohol/week: 0.0 standard drinks    Comment: 1 drink every other week   . Drug use: No  . Sexual activity: Yes    Partners: Male    Birth control/protection: Pill    Comment: Husband  Lifestyle  . Physical activity:    Days per week: Not on file    Minutes per session: Not on file  . Stress: Not on file  Relationships  . Social connections:    Talks on phone: Not on file    Gets together: Not on file    Attends religious service: Not on file    Active member of club or organization: Not on file    Attends meetings of clubs or organizations: Not on file    Relationship status: Not on file  . Intimate partner violence:    Fear of current or ex partner: Not on file    Emotionally abused: Not on file    Physically abused: Not on file    Forced sexual activity: Not on file  Other Topics Concern  . 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    No children   5 dogs   Caffeine- Coffee 1 cup daily, tea- depends, soda every other day    Current Meds  Medication Sig  .  lisinopril (PRINIVIL,ZESTRIL) 5 MG tablet Take 1 tablet (5 mg total) by mouth daily.  . norethindrone (CAMILA) 0.35 MG tablet Take 1 tablet (0.35 mg total) by mouth daily. Reported on 10/21/2015  . sulfamethoxazole-trimethoprim (BACTRIM,SEPTRA) 400-80 MG tablet Take 0.5 tablets by mouth daily.  Marland Kitchen venlafaxine XR (EFFEXOR-XR) 150 MG 24 hr capsule Take 1 capsule (150 mg total) by mouth daily with breakfast.  . [DISCONTINUED] lisinopril (PRINIVIL,ZESTRIL) 5 MG tablet Take 1 tablet (5 mg total) by mouth daily.  . [DISCONTINUED] norethindrone (CAMILA) 0.35 MG tablet Take 1 tablet (0.35 mg total) by mouth daily. Reported on 10/21/2015  . [DISCONTINUED] venlafaxine XR (EFFEXOR-XR) 150 MG 24 hr capsule Take 1 capsule (150 mg total) by mouth daily with breakfast.     ROS:  Review of Systems  Constitutional: Negative for fatigue, fever and unexpected weight change.  Respiratory: Negative for cough, shortness of breath and wheezing.   Cardiovascular: Negative for chest pain, palpitations and leg swelling.  Gastrointestinal: Negative for blood in stool, constipation, diarrhea, nausea and vomiting.  Endocrine: Negative for cold intolerance, heat intolerance and polyuria.  Genitourinary: Positive for frequency. Negative for dyspareunia, dysuria, flank pain, genital sores, hematuria, menstrual problem, pelvic pain, urgency, vaginal bleeding, vaginal discharge and vaginal pain.  Musculoskeletal: Negative for back pain, joint swelling and myalgias.  Skin: Negative for rash.  Neurological: Negative for dizziness, syncope, light-headedness, numbness and headaches.  Hematological: Negative for adenopathy.  Psychiatric/Behavioral: Positive for agitation. Negative for confusion, sleep disturbance and suicidal ideas. The patient is not nervous/anxious.      Objective: BP (!) 144/90   Pulse 77   Ht _0  (1.727 m)   Wt 295 lb (133.8 kg)   LMP 09/29/2018 (Exact Date)   BMI 44.85 kg/m    Physical  Exam Constitutional:      Appearance: She is well-developed.  Genitourinary:     Vulva, vagina, uterus, right adnexa and left adnexa normal.     No vulval lesion or tenderness noted.     No vaginal discharge, erythema or tenderness.     No cervical motion tenderness or polyp.     Uterus is not enlarged or tender.     No right or left adnexal mass present.     Right adnexa not tender.     Left adnexa not  tender.  Neck:     Musculoskeletal: Normal range of motion.     Thyroid: No thyromegaly.  Cardiovascular:     Rate and Rhythm: Normal rate and regular rhythm.     Heart sounds: Normal heart sounds. No murmur.  Pulmonary:     Effort: Pulmonary effort is normal.     Breath sounds: Normal breath sounds.  Chest:     Breasts:        Right: No mass, nipple discharge, skin change or tenderness.        Left: No mass, nipple discharge, skin change or tenderness.  Abdominal:     Palpations: Abdomen is soft.     Tenderness: There is no abdominal tenderness. There is no guarding.  Musculoskeletal: Normal range of motion.  Neurological:     Mental Status: She is alert and oriented to person, place, and time.     Cranial Nerves: No cranial nerve deficit.  Psychiatric:        Behavior: Behavior normal.  Vitals signs reviewed.     Assessment/Plan: Encounter for annual routine gynecological examination  Screening for breast cancer - Pt to sched mammo - Plan: MM 3D SCREEN BREAST BILATERAL  Family history of breast cancer - Pt's affected aunt is gene neg 2018. - Plan: MM 3D SCREEN BREAST BILATERAL  Encounter for surveillance of contraceptive pills - OCP RF.  - Plan: norethindrone (CAMILA) 0.35 MG tablet  Essential hypertension - Rx RF lisinopril. Elevated reading today. Pt to check again with PCP appt tomorrow. F/u if cont to be >140/90 - Plan: lisinopril (PRINIVIL,ZESTRIL) 5 MG tablet  Anxiety - Rx RF effexor.  - Plan: venlafaxine XR (EFFEXOR-XR) 150 MG 24 hr capsule  Meds ordered  this encounter  Medications  . lisinopril (PRINIVIL,ZESTRIL) 5 MG tablet    Sig: Take 1 tablet (5 mg total) by mouth daily.    Dispense:  90 tablet    Refill:  3    Order Specific Question:   Supervising Provider    Answer:   Gae Dry [id number]  . norethindrone (CAMILA) 0.35 MG tablet    Sig: Take 1 tablet (0.35 mg total) by mouth daily. Reported on 10/21/2015    Dispense:  3 Package    Refill:  3    Order Specific Question:   Supervising Provider    Answer:   Gae Dry [id number]  . venlafaxine XR (EFFEXOR-XR) 150 MG 24 hr capsule    Sig: Take 1 capsule (150 mg total) by mouth daily with breakfast.    Dispense:  90 capsule    Refill:  3    Order Specific Question:   Supervising Provider    Answer:   Gae Dry [[id number]]              GYN counsel mammography screening, adequate intake of calcium and vitamin D, diet and exercise     F/U  Return in about 1 year (around 10/28/2019).  Swain Acree B. [place] Pacella, PA-C 10/27/2018 11:26 AM

## 2018-10-28 ENCOUNTER — Encounter: Payer: Self-pay | Admitting: Family Medicine

## 2018-10-28 ENCOUNTER — Ambulatory Visit: Payer: Federal, State, Local not specified - PPO | Admitting: Family Medicine

## 2018-10-28 VITALS — BP 120/84 | HR 73 | Temp 98.4°F | Ht 68.0 in | Wt 291.1 lb

## 2018-10-28 DIAGNOSIS — I10 Essential (primary) hypertension: Secondary | ICD-10-CM

## 2018-10-28 DIAGNOSIS — F419 Anxiety disorder, unspecified: Secondary | ICD-10-CM

## 2018-10-28 DIAGNOSIS — Z23 Encounter for immunization: Secondary | ICD-10-CM

## 2018-10-28 DIAGNOSIS — F329 Major depressive disorder, single episode, unspecified: Secondary | ICD-10-CM

## 2018-10-28 DIAGNOSIS — Z9049 Acquired absence of other specified parts of digestive tract: Secondary | ICD-10-CM

## 2018-10-28 DIAGNOSIS — F32A Depression, unspecified: Secondary | ICD-10-CM

## 2018-10-28 HISTORY — DX: Acquired absence of other specified parts of digestive tract: Z90.49

## 2018-10-28 LAB — BASIC METABOLIC PANEL
BUN: 13 mg/dL (ref 6–23)
CALCIUM: 9.3 mg/dL (ref 8.4–10.5)
CO2: 23 mEq/L (ref 19–32)
Chloride: 106 mEq/L (ref 96–112)
Creatinine, Ser: 0.96 mg/dL (ref 0.40–1.20)
GFR: 67.92 mL/min (ref >60.00)
GLUCOSE: 89 mg/dL (ref 70–99)
POTASSIUM: 4.8 meq/L (ref 3.5–5.1)
SODIUM: 137 meq/L (ref 135–145)

## 2018-10-28 NOTE — Assessment & Plan Note (Signed)
Encouraged continued exercise.  Discussed portion control.

## 2018-10-28 NOTE — Addendum Note (Signed)
Addended by: Myriam Forehand on: 10/28/2018 11:32 AM   Modules accepted: Orders

## 2018-10-28 NOTE — Assessment & Plan Note (Signed)
Doing well.  Patient has been released by her surgeon.

## 2018-10-28 NOTE — Assessment & Plan Note (Signed)
Adequately controlled.  We will have her return in 3 months for recheck.  Could consider discontinuing medication if she is able to lose weight and her blood pressure remains well controlled.  Check BMP.

## 2018-10-28 NOTE — Progress Notes (Signed)
  [name], MD Phone: [phone]  Bridget Gould is a 42 y.o. female who presents today for f/u.  CC: htn, anxiety/depression, obesity, history of appendectomy and cholecystectomy  HYPERTENSION  Disease Monitoring  Home BP Monitoring not checking Chest pain- no    Dyspnea- no Medications  Compliance-  Taking lisinopril.  Edema- no  Anxiety/depression: She notes her normal level of anxiety that is well controlled with Effexor.  No depression.  No SI.  Obesity: She has been doing exercise each day for the last 47 days.  It is 100-day program.  She feels like she is getting stronger.  Her clothes are fitting better.  She notes no significant weight loss yet.  She is trying to work on portion control.  They do eat fairly healthfully given that her husband recently had a heart attack.  Patient has done well after recent appendectomy and cholecystectomy.  She notes no abdominal pain.    Social History   Tobacco Use  Smoking Status Never Smoker  Smokeless Tobacco Never Used     ROS see history of present illness  Objective  Physical Exam Vitals:   10/28/18 0922  BP: 120/84  Pulse: 73  Temp: 98.4 F (36.9 C)  SpO2: 99%    BP Readings from Last 3 Encounters:  10/28/18 120/84  10/27/18 (!) 144/90  04/28/18 116/84   Wt Readings from Last 3 Encounters:  10/28/18 291 lb 1.9 oz (132.1 kg)  10/27/18 295 lb (133.8 kg)  04/28/18 285 lb 3.2 oz (129.4 kg)    Physical Exam Constitutional:      General: She is not in acute distress.    Appearance: She is not diaphoretic.  Cardiovascular:     Rate and Rhythm: Normal rate and regular rhythm.     Heart sounds: Normal heart sounds.  Pulmonary:     Effort: Pulmonary effort is normal.     Breath sounds: Normal breath sounds.  Musculoskeletal:     Right lower leg: No edema.     Left lower leg: No edema.  Skin:    General: Skin is warm and dry.  Neurological:     Mental Status: She is alert.       Assessment/Plan: Please see individual problem list.  Benign essential HTN Adequately controlled.  We will have her return in 3 months for recheck.  Could consider discontinuing medication if she is able to lose weight and her blood pressure remains well controlled.  Check BMP.  Anxiety and depression Well-controlled.  Continue Effexor.  Morbid obesity ([place]) Encouraged continued exercise.  Discussed portion control.  History of appendectomy Doing well.  Patient has been released by her surgeon.  History of cholecystectomy Doing well.  Patient has been released by her surgeon.    Orders Placed This Encounter  Procedures  . Basic Metabolic Panel (BMET)    No orders of the defined types were placed in this encounter.    [name], MD [place]

## 2018-10-28 NOTE — Patient Instructions (Signed)
Nice to see you. We will have you return in 3 months for repeat check of your blood pressure.  Please continue diet and exercise.

## 2018-10-28 NOTE — Assessment & Plan Note (Signed)
Well-controlled.  Continue Effexor.

## 2018-11-16 ENCOUNTER — Ambulatory Visit
Admission: RE | Admit: 2018-11-16 | Discharge: 2018-11-16 | Disposition: A | Discharge status: Home / Self Care | Payer: Federal, State, Local not specified - PPO | Source: Ambulatory Visit | Attending: Obstetrics and Gynecology | Admitting: Obstetrics and Gynecology

## 2018-11-16 DIAGNOSIS — Z803 Family history of malignant neoplasm of breast: Secondary | ICD-10-CM

## 2018-11-16 DIAGNOSIS — Z1239 Encounter for other screening for malignant neoplasm of breast: Secondary | ICD-10-CM | POA: Diagnosis not present

## 2018-11-16 DIAGNOSIS — Z1231 Encounter for screening mammogram for malignant neoplasm of breast: Secondary | ICD-10-CM | POA: Diagnosis not present

## 2018-11-23 ENCOUNTER — Encounter: Payer: Self-pay | Admitting: Obstetrics and Gynecology

## 2019-01-06 ENCOUNTER — Other Ambulatory Visit: Payer: Self-pay | Admitting: Podiatry

## 2019-01-06 ENCOUNTER — Ambulatory Visit (INDEPENDENT_AMBULATORY_CARE_PROVIDER_SITE_OTHER): Payer: Federal, State, Local not specified - PPO

## 2019-01-06 ENCOUNTER — Encounter: Payer: Self-pay | Admitting: Podiatry

## 2019-01-06 ENCOUNTER — Ambulatory Visit: Payer: Federal, State, Local not specified - PPO | Admitting: Podiatry

## 2019-01-06 VITALS — BP 119/79 | HR 93

## 2019-01-06 DIAGNOSIS — M7661 Achilles tendinitis, right leg: Secondary | ICD-10-CM

## 2019-01-06 DIAGNOSIS — M7672 Peroneal tendinitis, left leg: Secondary | ICD-10-CM

## 2019-01-06 DIAGNOSIS — L989 Disorder of the skin and subcutaneous tissue, unspecified: Secondary | ICD-10-CM

## 2019-01-06 DIAGNOSIS — M722 Plantar fascial fibromatosis: Secondary | ICD-10-CM

## 2019-01-06 MED ORDER — METHYLPREDNISOLONE 4 MG PO TBPK: ORAL_TABLET | ORAL | 0 refills | Status: DC

## 2019-01-06 MED ORDER — MELOXICAM 15 MG PO TABS: 15.0000 mg | ORAL_TABLET | Freq: Every day | ORAL | 1 refills | Status: DC

## 2019-01-06 MED ORDER — NONFORMULARY OR COMPOUNDED ITEM: 2 refills | Status: DC

## 2019-01-09 NOTE — Progress Notes (Signed)
   HPI: 43 year old female presenting today with a chief complaint of lateral foot pain of the bilateral feet that began 3-4 months ago. She also reports associated pain in her posterior right heel. Walking and standing for long periods of time increases the pain. She denies any increase or change in activity. She has not done anything for treatment. Patient is here for further evaluation and treatment.   Past Medical History:  Diagnosis Date  . Alopecia   . Alopecia   . Blood transfusion without reported diagnosis 1978  . H/O blood clots   . Hyperlipidemia   . Hypertension   . Neurogenic bladder   . Neurogenic bladder   . Pulmonary embolism ([place]) 08/2015  . Urinary tract infection   . Urine incontinence       Physical Exam: General: The patient is alert and oriented x3 in no acute distress.  Dermatology: Hyperkeratotic lesion present on the right sub-fifth MPJ. Pain on palpation with a central nucleated core noted. Skin is warm, dry and supple bilateral lower extremities. Negative for open lesions or macerations.  Vascular: Palpable pedal pulses bilaterally. No edema or erythema noted. Capillary refill within normal limits.  Neurological: Epicritic and protective threshold grossly intact bilaterally.   Musculoskeletal Exam: Pain on palpation noted to the posterior tubercle of the right calcaneus at the insertion of the Achilles tendon consistent with retrocalcaneal bursitis. Pain with palpation to the peroneal tendon of the left foot. Range of motion within normal limits. Muscle strength 5/5 in all muscle groups bilateral lower extremities.  Radiographic Exam:  Posterior calcaneal spur noted to the respective calcaneus on lateral view. No fracture or dislocation noted. Normal osseous mineralization noted.     Assessment: 1. Achilles tendinitis right 2. Porokeratosis right sub-fifth MPJ 3. Peroneal tendinitis left    Plan of Care:  1. Patient was evaluated. Radiographs were  reviewed today. 2. Prescription for Medrol Dose Pak provided to patient. 3. Prescription for Meloxicam provided to patient. 4. Prescription for Achilles tendinitis cream to be dispensed by [facility].  5. Excisional debridement of keratotic lesion using a chisel blade was performed without incident. Salinocaine applied and light dressing placed. 6. Recommended OTC corn and callus remover.  7. Return to clinic in 4 weeks.    Edrick Kins, DPM [facility]  Dr. Edrick Kins, [address]                                        [place], [place] [address]                Office [phone]  Fax [phone]

## 2019-01-31 ENCOUNTER — Telehealth: Payer: Self-pay | Admitting: Family Medicine

## 2019-01-31 NOTE — Telephone Encounter (Signed)
Copied from [place] [id number]. Topic: Quick Communication - See Telephone Encounter >> Jan 31, 2019 10:49 AM Loma [place] wrote: CRM for notification. See Telephone encounter for: 01/31/19. PT has cancelled this FU for hypertension because her readings are fine.and the COVID ongoing.  She will keep a close watch on her numbers and will most definitely call back in and sch a virtual visit if any readings are out of line. She is doing great!!!

## 2019-01-31 NOTE — Telephone Encounter (Signed)
Sent to PCP as an FYI  

## 2019-01-31 NOTE — Telephone Encounter (Signed)
Called pt and left a VM to call back. CRM created and sent to [facility] pool. 

## 2019-01-31 NOTE — Telephone Encounter (Signed)
Noted.  Can you please find out what her readings are recently?  Please also try to get her set up for follow-up in about 4 months.

## 2019-02-01 NOTE — Telephone Encounter (Signed)
Called pt and left a VM to call back on cell and home number. CRM created and sent to [facility] pool.

## 2019-02-01 NOTE — Telephone Encounter (Signed)
Sent patient a mychart message as well.

## 2019-02-01 NOTE — Telephone Encounter (Signed)
Called and spoke with pt. Pt stated that she just brought a blood pressure monitor off [employer] and once she gets this she will start keeping track of her BP for 1-2 weeks checking her BP twice daily and will send [place] a message on mychart.   Pt has been scheduled for her 4 month follow up appt as well in July.   Sent to PCP as an [place]

## 2019-02-03 ENCOUNTER — Ambulatory Visit: Payer: Federal, State, Local not specified - PPO | Admitting: Family Medicine

## 2019-02-03 ENCOUNTER — Ambulatory Visit: Payer: Federal, State, Local not specified - PPO | Admitting: Podiatry

## 2019-02-20 ENCOUNTER — Encounter: Payer: Self-pay | Admitting: Family Medicine

## 2019-02-20 DIAGNOSIS — I10 Essential (primary) hypertension: Secondary | ICD-10-CM

## 2019-02-20 NOTE — Telephone Encounter (Signed)
Sent to PCP ?

## 2019-03-02 MED ORDER — LISINOPRIL 10 MG PO TABS: 5.0000 mg | ORAL_TABLET | Freq: Every day | ORAL | 2 refills | Status: DC

## 2019-03-02 NOTE — Telephone Encounter (Signed)
Please let patient know I have sent in the increased dose of lisinopril from 5mg  to now 10 mg daily.  Please also schedule for lab visit in 7 days for check on her electrolytes after lisinopril increase.  Thanks!  Philis Nettle FNP

## 2019-03-05 MED ORDER — LISINOPRIL 10 MG PO TABS: 10.0000 mg | ORAL_TABLET | Freq: Every day | ORAL | 2 refills | Status: DC

## 2019-03-13 ENCOUNTER — Other Ambulatory Visit: Payer: Self-pay

## 2019-03-13 ENCOUNTER — Other Ambulatory Visit (INDEPENDENT_AMBULATORY_CARE_PROVIDER_SITE_OTHER): Payer: Federal, State, Local not specified - PPO

## 2019-03-13 DIAGNOSIS — I10 Essential (primary) hypertension: Secondary | ICD-10-CM

## 2019-03-13 LAB — BASIC METABOLIC PANEL
BUN: 10 mg/dL (ref 6–23)
CO2: 26 mEq/L (ref 19–32)
Calcium: 8.6 mg/dL (ref 8.4–10.5)
Chloride: 103 mEq/L (ref 96–112)
Creatinine, Ser: 0.88 mg/dL (ref 0.40–1.20)
GFR: 70.53 mL/min (ref >60.00)
Glucose, Bld: 93 mg/dL (ref 70–99)
Potassium: 3.9 mEq/L (ref 3.5–5.1)
Sodium: 136 mEq/L (ref 135–145)

## 2019-03-13 NOTE — Addendum Note (Signed)
Addended by: Leeanne Rio on: 03/13/2019 09:56 AM   Modules accepted: Orders

## 2019-03-17 ENCOUNTER — Telehealth: Payer: Self-pay | Admitting: Family Medicine

## 2019-03-17 ENCOUNTER — Encounter: Payer: Self-pay | Admitting: Family Medicine

## 2019-03-17 ENCOUNTER — Ambulatory Visit (INDEPENDENT_AMBULATORY_CARE_PROVIDER_SITE_OTHER): Payer: Federal, State, Local not specified - PPO | Admitting: Family Medicine

## 2019-03-17 ENCOUNTER — Other Ambulatory Visit: Payer: Self-pay

## 2019-03-17 DIAGNOSIS — F419 Anxiety disorder, unspecified: Secondary | ICD-10-CM | POA: Diagnosis not present

## 2019-03-17 DIAGNOSIS — I10 Essential (primary) hypertension: Secondary | ICD-10-CM | POA: Diagnosis not present

## 2019-03-17 DIAGNOSIS — F329 Major depressive disorder, single episode, unspecified: Secondary | ICD-10-CM

## 2019-03-17 DIAGNOSIS — F32A Depression, unspecified: Secondary | ICD-10-CM

## 2019-03-17 NOTE — Telephone Encounter (Signed)
Please contact the patient and get her set up for lab work in 2 months.  Please get her set up for follow-up in the office in 4 months.  Thanks.

## 2019-03-17 NOTE — Assessment & Plan Note (Signed)
She will check daily for 2 weeks and contact us with her blood pressures.  She will continue lisinopril.  Lab work in 2 months.

## 2019-03-17 NOTE — Assessment & Plan Note (Signed)
She will continue to work on exercise and monitoring her diet.  I did discuss that she may notice that inches come off prior to weight coming off and if she sticks with that she should start to lose some weight.

## 2019-03-17 NOTE — Telephone Encounter (Signed)
Appointments have been made

## 2019-03-17 NOTE — Progress Notes (Signed)
Virtual Visit via video Note  This visit type was conducted due to national recommendations for restrictions regarding the COVID-19 pandemic (e.g. social distancing).  This format is felt to be most appropriate for this patient at this time.  All issues noted in this document were discussed and addressed.  No physical exam was performed (except for noted visual exam findings with Video Visits).   I connected with Bridget Gould today at 11:30 AM EDT by a video enabled telemedicine application and verified that I am speaking with the correct person using two identifiers. Location patient: home Location provider: work Persons participating in the virtual visit: patient, provider  I discussed the limitations, risks, security and privacy concerns of performing an evaluation and management service by telephone and the availability of in person appointments. I also discussed with the patient that there may be a patient responsible charge related to this service. The patient expressed understanding and agreed to proceed.   Reason for visit: f/u.   HPI: HYPERTENSION  Disease Monitoring  Home BP Monitoring not checking recently Chest pain- no    Dyspnea- no Medications  Compliance-  Taking lisinopril.  Edema- no  Anxiety/depression: Patient notes no anxiety or depression.  No SI.  The Effexor has been beneficial.  Obesity: Patient has been exercising daily.  She is doing the beach body exercise program.  She is also walking 5 km each weekend.  She is doing a low-sodium diet and monitoring other dietary intake.  She has not lost any weight though she is losing inches.     ROS: See pertinent positives and negatives per HPI.  Past Medical History:  Diagnosis Date  . Alopecia   . Alopecia   . Blood transfusion without reported diagnosis 1978  . H/O blood clots   . Hyperlipidemia   . Hypertension   . Neurogenic bladder   . Neurogenic bladder   . Pulmonary embolism ([place]) 08/2015  .  Urinary tract infection   . Urine incontinence     Past Surgical History:  Procedure Laterality Date  . APPENDECTOMY  07/2018  . CHOLECYSTECTOMY  06/2018  . DILATION AND CURETTAGE OF UTERUS    . 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 Hyperlipidemia Mother   . Arthritis Father   . Hyperlipidemia Father   . Heart disease Father   . Hypertension Father   . Cancer Maternal Grandfather        Colon/Prostate Cancer  . Diabetes Paternal Grandfather   . Alcohol abuse Sister   . Drug abuse Sister   . Mental illness Sister        BiPolar  . Cancer Maternal Aunt        Breast Cancer  . Breast cancer Maternal Aunt 20    SOCIAL HX: nonsmoker   Current Outpatient Medications:  .  lisinopril (ZESTRIL) 10 MG tablet, Take 1 tablet (10 mg total) by mouth daily., Disp: 90 tablet, Rfl: 2 .  norethindrone (CAMILA) 0.35 MG tablet, Take 1 tablet (0.35 mg total) by mouth daily. Reported on 10/21/2015, Disp: 3 Package, Rfl: 3 .  sulfamethoxazole-trimethoprim (BACTRIM,SEPTRA) 400-80 MG tablet, Take 0.5 tablets by mouth daily., Disp: , Rfl: 1 .  venlafaxine XR (EFFEXOR-XR) 150 MG 24 hr capsule, Take 1 capsule (150 mg total) by mouth daily with breakfast., Disp: 90 capsule, Rfl: 3 .  meloxicam (MOBIC) 15 MG tablet, Take  1 tablet (15 mg total) by mouth daily. (Patient not taking: Reported on 03/17/2019), Disp: 30 tablet, Rfl: 1 .  methylPREDNISolone (MEDROL DOSEPAK) 4 MG TBPK tablet, 6 day dose pack - take as directed (Patient not taking: Reported on 03/17/2019), Disp: 21 tablet, Rfl: 0 .  NONFORMULARY OR COMPOUNDED ITEM, See pharmacy note (Patient not taking: Reported on 03/17/2019), Disp: 120 each, Rfl: 2  EXAM:  VITALS per patient if applicable:  GENERAL: alert, oriented, appears well and in no acute distress  HEENT: atraumatic, conjunttiva clear, no obvious abnormalities on inspection of external nose and ears  NECK:  normal movements of the head and neck  LUNGS: on inspection no signs of respiratory distress, breathing rate appears normal, no obvious gross SOB, gasping or wheezing  CV: no obvious cyanosis  MS: moves all visible extremities without noticeable abnormality  PSYCH/NEURO: pleasant and cooperative, no obvious depression or anxiety, speech and thought processing grossly intact  ASSESSMENT AND PLAN:  Discussed the following assessment and plan:  Benign essential HTN - Plan: Lipid panel, Comp Met (CMET)  Anxiety and depression  Morbid obesity ([place]) - Plan: Hemoglobin A1c  Benign essential HTN She will check daily for 2 weeks and contact us with her blood pressures.  She will continue lisinopril.  Lab work in 2 months.  Anxiety and depression Asymptomatic.  Continue current medication.  Morbid obesity ([place]) She will continue to work on exercise and monitoring her diet.  I did discuss that she may notice that inches come off prior to weight coming off and if she sticks with that she should start to lose some weight.  CMA will contact the patient to get her set up for follow-up in 4 months and lab work in 2 months.  Social distancing precautions and sick precautions given regarding COVID-19.   I discussed the assessment and treatment plan with the patient. The patient was provided an opportunity to ask questions and all were answered. The patient agreed with the plan and demonstrated an understanding of the instructions.   The patient was advised to call back or seek an in-person evaluation if the symptoms worsen or if the condition fails to improve as anticipated.    [name], MD

## 2019-03-17 NOTE — Assessment & Plan Note (Signed)
Asymptomatic.  Continue current medication. 

## 2019-05-17 ENCOUNTER — Other Ambulatory Visit (INDEPENDENT_AMBULATORY_CARE_PROVIDER_SITE_OTHER): Payer: Federal, State, Local not specified - PPO

## 2019-05-17 ENCOUNTER — Ambulatory Visit: Payer: Federal, State, Local not specified - PPO | Admitting: Family Medicine

## 2019-05-17 ENCOUNTER — Other Ambulatory Visit: Payer: Self-pay

## 2019-05-17 DIAGNOSIS — I10 Essential (primary) hypertension: Secondary | ICD-10-CM | POA: Diagnosis not present

## 2019-05-17 LAB — LIPID PANEL
Cholesterol: 187 mg/dL (ref 0–200)
HDL: 46.9 mg/dL (ref >39.00)
LDL Cholesterol: 126 mg/dL — ABNORMAL HIGH (ref 0–99)
NonHDL: 140.25
Total CHOL/HDL Ratio: 4
Triglycerides: 69 mg/dL (ref 0.0–149.0)
VLDL: 13.8 mg/dL (ref 0.0–40.0)

## 2019-05-17 LAB — COMPREHENSIVE METABOLIC PANEL
ALT: 16 U/L (ref 0–35)
AST: 15 U/L (ref 0–37)
Albumin: 4.4 g/dL (ref 3.5–5.2)
Alkaline Phosphatase: 113 U/L (ref 39–117)
BUN: 14 mg/dL (ref 6–23)
CO2: 24 mEq/L (ref 19–32)
Calcium: 8.6 mg/dL (ref 8.4–10.5)
Chloride: 106 mEq/L (ref 96–112)
Creatinine, Ser: 0.9 mg/dL (ref 0.40–1.20)
GFR: 68.66 mL/min (ref >60.00)
Glucose, Bld: 92 mg/dL (ref 70–99)
Potassium: 4.4 mEq/L (ref 3.5–5.1)
Sodium: 137 mEq/L (ref 135–145)
Total Bilirubin: 0.3 mg/dL (ref 0.2–1.2)
Total Protein: 6.7 g/dL (ref 6.0–8.3)

## 2019-05-17 LAB — HEMOGLOBIN A1C: Hgb A1c MFr Bld: 5.6 % (ref 4.6–6.5)

## 2019-06-26 ENCOUNTER — Encounter: Payer: Self-pay | Admitting: Family Medicine

## 2019-07-18 ENCOUNTER — Other Ambulatory Visit: Payer: Self-pay

## 2019-07-18 ENCOUNTER — Ambulatory Visit (INDEPENDENT_AMBULATORY_CARE_PROVIDER_SITE_OTHER): Payer: Federal, State, Local not specified - PPO | Admitting: *Deleted

## 2019-07-18 DIAGNOSIS — I10 Essential (primary) hypertension: Secondary | ICD-10-CM

## 2019-07-18 DIAGNOSIS — Z23 Encounter for immunization: Secondary | ICD-10-CM | POA: Diagnosis not present

## 2019-07-18 NOTE — Progress Notes (Signed)
Patient here for nurse visit BP check per order from 06/26/19 my chart message.   Patient reports compliance with prescribed BP medications: yes  Last dose of BP medication: 2 weeks ago as ordered by PCP  BP Readings from Last 3 Encounters:  07/18/19 118/78  01/06/19 119/79  10/28/18 120/84   Pulse Readings from Last 3 Encounters:  07/18/19 79  01/06/19 93  10/28/18 73      Patient verbalized understanding of instructions.   Kerin Salen, RN

## 2019-07-28 ENCOUNTER — Other Ambulatory Visit: Payer: Self-pay

## 2019-07-28 ENCOUNTER — Encounter: Payer: Self-pay | Admitting: Family Medicine

## 2019-07-28 ENCOUNTER — Ambulatory Visit (INDEPENDENT_AMBULATORY_CARE_PROVIDER_SITE_OTHER): Payer: Federal, State, Local not specified - PPO | Admitting: Family Medicine

## 2019-07-28 DIAGNOSIS — I10 Essential (primary) hypertension: Secondary | ICD-10-CM

## 2019-07-28 DIAGNOSIS — Q068 Other specified congenital malformations of spinal cord: Secondary | ICD-10-CM | POA: Diagnosis not present

## 2019-07-28 DIAGNOSIS — N319 Neuromuscular dysfunction of bladder, unspecified: Secondary | ICD-10-CM | POA: Diagnosis not present

## 2019-07-28 NOTE — Assessment & Plan Note (Signed)
Blood pressure is well controlled off of medication.  She will continue to periodically monitor it and let us know if it trends up.

## 2019-07-28 NOTE — Assessment & Plan Note (Signed)
No issues with recurrent UTI on her current regimen of Bactrim.  She will monitor for any UTI symptoms.

## 2019-07-28 NOTE — Assessment & Plan Note (Signed)
Doing well with acupuncture.  She will monitor for any new symptoms.

## 2019-07-28 NOTE — Progress Notes (Signed)
Virtual Visit via video Note  This visit type was conducted due to national recommendations for restrictions regarding the COVID-19 pandemic (e.g. social distancing).  This format is felt to be most appropriate for this patient at this time.  All issues noted in this document were discussed and addressed.  No physical exam was performed (except for noted visual exam findings with Video Visits).   I connected with Bridget Gould  today at  4:00 PM EDT by a video enabled telemedicine application and verified that I am speaking with the correct person using two identifiers. Location patient: home Location provider: work Persons participating in the virtual visit: patient, provider  I discussed the limitations, risks, security and privacy concerns of performing an evaluation and management service by telephone and the availability of in person appointments. I also discussed with the patient that there may be a patient responsible charge related to this service. The patient expressed understanding and agreed to proceed.   Reason for visit: follow-up  HPI: Hypertension: Blood pressure has been very well controlled off of medication.  Typically ranging 107-130/67-87.  She had one blood pressure at 141/90.  No chest pain, shortness of breath, or edema.  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  She has been doing acupuncture which is helpful.  No back pain.  She has stable urinary incontinence.  No stool incontinence.  Neurogenic bladder/recurrent UTIs: Patient has been on Bactrim through urology for quite some time now.  She has not had any UTI issues in several years.  No recent UTI symptoms.   ROS: See pertinent positives and negatives per HPI.  Past Medical History:  Diagnosis Date  . Alopecia   . Alopecia   . Blood transfusion without reported diagnosis 1978  . H/O blood clots   . Hyperlipidemia   . Hypertension   . Neurogenic bladder   . Neurogenic bladder    . Pulmonary embolism ([place]) 08/2015  . Urinary tract infection   . Urine incontinence     Past Surgical History:  Procedure Laterality Date  . APPENDECTOMY  07/2018  . CHOLECYSTECTOMY  06/2018  . DILATION AND CURETTAGE OF UTERUS    . 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 Hyperlipidemia Mother   . Arthritis Father   . Hyperlipidemia Father   . Heart disease Father   . Hypertension Father   . Cancer Maternal Grandfather        Colon/Prostate Cancer  . Diabetes Paternal Grandfather   . Alcohol abuse Sister   . Drug abuse Sister   . Mental illness Sister        BiPolar  . Cancer Maternal Aunt        Breast Cancer  . Breast cancer Maternal Aunt 22    SOCIAL HX: Non-smoker.   Current Outpatient Medications:  .  norethindrone (CAMILA) 0.35 MG tablet, Take 1 tablet (0.35 mg total) by mouth daily. Reported on 10/21/2015, Disp: 3 Package, Rfl: 3 .  sulfamethoxazole-trimethoprim (BACTRIM,SEPTRA) 400-80 MG tablet, Take 0.5 tablets by mouth daily., Disp: , Rfl: 1 .  venlafaxine XR (EFFEXOR-XR) 150 MG 24 hr capsule, Take 1 capsule (150 mg total) by mouth daily with breakfast., Disp: 90 capsule, Rfl: 3  EXAM:  VITALS per patient if applicable: None.  GENERAL: alert, oriented, appears well and in no acute distress  HEENT: atraumatic, conjunttiva clear,  no obvious abnormalities on inspection of external nose and ears  NECK: normal movements of the head and neck  LUNGS: on inspection no signs of respiratory distress, breathing rate appears normal, no obvious gross SOB, gasping or wheezing  CV: no obvious cyanosis  MS: moves all visible extremities without noticeable abnormality  PSYCH/NEURO: pleasant and cooperative, no obvious depression or anxiety, speech and thought processing grossly intact  ASSESSMENT AND PLAN:  Discussed the following assessment and plan:  Benign essential HTN Blood pressure  is well controlled off of medication.  She will continue to periodically monitor it and let us know if it trends up.  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  She will monitor for any new symptoms.  Neurogenic bladder No issues with recurrent UTI on her current regimen of Bactrim.  She will monitor for any UTI symptoms.    I discussed the assessment and treatment plan with the patient. The patient was provided an opportunity to ask questions and all were answered. The patient agreed with the plan and demonstrated an understanding of the instructions.   The patient was advised to call back or seek an in-person evaluation if the symptoms worsen or if the condition fails to improve as anticipated.    Bridget Rumps, MD

## 2019-10-30 ENCOUNTER — Other Ambulatory Visit: Payer: Self-pay | Admitting: Obstetrics and Gynecology

## 2019-10-30 DIAGNOSIS — Z1231 Encounter for screening mammogram for malignant neoplasm of breast: Secondary | ICD-10-CM

## 2019-11-19 ENCOUNTER — Other Ambulatory Visit: Payer: Self-pay | Admitting: Family Medicine

## 2019-11-19 DIAGNOSIS — I10 Essential (primary) hypertension: Secondary | ICD-10-CM

## 2019-11-29 ENCOUNTER — Ambulatory Visit
Admission: RE | Admit: 2019-11-29 | Discharge: 2019-11-29 | Disposition: A | Discharge status: Home / Self Care | Payer: Federal, State, Local not specified - PPO | Source: Ambulatory Visit | Attending: Obstetrics and Gynecology | Admitting: Obstetrics and Gynecology

## 2019-11-29 ENCOUNTER — Ambulatory Visit (INDEPENDENT_AMBULATORY_CARE_PROVIDER_SITE_OTHER): Payer: Federal, State, Local not specified - PPO | Admitting: Obstetrics and Gynecology

## 2019-11-29 ENCOUNTER — Encounter: Payer: Self-pay | Admitting: Obstetrics and Gynecology

## 2019-11-29 ENCOUNTER — Other Ambulatory Visit: Payer: Self-pay

## 2019-11-29 VITALS — BP 120/80 | Ht 68.0 in | Wt 271.0 lb

## 2019-11-29 DIAGNOSIS — Z1231 Encounter for screening mammogram for malignant neoplasm of breast: Secondary | ICD-10-CM | POA: Insufficient documentation

## 2019-11-29 DIAGNOSIS — Z01419 Encounter for gynecological examination (general) (routine) without abnormal findings: Secondary | ICD-10-CM

## 2019-11-29 DIAGNOSIS — F419 Anxiety disorder, unspecified: Secondary | ICD-10-CM

## 2019-11-29 DIAGNOSIS — Z3041 Encounter for surveillance of contraceptive pills: Secondary | ICD-10-CM

## 2019-11-29 MED ORDER — NORETHINDRONE 0.35 MG PO TABS: 1.0000 | ORAL_TABLET | Freq: Every day | ORAL | 3 refills | Status: DC

## 2019-11-29 MED ORDER — VENLAFAXINE HCL ER 150 MG PO CP24: 150.0000 mg | ORAL_CAPSULE | Freq: Every day | ORAL | 3 refills | Status: DC

## 2019-11-29 NOTE — Progress Notes (Signed)
PCP:  Leone Haven, MD   Chief Complaint  Patient presents with  . Gynecologic Exam     HPI:      [name] is a 44 y.o. G0P0000 who LMP was Patient's last menstrual period was 11/24/2019 (exact date)., presents today for her annual examination.  Her menses are regular every 28-30 days, lasting 6 days.  Dysmenorrhea mild, no BTB.  Sex activity: single partner, contraception - oral progesterone-only contraceptive.  Last Pap: 08/30/17 Results were: no abnormalities /neg HPV DNA  Hx of STDs: none  Mammogram: 11/16/18 Results were normal, repeat in 12 months. Has appt today. There is a FH of breast cancer in her mat aunt, who is BRCA/gene panel neg 2018. Pt doesn't qualify for cancer genetic testing due to age of dx of mat aunt. There is no FH of ovarian cancer. The patient does do self-breast exams.  Tobacco use: The patient denies current or previous tobacco use. Alcohol use: social drinker No drug use.  Exercise: very active  She does get adequate calcium and Vitamin D in her diet.  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 No side effects. Wants to continue Rx.  Labs with PCP. HTN from last yr improved. Pt working from home and exercising more now.  Past Medical History:  Diagnosis Date  . Alopecia   . Alopecia   . Blood transfusion without reported diagnosis 1978  . H/O blood clots   . Hyperlipidemia   . Hypertension   . Neurogenic bladder   . Neurogenic bladder   . Pulmonary embolism ([place]) 08/2015  . Urinary tract infection   . Urine incontinence     Past Surgical History:  Procedure Laterality Date  . APPENDECTOMY  07/2018  . CHOLECYSTECTOMY  06/2018  . DILATION AND CURETTAGE OF UTERUS    . 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 Hyperlipidemia Mother   . Arthritis Father   . Hyperlipidemia Father   . Heart disease Father   . Hypertension Father    . Cancer Maternal Grandfather        Colon/Prostate Cancer  . Diabetes Paternal Grandfather   . Alcohol abuse Sister   . Drug abuse Sister   . Mental illness Sister        BiPolar  . Cancer Maternal Aunt        Breast Cancer  . Breast cancer Maternal Aunt 47    Social History   Socioeconomic History  . Marital status: Married    Spouse name: Not on file  . Number of children: Not on file  . Years of education: Not on file  . Highest education level: Not on file  Occupational History  . Not on file  Tobacco Use  . Smoking status: Never Smoker  . Smokeless tobacco: Never Used  Substance and Sexual Activity  . Alcohol use: Yes    Alcohol/week: 0.0 standard drinks    Comment: 1 drink every other week   . Drug use: No  . Sexual activity: Yes    Partners: Male    Birth control/protection: Pill    Comment: Husband   Other Topics Concern  . 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    No children  5 dogs   Caffeine- Coffee 1 cup daily, tea- depends, soda every other day   Social Determinants of Health   Financial Resource Strain:   . Difficulty of Paying Living Expenses: Not on file  Food Insecurity:   . Worried About Charity fundraiser in the Last Year: Not on file  . Ran Out of Food in the Last Year: Not on file  Transportation Needs:   . Lack of Transportation (Medical): Not on file  . Lack of Transportation (Non-Medical): Not on file  Physical Activity:   . Days of Exercise per Week: Not on file  . Minutes of Exercise per Session: Not on file  Stress:   . Feeling of Stress : Not on file  Social Connections:   . Frequency of Communication with Friends and Family: Not on file  . Frequency of Social Gatherings with Friends and Family: Not on file  . Attends Religious Services: Not on file  . Active Member of Clubs or Organizations: Not on file  . Attends Theatre manager Meetings: Not on file  . Marital Status: Not on file  Intimate Partner Violence:   . Fear of Current or Ex-Partner: Not on file  . Emotionally Abused: Not on file  . Physically Abused: Not on file  . Sexually Abused: Not on file    Current Meds  Medication Sig  . norethindrone (CAMILA) 0.35 MG tablet Take 1 tablet (0.35 mg total) by mouth daily.  Marland Kitchen sulfamethoxazole-trimethoprim (BACTRIM,SEPTRA) 400-80 MG tablet Take 0.5 tablets by mouth daily.  Marland Kitchen venlafaxine XR (EFFEXOR-XR) 150 MG 24 hr capsule Take 1 capsule (150 mg total) by mouth daily with breakfast.  . [DISCONTINUED] norethindrone (CAMILA) 0.35 MG tablet Take 1 tablet (0.35 mg total) by mouth daily. Reported on 10/21/2015  . [DISCONTINUED] venlafaxine XR (EFFEXOR-XR) 150 MG 24 hr capsule Take 1 capsule (150 mg total) by mouth daily with breakfast.     ROS:  Review of Systems  Constitutional: Negative for fatigue, fever and unexpected weight change.  Respiratory: Negative for cough, shortness of breath and wheezing.   Cardiovascular: Negative for chest pain, palpitations and leg swelling.  Gastrointestinal: Negative for blood in stool, constipation, diarrhea, nausea and vomiting.  Endocrine: Negative for cold intolerance, heat intolerance and polyuria.  Genitourinary: Positive for frequency. Negative for dyspareunia, dysuria, flank pain, genital sores, hematuria, menstrual problem, pelvic pain, urgency, vaginal bleeding, vaginal discharge and vaginal pain.  Musculoskeletal: Negative for back pain, joint swelling and myalgias.  Skin: Negative for rash.  Neurological: Negative for dizziness, syncope, light-headedness, numbness and headaches.  Hematological: Negative for adenopathy.  Psychiatric/Behavioral: Positive for agitation. Negative for confusion, sleep disturbance and suicidal ideas. The patient is not nervous/anxious.      Objective: BP 120/80   Ht _0  (1.727 m)   Wt 271 lb (122.9 kg)   LMP 11/24/2019  (Exact Date)   BMI 41.21 kg/m    Physical Exam Constitutional:      Appearance: She is well-developed.  Genitourinary:     Vulva, vagina, uterus, right adnexa and left adnexa normal.     No vulval lesion or tenderness noted.     No vaginal discharge, erythema or tenderness.     No cervical motion tenderness or polyp.     Uterus is not enlarged or tender.     No right or left adnexal mass present.     Right adnexa not tender.     Left adnexa not tender.  Neck:  Thyroid: No thyromegaly.  Cardiovascular:     Rate and Rhythm: Normal rate and regular rhythm.     Heart sounds: Normal heart sounds. No murmur.  Pulmonary:     Effort: Pulmonary effort is normal.     Breath sounds: Normal breath sounds.  Chest:     Breasts:        Right: No mass, nipple discharge, skin change or tenderness.        Left: No mass, nipple discharge, skin change or tenderness.  Abdominal:     Palpations: Abdomen is soft.     Tenderness: There is no abdominal tenderness. There is no guarding.  Musculoskeletal:        General: Normal range of motion.     Cervical back: Normal range of motion.  Neurological:     General: No focal deficit present.     Mental Status: She is alert and oriented to person, place, and time.     Cranial Nerves: No cranial nerve deficit.  Skin:    General: Skin is warm and dry.  Psychiatric:        Mood and Affect: Mood normal.        Behavior: Behavior normal.        Thought Content: Thought content normal.        Judgment: Judgment normal.  Vitals reviewed.     Assessment/Plan: Encounter for annual routine gynecological examination  Encounter for surveillance of contraceptive pills - OCP RF.  - Plan: norethindrone (CAMILA) 0.35 MG tablet  Encounter for screening mammogram for malignant neoplasm of breast; pt had mammo today  Anxiety - Rx RF effexor.  - Plan: venlafaxine XR (EFFEXOR-XR) 150 MG 24 hr capsule  Meds ordered this encounter  Medications  .  norethindrone (CAMILA) 0.35 MG tablet    Sig: Take 1 tablet (0.35 mg total) by mouth daily.    Dispense:  3 Package    Refill:  3    Order Specific Question:   Supervising Provider    Answer:   Gae Dry [id number]  . venlafaxine XR (EFFEXOR-XR) 150 MG 24 hr capsule    Sig: Take 1 capsule (150 mg total) by mouth daily with breakfast.    Dispense:  90 capsule    Refill:  3    Order Specific Question:   Supervising Provider    Answer:   Gae Dry [[id number]]              GYN counsel mammography screening, adequate intake of calcium and vitamin D, diet and exercise     F/U  Return in about 1 year (around 11/28/2020).  Sonu Kruckenberg B. Jayceon Troy, PA-C 11/29/2019 4:06 PM

## 2019-11-29 NOTE — Patient Instructions (Signed)
I value your feedback and entrusting us with your care. If you get a [place] patient survey, I would appreciate you taking the time to let us know about your experience today. Thank you!  As of October 12, 2019, your lab results will be released to your MyChart immediately, before I even have a chance to see them. Please give me time to review them and contact you if there are any abnormalities. Thank you for your patience.  

## 2019-11-30 ENCOUNTER — Encounter: Payer: Self-pay | Admitting: Obstetrics and Gynecology

## 2020-01-18 DIAGNOSIS — N3020 Other chronic cystitis without hematuria: Secondary | ICD-10-CM | POA: Diagnosis not present

## 2020-01-18 DIAGNOSIS — N319 Neuromuscular dysfunction of bladder, unspecified: Secondary | ICD-10-CM | POA: Diagnosis not present

## 2020-05-28 ENCOUNTER — Telehealth: Payer: Self-pay | Admitting: Family Medicine

## 2020-05-28 NOTE — Telephone Encounter (Signed)
Left message for pt to reschedule appt

## 2020-05-29 ENCOUNTER — Encounter: Payer: Federal, State, Local not specified - PPO | Admitting: Family Medicine

## 2020-08-05 ENCOUNTER — Encounter: Payer: Federal, State, Local not specified - PPO | Admitting: Family Medicine

## 2020-08-30 ENCOUNTER — Ambulatory Visit (INDEPENDENT_AMBULATORY_CARE_PROVIDER_SITE_OTHER): Payer: Federal, State, Local not specified - PPO | Admitting: Family Medicine

## 2020-08-30 ENCOUNTER — Other Ambulatory Visit: Payer: Self-pay

## 2020-08-30 ENCOUNTER — Encounter: Payer: Self-pay | Admitting: Family Medicine

## 2020-08-30 VITALS — BP 118/80 | HR 74 | Temp 98.9°F | Ht 68.0 in | Wt 277.2 lb

## 2020-08-30 DIAGNOSIS — Z1329 Encounter for screening for other suspected endocrine disorder: Secondary | ICD-10-CM

## 2020-08-30 DIAGNOSIS — Z130 Encounter for screening for diseases of the blood and blood-forming organs and certain disorders involving the immune mechanism: Secondary | ICD-10-CM | POA: Diagnosis not present

## 2020-08-30 DIAGNOSIS — Z23 Encounter for immunization: Secondary | ICD-10-CM

## 2020-08-30 DIAGNOSIS — Z0001 Encounter for general adult medical examination with abnormal findings: Secondary | ICD-10-CM

## 2020-08-30 DIAGNOSIS — Z1159 Encounter for screening for other viral diseases: Secondary | ICD-10-CM | POA: Diagnosis not present

## 2020-08-30 DIAGNOSIS — E785 Hyperlipidemia, unspecified: Secondary | ICD-10-CM | POA: Diagnosis not present

## 2020-08-30 DIAGNOSIS — D229 Melanocytic nevi, unspecified: Secondary | ICD-10-CM

## 2020-08-30 LAB — LIPID PANEL
Cholesterol: 193 mg/dL (ref 0–200)
HDL: 52.8 mg/dL (ref >39.00)
LDL Cholesterol: 130 mg/dL — ABNORMAL HIGH (ref 0–99)
NonHDL: 139.96
Total CHOL/HDL Ratio: 4
Triglycerides: 52 mg/dL (ref 0.0–149.0)
VLDL: 10.4 mg/dL (ref 0.0–40.0)

## 2020-08-30 LAB — COMPREHENSIVE METABOLIC PANEL
ALT: 17 U/L (ref 0–35)
AST: 18 U/L (ref 0–37)
Albumin: 4.4 g/dL (ref 3.5–5.2)
Alkaline Phosphatase: 92 U/L (ref 39–117)
BUN: 13 mg/dL (ref 6–23)
CO2: 23 mEq/L (ref 19–32)
Calcium: 8.9 mg/dL (ref 8.4–10.5)
Chloride: 106 mEq/L (ref 96–112)
Creatinine, Ser: 0.86 mg/dL (ref 0.40–1.20)
GFR: 82.82 mL/min (ref >60.00)
Glucose, Bld: 87 mg/dL (ref 70–99)
Potassium: 4.2 mEq/L (ref 3.5–5.1)
Sodium: 137 mEq/L (ref 135–145)
Total Bilirubin: 0.4 mg/dL (ref 0.2–1.2)
Total Protein: 6.9 g/dL (ref 6.0–8.3)

## 2020-08-30 LAB — CBC
HCT: 40.1 % (ref 36.0–46.0)
Hemoglobin: 13.3 g/dL (ref 12.0–15.0)
MCHC: 33.3 g/dL (ref 30.0–36.0)
MCV: 96.2 fl (ref 78.0–100.0)
Platelets: 296 10*3/uL (ref 150.0–400.0)
RBC: 4.17 Mil/uL (ref 3.87–5.11)
RDW: 13.6 % (ref 11.5–15.5)
WBC: 7.4 10*3/uL (ref 4.0–10.5)

## 2020-08-30 LAB — HEMOGLOBIN A1C: Hgb A1c MFr Bld: 5.6 % (ref 4.6–6.5)

## 2020-08-30 LAB — TSH: TSH: 1.99 u[IU]/mL (ref 0.35–4.50)

## 2020-08-30 NOTE — Patient Instructions (Signed)
Nice to see you. Please continue to work on diet and exercise. Please make sure you get your mammogram done in January.  Let us know if your gynecologist does not order this for you. We will get lab work today and contact you with results.

## 2020-08-30 NOTE — Assessment & Plan Note (Addendum)
Physical exam completed.  Discussed continuing healthy diet and exercise.  Pelvic and breast exam deferred to gynecology who she will see in January.  Encouraged her to get her mammogram in January.  Vaccines up-to-date.  Hepatitis C screening will be completed.  Lab work as outlined below.  Discussed colon cancer screening would start at age 44.

## 2020-08-30 NOTE — Progress Notes (Signed)
[name], MD Phone: [phone]  Bridget Gould is a 44 y.o. female who presents today for CPE.  Diet: Generally healthy.  Eats lean meats.  Minimizes carbs.  Eats lots of vegetables.  No soda or sweet tea. Exercise: Does exercises at home 5 days a week. Pap smear: 08/30/2017, NILM negative for HPV, does have a history of abnormal Pap smear, follows with GYN. Colonoscopy: Not indicated Mammogram: Up-to-date Family history-  Colon cancer: Maternal grandfather, none in a first-degree relative  Breast cancer: Maternal aunt at age 43  Ovarian cancer: No Menses: Once monthly lasting 6 days Vaccines-   Flu: Given today  Tetanus: Up-to-date  COVID19: Up-to-date HIV screening: Up-to-date Hep C Screening: Due Tobacco use: No Alcohol use: 1-2 beverages every 2 weeks Illicit Drug use: No Dentist: Yes Ophthalmology: Yes  Skin lesion: This is on her left forehead.  It is a white spot.  Its been present for about a year.  It has not changed in size or color.  No pain.  No bleeding.  She has chronic neurogenic bladder.  No changes to those symptoms. Anxiety is well controlled on current medication.   Active Ambulatory Problems    Diagnosis Date Noted  . History of pulmonary embolus (PE) 09/02/2015  . Neurogenic bladder 09/02/2015  . Alopecia 09/02/2015  . Benign essential HTN 09/02/2015  . HLD (hyperlipidemia) 09/02/2015  . Plantar fasciitis 02/04/2016  . Arch pain 02/04/2016  . Anxiety and depression 04/23/2016  . Morbid obesity ([place]) 05/25/2016  . Encounter for general adult medical examination with abnormal findings 04/26/2017  . Tethered spinal cord ([place]) 10/28/2017  . History of appendectomy 10/28/2018  . History of cholecystectomy 10/28/2018  . Nevus 08/30/2020   Resolved Ambulatory Problems    Diagnosis Date Noted  . Acute bronchitis 09/02/2015  . Encounter for general adult medical examination with abnormal findings 04/23/2016   Past Medical History:    Diagnosis Date  . Blood transfusion without reported diagnosis 1978  . H/O blood clots   . Hyperlipidemia   . Hypertension   . Pulmonary embolism ([place]) 08/2015  . Urinary tract infection   . Urine incontinence     Family History  Problem Relation Age of Onset  . Hyperlipidemia Mother   . Arthritis Father   . Hyperlipidemia Father   . Heart disease Father   . Hypertension Father   . Cancer Maternal Grandfather        Colon/Prostate Cancer  . Diabetes Paternal Grandfather   . Alcohol abuse Sister   . Drug abuse Sister   . Mental illness Sister        [employer]  . Cancer Maternal Aunt        Breast Cancer  . Breast cancer Maternal Aunt 2    Social History   Socioeconomic History  . Marital status: Married    Spouse name: Not on file  . Number of children: Not on file  . Years of education: Not on file  . Highest education level: Not on file  Occupational History  . Not on file  Tobacco Use  . Smoking status: Never Smoker  . Smokeless tobacco: Never Used  Vaping Use  . Vaping Use: Never used  Substance and Sexual Activity  . Alcohol use: Yes    Alcohol/week: 0.0 standard drinks    Comment: 1 drink every other week   . Drug use: No  . Sexual activity: Yes    Partners: Male    Birth control/protection: Pill  Comment: Husband   Other Topics Concern  . 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    No children   5 dogs   Caffeine- Coffee 1 cup daily, tea- depends, soda every other day   Social Determinants of Health   Financial Resource Strain:   . Difficulty of Paying Living Expenses: Not on file  Food Insecurity:   . Worried About Charity fundraiser in the Last Year: Not on file  . Ran Out of Food in the Last Year: Not on file  Transportation Needs:   . Lack of Transportation (Medical): Not on file  . Lack of Transportation (Non-Medical): Not on file   Physical Activity:   . Days of Exercise per Week: Not on file  . Minutes of Exercise per Session: Not on file  Stress:   . Feeling of Stress : Not on file  Social Connections:   . Frequency of Communication with Friends and Family: Not on file  . Frequency of Social Gatherings with Friends and Family: Not on file  . Attends Religious Services: Not on file  . Active Member of Clubs or Organizations: Not on file  . Attends Archivist Meetings: Not on file  . Marital Status: Not on file  Intimate Partner Violence:   . Fear of Current or Ex-Partner: Not on file  . Emotionally Abused: Not on file  . Physically Abused: Not on file  . Sexually Abused: Not on file    ROS  General:  Negative for nexplained weight loss, fever Skin: Positive for new or changing mole, negative for sore that won't heal HEENT: Negative for trouble hearing, trouble seeing, ringing in ears, mouth sores, hoarseness, change in voice, dysphagia. CV:  Negative for chest pain, dyspnea, edema, palpitations Resp: Negative for cough, dyspnea, hemoptysis GI: Negative for nausea, vomiting, diarrhea, constipation, abdominal pain, melena, hematochezia. GU: Positive for incontinence and frequent urination related to neurogenic bladder, negative for dysuria, urinary hesitance, hematuria, vaginal or penile discharge, sexual difficulty, lumps in testicle or breasts MSK: Negative for muscle cramps or aches, joint pain or swelling Neuro: Negative for headaches, weakness, numbness, dizziness, passing out/fainting Psych: Negative for depression, anxiety, memory problems  Objective  Physical Exam Vitals:   08/30/20 0836  BP: 118/80  Pulse: 74  Temp: 98.9 F (37.2 C)  SpO2: 99%    BP Readings from Last 3 Encounters:  08/30/20 118/80  11/29/19 120/80  07/28/19 115/68   Wt Readings from Last 3 Encounters:  08/30/20 277 lb 3.2 oz (125.7 kg)  11/29/19 271 lb (122.9 kg)  07/28/19 283 lb (128.4 kg)    Physical  Exam Constitutional:      General: She is not in acute distress.    Appearance: She is not diaphoretic.  HENT:     Head: Normocephalic and atraumatic.   Eyes:     Conjunctiva/sclera: Conjunctivae normal.     Pupils: Pupils are equal, round, and reactive to light.  Cardiovascular:     Rate and Rhythm: Normal rate and regular rhythm.     Heart sounds: Normal heart sounds.  Pulmonary:     Effort: Pulmonary effort is normal.     Breath sounds: Normal breath sounds.  Abdominal:     General: Bowel sounds are normal. There is no distension.     Palpations: Abdomen is soft.     Tenderness:  There is no abdominal tenderness. There is no guarding or rebound.  Musculoskeletal:     Right lower leg: No edema.     Left lower leg: No edema.  Lymphadenopathy:     Cervical: No cervical adenopathy.  Skin:    General: Skin is warm and dry.  Neurological:     Mental Status: She is alert.  Psychiatric:        Mood and Affect: Mood normal.      Assessment/Plan:   Problem List Items Addressed This Visit    Encounter for general adult medical examination with abnormal findings - Primary    Physical exam completed.  Discussed continuing healthy diet and exercise.  Pelvic and breast exam deferred to gynecology who she will see in January.  Encouraged her to get her mammogram in January.  Vaccines up-to-date.  Hepatitis C screening will be completed.  Lab work as outlined below.  Discussed colon cancer screening would start at age 6.      HLD (hyperlipidemia)   Relevant Orders   Comp Met (CMET)   Lipid panel   Morbid obesity ([place])   Relevant Orders   HgB A1c   Nevus    Discussed the lesion on her forehead is likely a skin colored nevus.  Less likely a cyst.  Advised it appears benign.  She will monitor and if there are any changes she will let us know.       Other Visit Diagnoses    Need for immunization against influenza       Relevant Orders   Flu Vaccine QUAD 36+ mos IM (Completed)     Screening for deficiency anemia       Relevant Orders   CBC   Thyroid disorder screen       Relevant Orders   TSH   Need for hepatitis C screening test       Relevant Orders   Hepatitis C Antibody      This visit occurred during the SARS-CoV-2 public health emergency.  Safety protocols were in place, including screening questions prior to the visit, additional usage of staff PPE, and extensive cleaning of exam room while observing appropriate contact time as indicated for disinfecting solutions.    [name], MD [place]

## 2020-08-30 NOTE — Assessment & Plan Note (Signed)
Discussed the lesion on her forehead is likely a skin colored nevus.  Less likely a cyst.  Advised it appears benign.  She will monitor and if there are any changes she will let us know.

## 2020-09-02 LAB — HEPATITIS C ANTIBODY
Hepatitis C Ab: NONREACTIVE
SIGNAL TO CUT-OFF: 0.05 (ref <1.00)

## 2020-09-12 DIAGNOSIS — Z1152 Encounter for screening for COVID-19: Secondary | ICD-10-CM | POA: Diagnosis not present

## 2020-09-12 DIAGNOSIS — R0981 Nasal congestion: Secondary | ICD-10-CM | POA: Diagnosis not present

## 2020-09-12 DIAGNOSIS — J069 Acute upper respiratory infection, unspecified: Secondary | ICD-10-CM | POA: Diagnosis not present

## 2020-10-01 ENCOUNTER — Other Ambulatory Visit: Payer: Self-pay | Admitting: Obstetrics and Gynecology

## 2020-10-01 DIAGNOSIS — Z3041 Encounter for surveillance of contraceptive pills: Secondary | ICD-10-CM

## 2020-10-29 ENCOUNTER — Other Ambulatory Visit: Payer: Self-pay | Admitting: Obstetrics and Gynecology

## 2020-10-29 DIAGNOSIS — Z3041 Encounter for surveillance of contraceptive pills: Secondary | ICD-10-CM

## 2020-11-12 ENCOUNTER — Other Ambulatory Visit: Payer: Self-pay | Admitting: Obstetrics and Gynecology

## 2020-11-12 DIAGNOSIS — F419 Anxiety disorder, unspecified: Secondary | ICD-10-CM

## 2020-11-12 NOTE — Telephone Encounter (Signed)
Annual 12/05/20

## 2020-11-15 ENCOUNTER — Other Ambulatory Visit: Payer: Self-pay | Admitting: Obstetrics and Gynecology

## 2020-11-15 DIAGNOSIS — Z1231 Encounter for screening mammogram for malignant neoplasm of breast: Secondary | ICD-10-CM

## 2020-11-21 ENCOUNTER — Encounter: Payer: Self-pay | Admitting: Dermatology

## 2020-11-21 ENCOUNTER — Ambulatory Visit: Payer: Federal, State, Local not specified - PPO | Admitting: Dermatology

## 2020-11-21 ENCOUNTER — Other Ambulatory Visit: Payer: Self-pay

## 2020-11-21 DIAGNOSIS — L720 Epidermal cyst: Secondary | ICD-10-CM

## 2020-11-21 DIAGNOSIS — D485 Neoplasm of uncertain behavior of skin: Secondary | ICD-10-CM | POA: Diagnosis not present

## 2020-11-21 DIAGNOSIS — B079 Viral wart, unspecified: Secondary | ICD-10-CM

## 2020-11-21 DIAGNOSIS — L918 Other hypertrophic disorders of the skin: Secondary | ICD-10-CM | POA: Diagnosis not present

## 2020-11-21 DIAGNOSIS — D2262 Melanocytic nevi of left upper limb, including shoulder: Secondary | ICD-10-CM

## 2020-11-21 DIAGNOSIS — L639 Alopecia areata, unspecified: Secondary | ICD-10-CM

## 2020-11-21 DIAGNOSIS — L630 Alopecia (capitis) totalis: Secondary | ICD-10-CM

## 2020-11-21 DIAGNOSIS — D489 Neoplasm of uncertain behavior, unspecified: Secondary | ICD-10-CM

## 2020-11-21 NOTE — Progress Notes (Signed)
New Patient Visit  Subjective  Easton is a 45 y.o. female who presents for the following: Skin Tag (Patient here today with concerns about 2 areas on left clavicle area. Patient states they bother her and interfere with sports bra. Patient also has noticed a place on left frontal scalp. Patient states she noticed a month ago. Patient reports the area is rough. She noticed this morning white bumps around eye. She reports bumps are hard and aggravating. ).    Objective  Well appearing patient in no apparent distress; mood and affect are within normal limits.  A focused examination was performed including bilateral eyes, face, scalp, left shoulder/clavicle . Relevant physical exam findings are noted in the Assessment and Plan.  Objective  bilateral eyes: Smooth white papules  Objective  Scalp: Verrucous papules  Objective  left superior shoulder: Left superior shoulder 0.6 cm erythematous tan papule        Objective  left base of neck: Left base of neck  0.5 cm erythematous tan papule        Objective  Scalp: Scalp with widespread non-scarring alopecia  Assessment & Plan  Milia bilateral eyes  Benign-appearing  Start OTC Differin (adapalene) 0.1 % acne treatment found at [facility], [facility], etc. Apply a small amount to affected areas at night. If getting irritated, use smaller amount and less often    Viral warts, unspecified type Scalp  Discussed viral etiology and risk of spread.  Discussed multiple treatments may be required to clear warts.  Discussed possible post-treatment dyspigmentation and risk of recurrence.  Cantharidin is a blistering agent that comes from a beetle.  It needs to be washed off in about 4 hours after application.  Although it is painless when applied in office, it may cause symptoms of mild pain and burning several hours later.  Treated areas will swell and turn red, and blisters may form.  Vaseline and a bandaid may be  applied until wound has healed.  Once healed, the skin may remain temporarily discolored.  It can take weeks to months for pigmentation to return to normal.   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 Anesthesia: the lesion was anesthetized in a standard fashion   Anesthetic:  1% lidocaine w/ epinephrine 1-100,000 buffered w/ 8.4% NaHCO3 Instrument used: DermaBlade   Hemostasis achieved with: aluminum chloride   Outcome: patient tolerated procedure well   Post-procedure details: wound care instructions given   Additional details:  Mupirocin and a bandage applied  Specimen 1 - Surgical pathology Differential Diagnosis: r/o irritate nevus vs acrochordon vs neurofibroma  Check Margins: No Left superior shoulder 0.6 cm erythematous tan papule   left base of neck  Epidermal / dermal shaving  Lesion diameter (cm):  0.5 Informed consent:  discussed and consent obtained   Timeout: patient name, date of birth, surgical site, and procedure verified   Patient was prepped and draped in usual sterile fashion: area prepped with isopropyl alcohol. Anesthesia: the lesion was anesthetized in a standard fashion    Anesthetic:  1% lidocaine w/ epinephrine 1-100,000 buffered w/ 8.4% NaHCO3 Instrument used: DermaBlade   Hemostasis achieved with: aluminum chloride   Outcome: patient tolerated procedure well   Post-procedure details: wound care instructions given   Additional details:  Mupirocin and a bandage applied  Specimen 2 - Surgical pathology Differential Diagnosis: r/o irritate nevus vs acrochordon vs neurofibroma  Check Margins: No Left base of neck  0.5 cm erythematous tan papule   r/o irritate nevus vs acrochordon vs neurofibroma   Alopecia totalis Scalp  Chronic, 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 Previously tried oral steroids, a variety of topicals without resolution. She inquires about any new therapeutic options today.  Discussed option of oral Jak inhibitor, pulse steroids. However, she has a history of pulmonary embolus and Jak inhibitors increase risk of clots so would be contraindicated. She tried oral steroids previously without improvement. Will defer treatment at this time.   Return in about 1 month (around 12/22/2020) for wart follow up .  I, Ruthell Rummage, CMA, am acting as scribe for Forest Gleason, MD.   Documentation: I have reviewed the above documentation for accuracy and completeness, and I agree with the above.  Forest Gleason, MD

## 2020-11-21 NOTE — Patient Instructions (Addendum)
Melanoma ABCDEs  Melanoma is the most dangerous type of skin cancer, and is the leading cause of death from skin disease.  You are more likely to develop melanoma if you:  Have light-colored skin, light-colored eyes, or red or blond hair  Spend a lot of time in the sun  Tan regularly, either outdoors or in a tanning bed  Have had blistering sunburns, especially during childhood  Have a close family member who has had a melanoma  Have atypical moles or large birthmarks  Early detection of melanoma is key since treatment is typically straightforward and cure rates are extremely high if we catch it early.   The first sign of melanoma is often a change in a mole or a new dark spot.  The ABCDE system is a way of remembering the signs of melanoma.  A for asymmetry:  The two halves do not match. B for border:  The edges of the growth are irregular. C for color:  A mixture of colors are present instead of an even brown color. D for diameter:  Melanomas are usually (but not always) greater than 10mm - the size of a pencil eraser. E for evolution:  The spot keeps changing in size, shape, and color.  Please check your skin once per month between visits. You can use a small mirror in front and a large mirror behind you to keep an eye on the back side or your body.   If you see any new or changing lesions before your next follow-up, please call to schedule a visit.  Please continue daily skin protection including broad spectrum sunscreen SPF 30+ to sun-exposed areas, reapplying every 2 hours as needed when you're outdoors.   Recommend taking Heliocare sun protection supplement daily in sunny weather for additional sun protection. For maximum protection on the sunniest days, you can take up to 2 capsules of regular Heliocare OR take 1 capsule of Heliocare Ultra. For prolonged exposure (such as a full day in the sun), you can repeat your dose of the supplement 4 hours after your first dose. Heliocare  can be purchased at [facility] or at [email or web address].   Viral Warts & Molluscum Contagiosum  Viral warts and molluscum contagiosum are growths of the skin caused by viral infection of the skin. If you have been given the diagnosis of viral warts or molluscum contagiosum there are a few things that you must understand about your condition:  1. There is no guaranteed treatment method available for this condition. 2. Multiple treatments may be required, 3. The treatments may be time consuming and require multiple visits to the dermatology office. 4. The treatment may be expensive. You will be charged each time you come into the office to have the spots treated. 5. The treated areas may develop new lesions further complicating treatment. 6. The treated areas may leave a scar. 7. There is no guarantee that even after multiple treatments that the spots will be successfully treated. 8. These are caused by a viral infection and can be spread to other areas of the skin and to other people by direct contact. Therefore, new spots may occur.  Cantharidin is a blistering agent that comes from a beetle.  It needs to be washed off in about 4 hours after application.  Although it is painless when applied in office, it may cause symptoms of mild pain and burning several hours later.  Treated areas will swell and turn red, and blisters may form.  Vaseline  and a bandaid may be applied until wound has healed.  Once healed, the skin may remain temporarily discolored.  It can take weeks to months for pigmentation to return to normal.  The molluscum may resolve with this topical treatment, but often, additional treatments may be required to clear molluscum.  It is recommended to keep the skin well-moisturized and avoid scratching affected area to help prevent spread of the molluscum.  Biopsy Wound Care Instructions  1. Leave the original bandage on for 24 hours if possible.  If the bandage becomes soaked or  soiled before that time, it is OK to remove it and examine the wound.  A small amount of post-operative bleeding is normal.  If excessive bleeding occurs, remove the bandage, place gauze over the site and apply continuous pressure (no peeking) over the area for 30 minutes. If this does not work, please call our clinic as soon as possible or page your doctor if it is after hours.   2. Once a day, cleanse the wound with soap and water. It is fine to shower. If a thick crust develops you may use a Q-tip dipped into dilute hydrogen peroxide (mix 1:1 with water) to dissolve it.  Hydrogen peroxide can slow the healing process, so use it only as needed.    3. After washing, apply petroleum jelly (Vaseline) or an antibiotic ointment if your doctor prescribed one for you, followed by a bandage.    4. For best healing, the wound should be covered with a layer of ointment at all times. If you are not able to keep the area covered with a bandage to hold the ointment in place, this may mean re-applying the ointment several times a day.  Continue this wound care until the wound has healed and is no longer open.   Itching and mild discomfort is normal during the healing process. However, if you develop pain or severe itching, please call our office.   If you have any discomfort, you can take Tylenol (acetaminophen) or ibuprofen as directed on the bottle. (Please do not take these if you have an allergy to them or cannot take them for another reason).  Some redness, tenderness and white or yellow material in the wound is normal healing.  If the area becomes very sore and red, or develops a thick yellow-green material (pus), it may be infected; please notify us.    If you have stitches, return to clinic as directed to have the stitches removed. You will continue wound care for 2-3 days after the stitches are removed.   Wound healing continues for up to one year following surgery. It is not unusual to experience pain  in the scar from time to time during the interval.  If the pain becomes severe or the scar thickens, you should notify the office.    A slight amount of redness in a scar is expected for the first six months.  After six months, the redness will fade and the scar will soften and fade.  The color difference becomes less noticeable with time.  If there are any problems, return for a post-op surgery check at your earliest convenience.  To improve the appearance of the scar, you can use silicone scar gel, cream, or sheets (such as Mederma or Serica) every night for up to one year. These are available over the counter (without a prescription).  Please call our office at [phone] for any questions or concerns.  For eye area use Differin 0.1 %  acne treatment found at [facility], [facility], etc. Apply a small amount to eye area nightly

## 2020-11-21 NOTE — Progress Notes (Deleted)
   Follow-Up Visit   Subjective  Carrier is a 45 y.o. female who presents for the following: Skin Tag (Patient here today 2 areas on left clavicle area possible skin tags patient states they bother her and interfere with sports bra. Patient also has noticed a place on left frontal scalp. Patient states she noticed a month ago. Patient reports the area is rough. She noticed this morning white bumps around eye sock. She reports bumps are hard and aggravating. ).    The following portions of the chart were reviewed this encounter and updated as appropriate:        Objective  Well appearing patient in no apparent distress; mood and affect are within normal limits.  {[id number]::"V full examination was performed including scalp, head, eyes, ears, nose, lips, neck, chest, axillae, abdomen, back, buttocks, bilateral upper extremities, bilateral lower extremities, hands, feet, fingers, toes, fingernails, and toenails. All findings within normal limits unless otherwise noted below."}   Assessment & Plan  Acrochordon Neck - Anterior  Neoplasm of uncertain behavior Scalp  Milia Right Eye   No follow-ups on file.  I, Ruthell Rummage, CMA, am acting as scribe for Forest Gleason, MD.

## 2020-12-02 ENCOUNTER — Encounter: Payer: Self-pay | Admitting: Family Medicine

## 2020-12-04 NOTE — Progress Notes (Signed)
PCP:  Leone Haven, MD   Chief Complaint  Patient presents with  . Gynecologic Exam    No concerns     HPI:      Bridget Gould is a 45 y.o. G0P0000 who LMP was Patient's last menstrual period was 11/07/2020 (exact date)., presents today for her annual examination.  Her menses are regular every 28-30 days, lasting 6 days.  Dysmenorrhea mild, no BTB. On POPs.  Sex activity: single partner, contraception - oral progesterone-only contraceptive.  Last Pap: 08/30/17 Results were: no abnormalities /neg HPV DNA  Hx of STDs: none  Mammogram: 11/29/19 Results were normal, repeat in 12 months. Has appt 2/22 There is a FH of breast cancer in her mat aunt, who is BRCA/gene panel neg 2018. Pt doesn't qualify for cancer genetic testing due to age of dx of mat aunt. There is no FH of ovarian cancer. The patient does do self-breast exams.  Tobacco use: The patient denies current or previous tobacco use. Alcohol use: social drinker No drug use.  Exercise: very active  She does get adequate calcium and Vitamin D in her diet.  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 No side effects. Wants to continue Rx.  Labs with PCP. HTN  improved. Pt working from home and exercising more now.  Past Medical History:  Diagnosis Date  . Alopecia   . Alopecia   . Blood transfusion without reported diagnosis 1978  . H/O blood clots   . Hyperlipidemia   . Hypertension   . Neurogenic bladder   . Neurogenic bladder   . Pulmonary embolism ([place]) 08/2015  . Urinary tract infection   . Urine incontinence     Past Surgical History:  Procedure Laterality Date  . APPENDECTOMY  07/2018  . CHOLECYSTECTOMY  06/2018  . DILATION AND CURETTAGE OF UTERUS    . 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 Hyperlipidemia Mother   . Arthritis Father   . Hyperlipidemia Father   . Heart disease Father   .  Hypertension Father   . Cancer Maternal Grandfather        Colon/Prostate Cancer  . Diabetes Paternal Grandfather   . Alcohol abuse Sister   . Drug abuse Sister   . Mental illness Sister        [employer]  . Cancer Maternal Aunt        Breast Cancer  . Breast cancer Maternal Aunt 64    Social History   Socioeconomic History  . Marital status: Married    Spouse name: Not on file  . Number of children: Not on file  . Years of education: Not on file  . Highest education level: Not on file  Occupational History  . Not on file  Tobacco Use  . Smoking status: Never Smoker  . Smokeless tobacco: Never Used  Vaping Use  . Vaping Use: Never used  Substance and Sexual Activity  . Alcohol use: Yes    Alcohol/week: 0.0 standard drinks    Comment: 1 drink every other week   . Drug use: No  . Sexual activity: Yes    Partners: Male    Birth control/protection: Pill    Comment: Husband   Other Topics Concern  . Not on file  Social History Narrative   Married   Employed    4 years [employer]  Supervisor at [employer]    No children   5 dogs   Caffeine- Coffee 1 cup daily, tea- depends, soda every other day   Social Determinants of Health   Financial Resource Strain: Not on file  Food Insecurity: Not on file  Transportation Needs: Not on file  Physical Activity: Not on file  Stress: Not on file  Social Connections: Not on file  Intimate Partner Violence: Not on file    Current Meds  Medication Sig  . sulfamethoxazole-trimethoprim (BACTRIM,SEPTRA) 400-80 MG tablet Take 0.5 tablets by mouth daily.  . [DISCONTINUED] norethindrone (MICRONOR) 0.35 MG tablet TAKE 1 TABLET BY MOUTH EVERY DAY  . [DISCONTINUED] venlafaxine XR (EFFEXOR-XR) 150 MG 24 hr capsule TAKE 1 CAPSULE BY MOUTH EVERY DAY WITH BREAKFAST     ROS:  Review of Systems  Constitutional: Negative for fatigue, fever and unexpected weight change.  Respiratory: Negative for cough, shortness  of breath and wheezing.   Cardiovascular: Negative for chest pain, palpitations and leg swelling.  Gastrointestinal: Negative for blood in stool, constipation, diarrhea, nausea and vomiting.  Endocrine: Negative for cold intolerance, heat intolerance and polyuria.  Genitourinary: Positive for frequency. Negative for dyspareunia, dysuria, flank pain, genital sores, hematuria, menstrual problem, pelvic pain, urgency, vaginal bleeding, vaginal discharge and vaginal pain.  Musculoskeletal: Negative for back pain, joint swelling and myalgias.  Skin: Negative for rash.  Neurological: Negative for dizziness, syncope, light-headedness, numbness and headaches.  Hematological: Negative for adenopathy.  Psychiatric/Behavioral: Negative for agitation, confusion, sleep disturbance and suicidal ideas. The patient is not nervous/anxious.      Objective: BP 130/70   Ht 5' 8" (1.727 m)   Wt 286 lb (129.7 kg)   LMP 11/07/2020 (Exact Date)   BMI 43.49 kg/m    Physical Exam Constitutional:      Appearance: She is well-developed.  Genitourinary:     Vulva normal.     Right Labia: No rash, tenderness or lesions.    Left Labia: No tenderness, lesions or rash.    No vaginal discharge, erythema or tenderness.      Right Adnexa: not tender and no mass present.    Left Adnexa: not tender and no mass present.    No cervical motion tenderness, friability or polyp.     Uterus is not enlarged or tender.  Breasts:     Right: No mass, nipple discharge, skin change or tenderness.     Left: No mass, nipple discharge, skin change or tenderness.    Neck:     Thyroid: No thyromegaly.  Cardiovascular:     Rate and Rhythm: Normal rate and regular rhythm.     Heart sounds: Normal heart sounds. No murmur heard.   Pulmonary:     Effort: Pulmonary effort is normal.     Breath sounds: Normal breath sounds.  Abdominal:     Palpations: Abdomen is soft.     Tenderness: There is no abdominal tenderness. There is  no guarding or rebound.  Musculoskeletal:        General: Normal range of motion.     Cervical back: Normal range of motion.  Lymphadenopathy:     Cervical: No cervical adenopathy.  Neurological:     General: No focal deficit present.     Mental Status: She is alert and oriented to person, place, and time.     Cranial Nerves: No cranial nerve deficit.  Skin:    General: Skin is warm and dry.  Psychiatric:  Mood and Affect: Mood normal.        Behavior: Behavior normal.        Thought Content: Thought content normal.        Judgment: Judgment normal.  Vitals reviewed.     Assessment/Plan: Encounter for annual routine gynecological examination  Encounter for screening mammogram for malignant neoplasm of breast; pt has appt  Encounter for surveillance of contraceptive pills - Plan: norethindrone (MICRONOR) 0.35 MG tablet; Rx RF.  Anxiety - Plan: venlafaxine XR (EFFEXOR-XR) 150 MG 24 hr capsule; Rx RF, doing well   Meds ordered this encounter  Medications  . venlafaxine XR (EFFEXOR-XR) 150 MG 24 hr capsule    Sig: TAKE 1 CAPSULE BY MOUTH EVERY DAY WITH BREAKFAST    Dispense:  90 capsule    Refill:  3    Order Specific Question:   Supervising Provider    Answer:   Gae Dry [id number]  . norethindrone (MICRONOR) 0.35 MG tablet    Sig: Take 1 tablet (0.35 mg total) by mouth daily.    Dispense:  84 tablet    Refill:  3    Order Specific Question:   Supervising Provider    Answer:   Gae Dry [[id number]]              GYN counsel mammography screening, adequate intake of calcium and vitamin D, diet and exercise     F/U  Return in about 1 year (around 12/05/2021).  Alicia B. Copland, PA-C 12/05/2020 8:34 AM

## 2020-12-04 NOTE — Patient Instructions (Incomplete)
I value your feedback and you entrusting us with your care. If you get a [facility] patient survey, I would appreciate you taking the time to let us know about your experience today. Thank you!  [facility]: [phone]      

## 2020-12-05 ENCOUNTER — Encounter: Payer: Self-pay | Admitting: Obstetrics and Gynecology

## 2020-12-05 ENCOUNTER — Other Ambulatory Visit: Payer: Self-pay

## 2020-12-05 ENCOUNTER — Ambulatory Visit (INDEPENDENT_AMBULATORY_CARE_PROVIDER_SITE_OTHER): Payer: Federal, State, Local not specified - PPO | Admitting: Obstetrics and Gynecology

## 2020-12-05 VITALS — BP 130/70 | Ht 68.0 in | Wt 286.0 lb

## 2020-12-05 DIAGNOSIS — F419 Anxiety disorder, unspecified: Secondary | ICD-10-CM | POA: Diagnosis not present

## 2020-12-05 DIAGNOSIS — Z3041 Encounter for surveillance of contraceptive pills: Secondary | ICD-10-CM | POA: Diagnosis not present

## 2020-12-05 DIAGNOSIS — Z01419 Encounter for gynecological examination (general) (routine) without abnormal findings: Secondary | ICD-10-CM

## 2020-12-05 DIAGNOSIS — Z1231 Encounter for screening mammogram for malignant neoplasm of breast: Secondary | ICD-10-CM

## 2020-12-05 MED ORDER — NORETHINDRONE 0.35 MG PO TABS: 1.0000 | ORAL_TABLET | Freq: Every day | ORAL | 3 refills | Status: DC

## 2020-12-05 MED ORDER — VENLAFAXINE HCL ER 150 MG PO CP24: ORAL_CAPSULE | ORAL | 3 refills | Status: DC

## 2020-12-09 ENCOUNTER — Ambulatory Visit
Admission: RE | Admit: 2020-12-09 | Discharge: 2020-12-09 | Disposition: A | Discharge status: Home / Self Care | Payer: Federal, State, Local not specified - PPO | Source: Ambulatory Visit | Attending: Obstetrics and Gynecology | Admitting: Obstetrics and Gynecology

## 2020-12-09 ENCOUNTER — Other Ambulatory Visit: Payer: Self-pay

## 2020-12-09 DIAGNOSIS — Z1231 Encounter for screening mammogram for malignant neoplasm of breast: Secondary | ICD-10-CM

## 2020-12-18 ENCOUNTER — Ambulatory Visit: Payer: Federal, State, Local not specified - PPO | Admitting: Dermatology

## 2020-12-25 ENCOUNTER — Other Ambulatory Visit: Payer: Self-pay

## 2020-12-25 ENCOUNTER — Encounter: Payer: Self-pay | Admitting: Dermatology

## 2020-12-25 ENCOUNTER — Ambulatory Visit: Payer: Federal, State, Local not specified - PPO | Admitting: Dermatology

## 2020-12-25 DIAGNOSIS — B078 Other viral warts: Secondary | ICD-10-CM

## 2020-12-25 DIAGNOSIS — L308 Other specified dermatitis: Secondary | ICD-10-CM

## 2020-12-25 DIAGNOSIS — L905 Scar conditions and fibrosis of skin: Secondary | ICD-10-CM

## 2020-12-25 MED ORDER — TRIAMCINOLONE ACETONIDE 0.1 % EX CREA: TOPICAL_CREAM | CUTANEOUS | 2 refills | Status: DC

## 2020-12-25 NOTE — Progress Notes (Signed)
   Follow-Up Visit   Subjective  Bridget Gould is a 45 y.o. female who presents for the following: Follow-up (Patient here today for 1 month follow up for wart at scalp treated with cantharidin at last visit. Patient advises it has resolved. Patient has some itching at areas that were biopsied at last visit at left shoulder. Biopsies showed acrochordon and benign nevus.).  The following portions of the chart were reviewed this encounter and updated as appropriate:   Tobacco  Allergies  Meds  Problems  Med Hx  Surg Hx  Fam Hx      Review of Systems:  No other skin or systemic complaints except as noted in HPI or Assessment and Plan.  Objective  Well appearing patient in no apparent distress; mood and affect are within normal limits.  A focused examination was performed including scalp, neck, left shoulder. Relevant physical exam findings are noted in the Assessment and Plan.  Objective  Scalp: Resolved   Objective  Neck - Anterior: erythematous papules  Objective  left shoulder: Thickened scar   Assessment & Plan  Other viral warts Scalp  S/p cantharidin  Other eczema Neck - Anterior  Start TMC 0.1% cream twice daily to AA as needed for rash/itch for up to 2 weeks. Avoid applying to face, groin, and axilla. Use as directed. Risk of skin atrophy with long-term use reviewed.   Topical steroids (such as triamcinolone, fluocinolone, fluocinonide, mometasone, clobetasol, halobetasol, betamethasone, hydrocortisone) can cause thinning and lightening of the skin if they are used for too long in the same area. Your physician has selected the right strength medicine for your problem and area affected on the body. Please use your medication only as directed by your physician to prevent side effects.    Ordered Medications: triamcinolone (KENALOG) 0.1 %  Scar left shoulder  Start TMC 0.1% cream to AA daily and cover with band aid or silicone scar sheet. Avoid applying  to face, groin, and axilla. Use as directed. Risk of skin atrophy with long-term use reviewed.   Topical steroids (such as triamcinolone, fluocinolone, fluocinonide, mometasone, clobetasol, halobetasol, betamethasone, hydrocortisone) can cause thinning and lightening of the skin if they are used for too long in the same area. Your physician has selected the right strength medicine for your problem and area affected on the body. Please use your medication only as directed by your physician to prevent side effects.   Recommend Serica moisturizing scar formula or [employer]'s silicone scar sheet.   Ordered Medications: triamcinolone (KENALOG) 0.1 %  Return if symptoms worsen or fail to improve.  Graciella Belton, RMA, am acting as scribe for Forest Gleason, MD .  Documentation: I have reviewed the above documentation for accuracy and completeness, and I agree with the above.  Forest Gleason, MD

## 2020-12-25 NOTE — Patient Instructions (Addendum)
Topical steroids (such as triamcinolone, fluocinolone, fluocinonide, mometasone, clobetasol, halobetasol, betamethasone, hydrocortisone) can cause thinning and lightening of the skin if they are used for too long in the same area. Your physician has selected the right strength medicine for your problem and area affected on the body. Please use your medication only as directed by your physician to prevent side effects.   Recommend Serica scar gel or [employer]'s scar sheet.   Eczema - Start TMC 0.1% cream twice daily to AA as needed for rash/itch for up to 2 weeks. Avoid applying to face, groin, and axilla. Use as directed. Risk of skin atrophy with long-term use reviewed.   Scar - Start TMC 0.1% cream to AA daily and cover with band aid or silicone scar sheet. Avoid applying to face, groin, and axilla. Use as directed. Risk of skin atrophy with long-term use reviewed.

## 2021-03-04 ENCOUNTER — Encounter: Payer: Self-pay | Admitting: Family Medicine

## 2021-03-05 ENCOUNTER — Telehealth (INDEPENDENT_AMBULATORY_CARE_PROVIDER_SITE_OTHER): Payer: Federal, State, Local not specified - PPO | Admitting: Family

## 2021-03-05 ENCOUNTER — Encounter: Payer: Self-pay | Admitting: Family

## 2021-03-05 DIAGNOSIS — U071 COVID-19: Secondary | ICD-10-CM

## 2021-03-05 NOTE — Assessment & Plan Note (Addendum)
She is well appearing, non toxic in appearance. Febrile. Cough without SOB. Offered referral for Paxlovid , she declines at this time. We discussed her symptoms overall mild however reasonable to consider therapy as she has not received booster. Cough is not particularly bothersome and she will let me know if she would like prescription for this.  Advised plain mucinex, tylenol or ibuprofen. Advised if any SOB she will need to report to ED immediately. She will let us know if any concerns.

## 2021-03-05 NOTE — Progress Notes (Signed)
Virtual Visit via Video Note  I connected with@  on 03/05/21 at 10:30 AM EDT by a video enabled telemedicine application and verified that I am speaking with the correct person using two identifiers.  Location patient: home Location provider:work  Persons participating in the virtual visit: patient, provider  I discussed the limitations of evaluation and management by telemedicine and the availability of in person appointments. The patient expressed understanding and agreed to proceed.   HPI: Covid positive with home test yesterday Endorses HA, fatigue, sore throat, otalgia which started yesterday which is unchanged  Productive cough started today. Fever yesterday tmax 100.30F.   Denies cp, sob, wheezing.  Has been taking tylenol with temporary relief   H/o HTN, alopecia . Currently on no treatment for alopecia.  H/o PE 2016- never knew etiology for this   She is vaccinated without booster.  She attended wedding over the weekend.She works from home.     ROS: See pertinent positives and negatives per HPI.    EXAM:  VITALS per patient if applicable: Ht 5\' 8"  (1.727 m)   BMI 43.49 kg/m  BP Readings from Last 3 Encounters:  12/05/20 130/70  08/30/20 118/80  11/29/19 120/80   Wt Readings from Last 3 Encounters:  12/05/20 286 lb (129.7 kg)  08/30/20 277 lb 3.2 oz (125.7 kg)  11/29/19 271 lb (122.9 kg)    GENERAL: alert, oriented, appears well and in no acute distress  HEENT: atraumatic, conjunttiva clear, no obvious abnormalities on inspection of external nose and ears  NECK: normal movements of the head and neck  LUNGS: on inspection no signs of respiratory distress, breathing rate appears normal, no obvious gross SOB, gasping or wheezing  CV: no obvious cyanosis  MS: moves all visible extremities without noticeable abnormality  PSYCH/NEURO: pleasant and cooperative, no obvious depression or anxiety, speech and thought processing grossly intact  ASSESSMENT AND  PLAN:  Discussed the following assessment and plan:  Problem List Items Addressed This Visit      Other   COVID-19    She is well appearing, non toxic in appearance. Febrile. Cough without SOB. Offered referral for Paxlovid , she declines at this time. We discussed her symptoms overall mild however reasonable to consider therapy as she has not received booster. Cough is not particularly bothersome and she will let me know if she would like prescription for this.  Advised plain mucinex, tylenol or ibuprofen. Advised if any SOB she will need to report to ED immediately. She will let us know if any concerns.          -we discussed possible serious and likely etiologies, options for evaluation and workup, limitations of telemedicine visit vs in person visit, treatment, treatment risks and precautions. Pt prefers to treat via telemedicine empirically rather then risking or undertaking an in person visit at this moment.  .   I discussed the assessment and treatment plan with the patient. The patient was provided an opportunity to ask questions and all were answered. The patient agreed with the plan and demonstrated an understanding of the instructions.   The patient was advised to call back or seek an in-person evaluation if the symptoms worsen or if the condition fails to improve as anticipated.   Mable Paris, FNP

## 2021-03-07 ENCOUNTER — Encounter: Payer: Self-pay | Admitting: Family

## 2021-03-07 ENCOUNTER — Other Ambulatory Visit: Payer: Self-pay | Admitting: Family

## 2021-03-07 DIAGNOSIS — U071 COVID-19: Secondary | ICD-10-CM

## 2021-03-07 MED ORDER — BENZONATATE 100 MG PO CAPS: 100.0000 mg | ORAL_CAPSULE | Freq: Three times a day (TID) | ORAL | 1 refills | Status: DC | PRN

## 2021-03-07 NOTE — Progress Notes (Signed)
close

## 2021-05-08 DIAGNOSIS — N130 Hydronephrosis with ureteropelvic junction obstruction: Secondary | ICD-10-CM | POA: Diagnosis not present

## 2021-05-08 DIAGNOSIS — N319 Neuromuscular dysfunction of bladder, unspecified: Secondary | ICD-10-CM | POA: Diagnosis not present

## 2021-07-22 ENCOUNTER — Ambulatory Visit (INDEPENDENT_AMBULATORY_CARE_PROVIDER_SITE_OTHER): Payer: Federal, State, Local not specified - PPO

## 2021-07-22 ENCOUNTER — Ambulatory Visit: Payer: Federal, State, Local not specified - PPO | Admitting: Podiatry

## 2021-07-22 ENCOUNTER — Other Ambulatory Visit: Payer: Self-pay

## 2021-07-22 ENCOUNTER — Encounter: Payer: Self-pay | Admitting: Podiatry

## 2021-07-22 ENCOUNTER — Other Ambulatory Visit: Payer: Self-pay | Admitting: Podiatry

## 2021-07-22 DIAGNOSIS — M5416 Radiculopathy, lumbar region: Secondary | ICD-10-CM | POA: Diagnosis not present

## 2021-07-22 DIAGNOSIS — M778 Other enthesopathies, not elsewhere classified: Secondary | ICD-10-CM

## 2021-07-22 DIAGNOSIS — M5431 Sciatica, right side: Secondary | ICD-10-CM

## 2021-07-22 MED ORDER — GABAPENTIN 100 MG PO CAPS: 100.0000 mg | ORAL_CAPSULE | Freq: Three times a day (TID) | ORAL | 0 refills | Status: DC

## 2021-07-22 MED ORDER — CYCLOBENZAPRINE HCL 10 MG PO TABS
10.0000 mg | ORAL_TABLET | Freq: Three times a day (TID) | ORAL | 0 refills | Status: DC | PRN
Start: 2021-07-22 — End: 2021-09-01

## 2021-07-22 MED ORDER — METHYLPREDNISOLONE 4 MG PO TBPK: ORAL_TABLET | ORAL | 0 refills | Status: DC

## 2021-07-22 NOTE — Progress Notes (Signed)
   HPI: 45 y.o. female presenting today as an established patient for a new complaint regarding pain and tenderness to the right lower extremity.  Patient states that she experiences foot pain across the dorsum of the foot into the toes that extends up her leg over her knee and into her groin.  Aggravated by certain positions such as sitting and laying down.  She has no pain when walking.  Currently she has not done anything for treatment.  This is been ongoing for about 5-6 months now.  She presents for further treatment and evaluation  Past Medical History:  Diagnosis Date   Alopecia    Alopecia    Blood transfusion without reported diagnosis 1978   H/O blood clots    Hyperlipidemia    Hypertension    Neurogenic bladder    Neurogenic bladder    Pulmonary embolism ([place]) 08/2015   Urinary tract infection    Urine incontinence      Physical Exam: General: The patient is alert and oriented x3 in no acute distress.  Dermatology: Skin is warm, dry and supple bilateral lower extremities. Negative for open lesions or macerations.  Vascular: Palpable pedal pulses bilaterally. No edema or erythema noted. Capillary refill within normal limits.  Neurological: Epicritic and protective threshold grossly intact bilaterally.   Musculoskeletal Exam: No pedal deformities noted.  There is some slight pain and tenderness with palpation throughout the dorsum and medial aspect of the foot and ankle which radiates up to the leg beyond the knee into the groin  Radiographic Exam:  Normal osseous mineralization. Joint spaces preserved. No fracture/dislocation/boney destruction.  There does appear to be some diffuse degenerative changes noted with para-articular spurring especially through the midtarsal joint  Assessment: 1.  Suspect lumbar radiculopathy VS.  Sciatica RLE   Plan of Care:  1. Patient evaluated. X-Rays reviewed.  2.  Explained to the patient today that her symptoms appear to be nerve  related to the right lower extremity.  Suspicious for lumbar radiculopathy or sciatica. 3.  Recommend follow-up with neurology.  Recommend referral from PCP.   4.  Prescription for Medrol Dosepak 5.  Prescription for Flexeril 10 mg 3 times daily as needed 6.  Prescription for gabapentin 100 mg 3 times daily as needed 7.  Return to clinic as needed      Edrick Kins, DPM [facility]  Dr. Edrick Kins, DPM    [address], [place] [address]                Office [phone]  Fax [phone]

## 2021-07-23 ENCOUNTER — Encounter: Payer: Self-pay | Admitting: Family Medicine

## 2021-08-05 DIAGNOSIS — M5136 Other intervertebral disc degeneration, lumbar region: Secondary | ICD-10-CM | POA: Diagnosis not present

## 2021-08-05 DIAGNOSIS — M5416 Radiculopathy, lumbar region: Secondary | ICD-10-CM | POA: Diagnosis not present

## 2021-08-05 DIAGNOSIS — Q068 Other specified congenital malformations of spinal cord: Secondary | ICD-10-CM | POA: Diagnosis not present

## 2021-08-11 ENCOUNTER — Other Ambulatory Visit: Payer: Self-pay | Admitting: Neurosurgery

## 2021-08-11 DIAGNOSIS — M5416 Radiculopathy, lumbar region: Secondary | ICD-10-CM

## 2021-08-11 DIAGNOSIS — M5136 Other intervertebral disc degeneration, lumbar region: Secondary | ICD-10-CM

## 2021-08-11 DIAGNOSIS — Q068 Other specified congenital malformations of spinal cord: Secondary | ICD-10-CM

## 2021-08-23 ENCOUNTER — Ambulatory Visit
Admission: RE | Admit: 2021-08-23 | Discharge: 2021-08-23 | Disposition: A | Discharge status: Home / Self Care | Payer: Federal, State, Local not specified - PPO | Source: Ambulatory Visit | Attending: Neurosurgery | Admitting: Neurosurgery

## 2021-08-23 ENCOUNTER — Other Ambulatory Visit: Payer: Self-pay

## 2021-08-23 DIAGNOSIS — Q068 Other specified congenital malformations of spinal cord: Secondary | ICD-10-CM

## 2021-08-23 DIAGNOSIS — D171 Benign lipomatous neoplasm of skin and subcutaneous tissue of trunk: Secondary | ICD-10-CM | POA: Diagnosis not present

## 2021-08-23 DIAGNOSIS — M5116 Intervertebral disc disorders with radiculopathy, lumbar region: Secondary | ICD-10-CM | POA: Diagnosis not present

## 2021-08-23 DIAGNOSIS — M5416 Radiculopathy, lumbar region: Secondary | ICD-10-CM | POA: Diagnosis not present

## 2021-08-23 DIAGNOSIS — Z8739 Personal history of other diseases of the musculoskeletal system and connective tissue: Secondary | ICD-10-CM | POA: Diagnosis not present

## 2021-08-23 DIAGNOSIS — M5136 Other intervertebral disc degeneration, lumbar region: Secondary | ICD-10-CM

## 2021-08-23 DIAGNOSIS — M5114 Intervertebral disc disorders with radiculopathy, thoracic region: Secondary | ICD-10-CM | POA: Diagnosis not present

## 2021-08-23 DIAGNOSIS — M4726 Other spondylosis with radiculopathy, lumbar region: Secondary | ICD-10-CM | POA: Diagnosis not present

## 2021-08-23 DIAGNOSIS — Q7649 Other congenital malformations of spine, not associated with scoliosis: Secondary | ICD-10-CM | POA: Diagnosis not present

## 2021-08-23 DIAGNOSIS — M51369 Other intervertebral disc degeneration, lumbar region without mention of lumbar back pain or lower extremity pain: Secondary | ICD-10-CM

## 2021-09-01 ENCOUNTER — Other Ambulatory Visit: Payer: Self-pay

## 2021-09-01 ENCOUNTER — Ambulatory Visit (INDEPENDENT_AMBULATORY_CARE_PROVIDER_SITE_OTHER): Payer: Federal, State, Local not specified - PPO | Admitting: Family Medicine

## 2021-09-01 ENCOUNTER — Encounter: Payer: Self-pay | Admitting: Family Medicine

## 2021-09-01 VITALS — BP 110/80 | HR 76 | Temp 98.9°F | Ht 68.0 in | Wt 291.4 lb

## 2021-09-01 DIAGNOSIS — Z23 Encounter for immunization: Secondary | ICD-10-CM | POA: Diagnosis not present

## 2021-09-01 DIAGNOSIS — Z0001 Encounter for general adult medical examination with abnormal findings: Secondary | ICD-10-CM

## 2021-09-01 DIAGNOSIS — R200 Anesthesia of skin: Secondary | ICD-10-CM

## 2021-09-01 DIAGNOSIS — Z13220 Encounter for screening for lipoid disorders: Secondary | ICD-10-CM

## 2021-09-01 DIAGNOSIS — R202 Paresthesia of skin: Secondary | ICD-10-CM

## 2021-09-01 LAB — COMPREHENSIVE METABOLIC PANEL
ALT: 23 U/L (ref 0–35)
AST: 20 U/L (ref 0–37)
Albumin: 4.2 g/dL (ref 3.5–5.2)
Alkaline Phosphatase: 98 U/L (ref 39–117)
BUN: 11 mg/dL (ref 6–23)
CO2: 26 mEq/L (ref 19–32)
Calcium: 8.8 mg/dL (ref 8.4–10.5)
Chloride: 105 mEq/L (ref 96–112)
Creatinine, Ser: 0.88 mg/dL (ref 0.40–1.20)
GFR: 80 mL/min (ref >60.00)
Glucose, Bld: 87 mg/dL (ref 70–99)
Potassium: 4.4 mEq/L (ref 3.5–5.1)
Sodium: 139 mEq/L (ref 135–145)
Total Bilirubin: 0.4 mg/dL (ref 0.2–1.2)
Total Protein: 6.6 g/dL (ref 6.0–8.3)

## 2021-09-01 LAB — LIPID PANEL
Cholesterol: 205 mg/dL — ABNORMAL HIGH (ref 0–200)
HDL: 49.7 mg/dL (ref >39.00)
LDL Cholesterol: 135 mg/dL — ABNORMAL HIGH (ref 0–99)
NonHDL: 154.88
Total CHOL/HDL Ratio: 4
Triglycerides: 100 mg/dL (ref 0.0–149.0)
VLDL: 20 mg/dL (ref 0.0–40.0)

## 2021-09-01 LAB — HEMOGLOBIN A1C: Hgb A1c MFr Bld: 5.6 % (ref 4.6–6.5)

## 2021-09-01 MED ORDER — SEMAGLUTIDE-WEIGHT MANAGEMENT 0.25 MG/0.5ML ~~LOC~~ SOAJ: 0.2500 mg | SUBCUTANEOUS | 0 refills | Status: DC

## 2021-09-01 MED ORDER — SEMAGLUTIDE-WEIGHT MANAGEMENT 1 MG/0.5ML ~~LOC~~ SOAJ: 1.0000 mg | SUBCUTANEOUS | 0 refills | Status: DC

## 2021-09-01 MED ORDER — SEMAGLUTIDE-WEIGHT MANAGEMENT 2.4 MG/0.75ML ~~LOC~~ SOAJ: 2.4000 mg | SUBCUTANEOUS | 1 refills | Status: DC

## 2021-09-01 MED ORDER — SEMAGLUTIDE-WEIGHT MANAGEMENT 0.5 MG/0.5ML ~~LOC~~ SOAJ: 0.5000 mg | SUBCUTANEOUS | 0 refills | Status: DC

## 2021-09-01 MED ORDER — SEMAGLUTIDE-WEIGHT MANAGEMENT 1.7 MG/0.75ML ~~LOC~~ SOAJ: 1.7000 mg | SUBCUTANEOUS | 0 refills | Status: DC

## 2021-09-01 NOTE — Assessment & Plan Note (Signed)
Patient has a very healthy diet and exercise routine.  We discussed medication management for her weight.  She notes no personal or family history of thyroid cancer, parathyroid cancer, or adrenal gland cancer.  She is status postcholecystectomy.  No history of pancreatitis.  We will trial wegovy if her insurance will cover this.  Discussed the risk of pancreatitis as well as nausea.  Advised that there is a small risk of a specific type of thyroid cancer that was only seen in rats studies.  Discussed that this does not seem to be an issue in humans though if she notices any thyroid issues she will let us know.

## 2021-09-01 NOTE — Progress Notes (Signed)
[name], MD Phone: [phone]  Bridget Gould is a 45 y.o. female who presents today for CPE.  Diet: healthy, lean meats, plenty of vegetables, not much junk or soda Exercise: 4-5x/week beach body Pap smear: 08/30/17 NILM negative HPV Colonoscopy: not indicated Mammogram: UTD Family history-  Colon cancer: no first degree relatives, maternal grandfather  Breast cancer: maternal aunt  Ovarian cancer: no Menses: 1x/month, 6 days Vaccines-   Flu: given today  Tetanus: UTD  COVID19: due for booster  Pneumonia: not indicated HIV screening: UTD Hep C Screening: UTD Tobacco use: no Alcohol use: 1/week Illicit Drug use: no Dentist: yes Ophthalmology: yes  Right foot and leg tingling and numbness: Patient thought this was a foot issue.  She saw her podiatrist who said it was not.  She ended up seeing neurosurgery and she had an MRI thoracic and lumbar spine that did not reveal any significant changes.  She describes it as a numbness and tingling as well as a pressure-like sensation in her foot on the top.  She does have some numbness and tingling of the anterior portion of her leg to her groin.  Notes its worse when sitting and lying.  She notes its intermittent.  Neurosurgery has referred her to neurology for further evaluation.   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[place]) 10/28/2017   History of appendectomy 10/28/2018   History of cholecystectomy 10/28/2018   Nevus 08/30/2020   COVID-19 03/05/2021   Acute pulmonary embolism ([place]) 08/21/2015   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   Numbness and tingling 12/15/2017   Resolved Ambulatory Problems    Diagnosis Date Noted   Acute bronchitis 09/02/2015   Encounter for general adult medical examination with abnormal findings 04/23/2016   Past Medical History:  Diagnosis Date   Blood transfusion without reported diagnosis 1978   H/O blood clots    Hyperlipidemia    Hypertension    Pulmonary embolism ([place]) 08/2015   Urinary tract infection    Urine incontinence     Family History  Problem Relation Age of Onset   Hyperlipidemia Mother    Arthritis Father    Hyperlipidemia Father    Heart disease Father    Hypertension Father    Cancer Maternal Grandfather        Colon/Prostate Cancer   Diabetes Paternal Grandfather    Alcohol abuse Sister    Drug abuse Sister    Mental illness Sister        BiPolar   Cancer Maternal Aunt        Breast Cancer   Breast cancer Maternal Aunt 68    Social History   Socioeconomic History   Marital status: Married    Spouse name: Not on file   Number of children: Not on file   Years of education: Not on file   Highest education level: Not on file  Occupational History   Not on file  Tobacco Use   Smoking status: Never   Smokeless tobacco: Never  Vaping Use   Vaping Use: Never used  Substance and Sexual  Activity   Alcohol use: Yes    Alcohol/week: 0.0 standard drinks    Comment: 1 drink every other week    Drug use: No   Sexual activity: Yes    Partners: Male    Birth control/protection: Pill    Comment: Husband   Other Topics Concern   Not on file  Social History Narrative   Married   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    No children   5 dogs   Caffeine- Coffee 1 cup daily, tea- depends, soda every other day   Social Determinants of Health   Financial Resource Strain: Not on file  Food Insecurity: Not on file  Transportation Needs: Not on file  Physical Activity:  Not on file  Stress: Not on file  Social Connections: Not on file  Intimate Partner Violence: Not on file    ROS  General:  Negative for nexplained weight loss, fever Skin: Negative for new or changing mole, sore that won't heal HEENT: Negative for trouble hearing, trouble seeing, ringing in ears, mouth sores, hoarseness, change in voice, dysphagia. CV:  Negative for chest pain, dyspnea, edema, palpitations Resp: Negative for cough, dyspnea, hemoptysis GI: Negative for nausea, vomiting, diarrhea, constipation, abdominal pain, melena, hematochezia. GU: Positive for urine leakage and frequent urination (chronic), negative for dysuria, urinary hesitance, hematuria, vaginal or penile discharge, sexual difficulty, lumps in testicle or breasts MSK: Positive for muscle cramps or aches, negative for joint pain or swelling Neuro: Positive for numbness, negative for headaches, weakness, dizziness, passing out/fainting Psych: Positive for anxiety (chronic), negative for depression, memory problems  Objective  Physical Exam Vitals:   09/01/21 0827 09/01/21 0906  BP: 140/80 110/80  Pulse: 76   Temp: 98.9 F (37.2 C)   SpO2: 99%     BP Readings from Last 3 Encounters:  09/01/21 110/80  12/05/20 130/70  08/30/20 118/80   Wt Readings from Last 3 Encounters:  09/01/21 291 lb 6.4 oz (132.2 kg)  12/05/20 286 lb (129.7 kg)  08/30/20 277 lb 3.2 oz (125.7 kg)    Physical Exam Constitutional:      General: She is not in acute distress.    Appearance: She is not diaphoretic.  HENT:     Head: Normocephalic and atraumatic.  Eyes:     Conjunctiva/sclera: Conjunctivae normal.     Pupils: Pupils are equal, round, and reactive to light.  Cardiovascular:     Rate and Rhythm: Normal rate and regular rhythm.     Heart sounds: Normal heart sounds.  Pulmonary:     Effort: Pulmonary effort is normal.     Breath sounds: Normal breath sounds.  Abdominal:     General: Bowel sounds are normal.  There is no distension.     Palpations: Abdomen is soft.     Tenderness: There is no abdominal tenderness.  Musculoskeletal:     Right lower leg: No edema.     Left lower leg: No edema.  Lymphadenopathy:     Cervical: No cervical adenopathy.  Skin:    General: Skin is warm and dry.  Neurological:     Mental Status: She is alert.  Psychiatric:     Comments: Notes anxiety that is stable     Assessment/Plan:   Problem List Items Addressed This Visit     Encounter for general adult medical examination with abnormal findings - Primary    Physical exam completed.  Encouraged continued  healthy diet and exercise.  She will have her mammogram early next year.  She will follow-up with GYN for her Pap smear and pelvic exam.  Discussed colon cancer screening starting at age 67 given her lack of a first-degree relative with colon cancers.  I encouraged her to get her COVID booster.  Lab work as outlined.      Morbid obesity ([place])    Patient has a very healthy diet and exercise routine.  We discussed medication management for her weight.  She notes no personal or family history of thyroid cancer, parathyroid cancer, or adrenal gland cancer.  She is status postcholecystectomy.  No history of pancreatitis.  We will trial wegovy if her insurance will cover this.  Discussed the risk of pancreatitis as well as nausea.  Advised that there is a small risk of a specific type of thyroid cancer that was only seen in rats studies.  Discussed that this does not seem to be an issue in humans though if she notices any thyroid issues she will let us know.      Relevant Medications   Semaglutide-Weight Management 0.25 MG/0.5ML SOAJ   Semaglutide-Weight Management 0.5 MG/0.5ML SOAJ (Start on 09/30/2021)   Semaglutide-Weight Management 1 MG/0.5ML SOAJ (Start on 10/29/2021)   Semaglutide-Weight Management 1.7 MG/0.75ML SOAJ (Start on 11/27/2021)   Semaglutide-Weight Management 2.4 MG/0.75ML SOAJ (Start on 12/26/2021)    Other Relevant Orders   HgB A1c   Numbness and tingling    Undetermined cause of numbness and tingling in her right foot and leg.  She had imaging of her thoracic and lumbar spines that was unrevealing.  Discussion needs to see the neurologist to have nerve conduction studies to help locate the cause of her symptoms.  She will contact neurosurgery if she has not heard from the neurologist within the next 2 weeks.      Other Visit Diagnoses     Need for immunization against influenza       Relevant Orders   Flu Vaccine QUAD 64moIM (Fluarix, Fluzone & Alfiuria Quad PF) (Completed)   Lipid screening       Relevant Orders   Comp Met (CMET)   Lipid panel       Return in about 3 months (around 12/02/2021) for weight.  This visit occurred during the SARS-CoV-2 public health emergency.  Safety protocols were in place, including screening questions prior to the visit, additional usage of staff PPE, and extensive cleaning of exam room while observing appropriate contact time as indicated for disinfecting solutions.    ETommi Rumps MD [place]

## 2021-09-01 NOTE — Assessment & Plan Note (Signed)
Physical exam completed.  Encouraged continued healthy diet and exercise.  She will have her mammogram early next year.  She will follow-up with GYN for her Pap smear and pelvic exam.  Discussed colon cancer screening starting at age 44 given her lack of a first-degree relative with colon cancers.  I encouraged her to get her COVID booster.  Lab work as outlined.

## 2021-09-01 NOTE — Patient Instructions (Signed)
Nice to see you. Please try the wegovy to help with weight loss. If you develop any abdominal pain or nausea please let us know.  If you notice any issues in your thyroid are please let us know. Please continue to work on diet and exercise.  We will call with you lab results. Please get the COVID booster.

## 2021-09-01 NOTE — Assessment & Plan Note (Signed)
Undetermined cause of numbness and tingling in her right foot and leg.  She had imaging of her thoracic and lumbar spines that was unrevealing.  Discussion needs to see the neurologist to have nerve conduction studies to help locate the cause of her symptoms.  She will contact neurosurgery if she has not heard from the neurologist within the next 2 weeks.

## 2021-09-02 ENCOUNTER — Telehealth: Payer: Self-pay | Admitting: *Deleted

## 2021-09-02 ENCOUNTER — Other Ambulatory Visit: Payer: Self-pay | Admitting: Podiatry

## 2021-09-02 ENCOUNTER — Encounter: Payer: Self-pay | Admitting: Family Medicine

## 2021-09-02 NOTE — Telephone Encounter (Signed)
Patient stated she requested it by mistake.  She said she does not need it.

## 2021-09-02 NOTE — Telephone Encounter (Signed)
"  Please disregard the request for the refill.  I accidentally hit the request button while I was on the [facility] app.  Please disregard it."

## 2021-09-02 NOTE — Telephone Encounter (Signed)
Please advise,patient's request per pharmacy

## 2021-09-09 ENCOUNTER — Telehealth: Payer: Self-pay

## 2021-09-09 NOTE — Chronic Care Management (AMB) (Signed)
  Care Management   Outreach Note  09/09/2021 Name: Bridget Gould MRN: [id number] DOB: Oct 29, 1976  Referred by: Leone Haven, MD Reason for referral : Care Coordination (Outreach to Schedule referral with Pharm D )   An unsuccessful telephone outreach was attempted today. The patient was referred to the case management team for assistance with care management and care coordination.   Follow Up Plan:  A HIPAA compliant phone message was left for the patient providing contact information and requesting a return call.  The care management team will reach out to the patient again over the next 7 days.  If patient returns call to provider office, please advise to call [facility]  at [place], [address], [place], [place] [address] Direct Dial: [phone] [email or web address] Website: [email or web address]

## 2021-09-15 NOTE — Chronic Care Management (AMB) (Signed)
  Care Management   Note  09/15/2021 Name: Bridget Gould MRN: [id number] DOB: [birth year]  [name] is a 45 y.o. year old female who is a primary care patient of Caryl Bis, Angela Adam, MD. I reached out to [employer] by phone today in response to a referral sent by Ms. Azora L Garin's primary care provider.   Ms. Ow was given information about care management services today including:  Care management services include personalized support from designated clinical staff supervised by her physician, including individualized plan of care and coordination with other care providers 24/7 contact phone numbers for assistance for urgent and routine care needs. The patient may stop care management services at any time by phone call to the office staff.  Patient agreed to services and verbal consent obtained.   Follow up plan: Telephone appointment with care management team member scheduled for:09/16/2021  Noreene Larsson, [place], [place], [place] [address] Direct Dial: [phone] [email or web address]: [email or web address]

## 2021-09-16 ENCOUNTER — Ambulatory Visit: Payer: Federal, State, Local not specified - PPO | Admitting: Pharmacist

## 2021-09-16 DIAGNOSIS — I10 Essential (primary) hypertension: Secondary | ICD-10-CM

## 2021-09-16 DIAGNOSIS — E785 Hyperlipidemia, unspecified: Secondary | ICD-10-CM

## 2021-09-16 MED ORDER — OZEMPIC (0.25 OR 0.5 MG/DOSE) 2 MG/1.5ML ~~LOC~~ SOPN: 0.5000 mg | PEN_INJECTOR | SUBCUTANEOUS | 2 refills | Status: DC

## 2021-09-16 NOTE — Chronic Care Management (AMB) (Signed)
Chronic Care Management CCM Pharmacy Note  09/16/2021 Name:  EYLEEN RAWLINSON MRN:  [id number] DOB:  [birth year]  Summary: - Unable to achieve goal weight  Recommendations/Changes made from today's visit: - Start Ozempic 0.25 mg weekly for 4 weeks then increase to 0.5 mg weekly  Subjective: Nalah L Coddington is an 45 y.o. year old female who is a primary patient of Sonnenberg, Angela Adam, MD.  The CCM team was consulted for assistance with disease management and care coordination needs.    Engaged with patient by telephone for initial visit for pharmacy case management and/or care coordination services.   Objective:  Medications Reviewed Today     Reviewed by Gordy Councilman, CMA (Certified Medical Assistant) on 09/01/21 at [phone]  Med List Status: <None>   Medication Order Taking? Sig Documenting Provider Last Dose Status Informant    Discontinued 09/01/21 0828 (Patient Preference)     Discontinued 09/01/21 0828 (Patient Preference)     Discontinued 09/01/21 0828 (Patient Preference)     Discontinued 09/01/21 5621 (Patient Preference)   norethindrone (MICRONOR) 0.35 MG tablet [id number] Yes Take 1 tablet (0.35 mg total) by mouth daily. Copland, Deirdre Evener, PA-C Taking Active   sulfamethoxazole-trimethoprim (BACTRIM,SEPTRA) 400-80 MG tablet [id number] Yes Take 0.5 tablets by mouth daily. [provider] Taking Active            Med Note Loney Laurence   Tue Nov 19, 2015  3:46 PM) Received from: External Pharmacy Received Sig: TAKE 0.5 TABLETS BY MOUTH DAILY.    Discontinued 09/01/21 0828 (Patient Preference)   venlafaxine XR (EFFEXOR-XR) 150 MG 24 hr capsule [id number] Yes TAKE 1 CAPSULE BY MOUTH EVERY DAY WITH BREAKFAST Copland, Deirdre Evener, PA-C Taking Active             Pertinent Labs:   Lab Results  Component Value Date   HGBA1C 5.6 09/01/2021   Lab Results  Component Value Date   CHOL 205 (H) 09/01/2021   HDL 49.70 09/01/2021   LDLCALC 135 (H) 09/01/2021    TRIG 100.0 09/01/2021   CHOLHDL 4 09/01/2021   Lab Results  Component Value Date   CREATININE 0.88 09/01/2021   BUN 11 09/01/2021   NA 139 09/01/2021   K 4.4 09/01/2021   CL 105 09/01/2021   CO2 26 09/01/2021    SDOH:  (Social Determinants of Health) assessments and interventions performed:  SDOH Interventions    Flowsheet Row Most Recent Value  SDOH Interventions   Financial Strain Interventions Intervention Not Indicated       CCM Care Plan  Review of patient past medical history, allergies, medications, health status, including review of consultants reports, laboratory and other test data, was performed as part of comprehensive evaluation and provision of chronic care management services.   Care Plan : Medication Management  Updates made by De Hollingshead, RPH-CPP since 09/16/2021 12:00 AM     Problem: Obesity      Long-Range Goal: Disease Progression Prevention   Start Date: 09/16/2021  This Visit's Progress: On track  Priority: High  Note:   Current Barriers:  Unable to independently afford treatment regimen Unable to achieve control of weight   Pharmacist Clinical Goal(s):  patient will achieve improvement in weightthrough collaboration with PharmD and provider.    Interventions: 1:1 collaboration with Leone Haven, MD regarding development and update of comprehensive plan of care as evidenced by provider attestation and co-signature Inter-disciplinary care team collaboration (see longitudinal plan of care) Comprehensive medication  review performed; medication list updated in electronic medical record  Overweight/Obesity Complicated by HLD, hx HTN:   Unable to achieve goal weight loss through lifestyle modification alone; current treatment: none;  Medications/Strategies previously tried: never tried medications before - Saxenda and Mancel Parsons were too expensive on insurance, even though it appeared insurance "covered" the medication  Baseline  weight: 291 lbs;  Current meal patterns: gallon of water daily; mediterranean diet/heart healthy diet - lean proteins, vegetables, whole grains Current exercise: working out 5-6 times weekly Extensive dietary counseling including education on focus on lean proteins, fruits and vegetables, whole grains and increased fiber consumption, adequate hydration Extensive exercise counseling including eventual goal of 150 minutes of moderate intensity exercise weekly Patient unable to achieve goal weight loss through diet and exercise alone. Appears her plan does not cover GLP1 agents for weight loss. Discussed use of Ozempic off label and that insurance could have concerns about use in nondiabetic patient. Patient verbalizes understanding and is interesting in seeing coverage. Script sent for Ozempic to her pharmacy. Prescription covered for $25. Start Ozempic 0.25 mg weekly for 4 weeks then increase to 0.5 mg weekly. Patient verbalizes understanding.    Patient Goals/Self-Care Activities patient will:  - take medications as prescribed as evidenced by patient report and record review collaborate with provider on medication access solutions target a minimum of 150 minutes of moderate intensity exercise weekly engage in dietary modifications by continuing to moderate portion sizes       Plan: Telephone follow up appointment with care management team member scheduled for:  5 weeks  Catie Darnelle Maffucci, PharmD, [place], [place] Pharmacist [employer] at [employer] [phone]

## 2021-09-16 NOTE — Patient Instructions (Signed)
Bridget Gould,   Start Ozempic 0.25 mg weekly for 4 weeks, then increase to 0.5 mg weekly. This medication may cause stomach upset, queasiness, or constipation, especially when first starting. This generally improves over time. Call our office if these symptoms occur and worsen, or if you have severe symptoms such as vomiting, diarrhea, or stomach pain.   Continue the great work on your dietary choices and physical activity.   Take care!    Visit Information  Patient Goals/Self-Care Activities patient will:  - take medications as prescribed as evidenced by patient report and record review collaborate with provider on medication access solutions target a minimum of 150 minutes of moderate intensity exercise weekly engage in dietary modifications by continuing to moderate portion sizes          Plan: Telephone follow up appointment with care management team member scheduled for:  5 weeks   Catie Darnelle Maffucci, PharmD, [place], CPP Clinical Pharmacist [place] at [facility] [phone]     Patient verbalizes understanding of instructions provided today and agrees to view in [place].

## 2021-10-16 ENCOUNTER — Telehealth: Payer: Self-pay | Admitting: Pharmacist

## 2021-10-16 ENCOUNTER — Other Ambulatory Visit (HOSPITAL_COMMUNITY): Payer: Self-pay

## 2021-10-16 ENCOUNTER — Encounter: Payer: Self-pay | Admitting: Family Medicine

## 2021-10-16 MED ORDER — OZEMPIC (0.25 OR 0.5 MG/DOSE) 2 MG/1.5ML ~~LOC~~ SOPN
0.5000 mg | PEN_INJECTOR | SUBCUTANEOUS | 2 refills | Status: DC
  Filled 2021-10-16: qty 1.5, 28d supply, fill #0
  Filled 2021-11-25: qty 1.5, 28d supply, fill #1

## 2021-10-16 NOTE — Telephone Encounter (Signed)
See MyChart message. Ozempic script sent to [facility]

## 2021-10-21 ENCOUNTER — Telehealth: Payer: Federal, State, Local not specified - PPO

## 2021-10-21 ENCOUNTER — Other Ambulatory Visit (HOSPITAL_COMMUNITY): Payer: Self-pay

## 2021-11-07 ENCOUNTER — Other Ambulatory Visit: Payer: Self-pay | Admitting: Obstetrics and Gynecology

## 2021-11-07 DIAGNOSIS — Z1231 Encounter for screening mammogram for malignant neoplasm of breast: Secondary | ICD-10-CM

## 2021-11-13 DIAGNOSIS — G629 Polyneuropathy, unspecified: Secondary | ICD-10-CM | POA: Diagnosis not present

## 2021-11-13 DIAGNOSIS — Q068 Other specified congenital malformations of spinal cord: Secondary | ICD-10-CM | POA: Diagnosis not present

## 2021-11-13 DIAGNOSIS — R200 Anesthesia of skin: Secondary | ICD-10-CM | POA: Diagnosis not present

## 2021-11-13 DIAGNOSIS — M79671 Pain in right foot: Secondary | ICD-10-CM | POA: Diagnosis not present

## 2021-11-25 ENCOUNTER — Telehealth: Payer: Self-pay | Admitting: Pharmacist

## 2021-11-25 ENCOUNTER — Other Ambulatory Visit: Payer: Self-pay

## 2021-11-25 ENCOUNTER — Encounter: Payer: Self-pay | Admitting: Family Medicine

## 2021-11-25 ENCOUNTER — Other Ambulatory Visit (HOSPITAL_COMMUNITY): Payer: Self-pay

## 2021-11-25 MED ORDER — OZEMPIC (0.25 OR 0.5 MG/DOSE) 2 MG/1.5ML ~~LOC~~ SOPN: 0.5000 mg | PEN_INJECTOR | SUBCUTANEOUS | 2 refills | Status: DC

## 2021-11-25 NOTE — Telephone Encounter (Signed)
See MyChart message. Refill sent on Ozempic by CMA. New prescription for pregabalin added to patient's chart.

## 2021-12-02 ENCOUNTER — Ambulatory Visit: Payer: Federal, State, Local not specified - PPO | Admitting: Pharmacist

## 2021-12-02 DIAGNOSIS — E785 Hyperlipidemia, unspecified: Secondary | ICD-10-CM

## 2021-12-02 NOTE — Progress Notes (Signed)
° °   ° °  Chief Complaint  Patient presents with   Care Coordination    Follow Up    Bridget Gould is a 46 y.o. year old female who was referred for medication management by their primary care provider, Caryl Bis, Angela Adam, MD. They presented for a telephone visit in the context of the COVID-19 pandemic.    Subjective: Obesity/Overweight, Complicated by HLD, HTN:  Current medications: Ozempic 0.5 mg weekly x 2 weeks  Weight Management treatments previously prescribed:  never tried medications before - Saxenda and Wegovy were too expensive on insurance, even though it appeared insurance "covered" the medication in prior years  Current meal patterns: gallon of water daily; mediterranean diet/heart healthy diet - lean proteins, vegetables, whole grains; husband recently had MI, so they are eating heart healthy  Current physical activity: working out 5-6 times weekly    Objective: Lab Results  Component Value Date   HGBA1C 5.6 09/01/2021    Lab Results  Component Value Date   CREATININE 0.88 09/01/2021   BUN 11 09/01/2021   NA 139 09/01/2021   K 4.4 09/01/2021   CL 105 09/01/2021   CO2 26 09/01/2021    Lab Results  Component Value Date   CHOL 205 (H) 09/01/2021   HDL 49.70 09/01/2021   LDLCALC 135 (H) 09/01/2021   TRIG 100.0 09/01/2021   CHOLHDL 4 09/01/2021    Medications Reviewed Today     Reviewed by De Hollingshead, RPH-CPP (Pharmacist) on 12/02/21 at 1526  Med List Status: <None>   Medication Order Taking? Sig Documenting Provider Last Dose Status Informant  norethindrone (MICRONOR) 0.35 MG tablet [id number] Yes Take 1 tablet (0.35 mg total) by mouth daily. Copland, Deirdre Evener, PA-C Taking Active   pregabalin (LYRICA) 25 MG capsule [id number] Yes Take 25 mg by mouth daily. [provider] Taking Active   Semaglutide,0.25 or 0.5MG /DOS, (OZEMPIC, 0.25 OR 0.5 MG/DOSE,) 2 MG/1.5ML SOPN [id number] Yes Inject 0.5 mg into the skin once a week. Leone Haven, MD Taking Active   sulfamethoxazole-trimethoprim (BACTRIM,SEPTRA) 400-80 MG tablet [id number] Yes Take 0.5 tablets by mouth daily. [provider] Taking Active            Med Note Lula Olszewski Dec 02, 2021  3:26 PM)    venlafaxine XR (EFFEXOR-XR) 150 MG 24 hr capsule [id number] Yes TAKE 1 CAPSULE BY MOUTH EVERY DAY WITH BREAKFAST Copland, Deirdre Evener, PA-C Taking Active             Assessment/Plan:   Obesity/Overweight: - Currently unable to achieve goal weight loss of 5-10% through diet and lifestyle modifications alone - Discussed insurance changes in 2023 that have resulted in most plans not covering Ozempic for patients without a diagnosis of T2DM. She does note that she has been able to fill Ozempic at the pharmacy in 2023.  - Given new calendar year, will complete PA for [facility] to see if coverage is better, as we cannot guarantee her plan will continue to cover Ozempic off label moving forward. She is amenable. Continue Ozempic 0.5 mg weekly for now. Follow up with PCP in 2 weeks. That should be time for PA and/or appeal on insurance to be processed for [facility].   Follow Up Plan: pending medication access  Catie Darnelle Maffucci, PharmD, [place], [place] Clinical Pharmacist [employer] at [facility] [phone]

## 2021-12-02 NOTE — Patient Instructions (Signed)
[place],   I will complete a Prior Authorization on your insurance for [phone]. I will let you know what I find out!  Catie Darnelle Maffucci, PharmD

## 2021-12-03 ENCOUNTER — Other Ambulatory Visit: Payer: Self-pay

## 2021-12-03 ENCOUNTER — Telehealth: Payer: Self-pay | Admitting: Pharmacist

## 2021-12-03 NOTE — Telephone Encounter (Signed)
Received notification from Cover My Meds that PA for Mancel Parsons was approved on patient's insurance plan. Test claim ran with [facility] pharmacy - $24.99. Patient may be able to also use savings card available on manufacturer website.   Called patient to notify. Left voicemail. Will send MyChart  Recommend sending for either Wegovy 0.5 mg (if patient desires to continue current dose) or increase to [facility] 1 mg when she sees PCP in ~ 2 weeks.

## 2021-12-04 NOTE — Progress Notes (Signed)
PCP:  Leone Haven, MD   Chief Complaint  Patient presents with   Gynecologic Exam    No concerns    HPI:      [name] is a 46 y.o. G0P0000 who LMP was Patient's last menstrual period was 11/21/2021 (exact date)., presents today for her annual examination.  Her menses are regular every 28-30 days, lasting 5 days.  Dysmenorrhea mild, no BTB. On POPs.  Sex activity: single partner, contraception - oral progesterone-only contraceptive. No pain/bleeding Last Pap: 08/30/17 Results were: no abnormalities /neg HPV DNA  Hx of STDs: none  Mammogram: 12/09/20  Results were normal, repeat in 12 months. Has appt 2/23 There is a FH of breast cancer in her mat aunt, who is BRCA/gene panel neg 2018. Pt doesn't qualify for cancer genetic testing due to age of dx of mat aunt. There is no FH of ovarian cancer. The patient does do self-breast exams.  Tobacco use: The patient denies current or previous tobacco use. Alcohol use: social drinker No drug use.  Exercise: very active  She does get adequate calcium and Vitamin D in her diet.  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 No side effects. Wants to continue Rx.  Labs with PCP. HTN  improved. Pt working from home and exercising more now.  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      Family History  Problem Relation Age of Onset   Hyperlipidemia Mother    Arthritis Father    Hyperlipidemia Father    Heart disease Father    Hypertension Father     Cancer Maternal Grandfather        Colon/Prostate Cancer   Diabetes Paternal Grandfather    Alcohol abuse Sister    Drug abuse Sister    Mental illness Sister        BiPolar   Cancer Maternal Aunt        Breast Cancer   Breast cancer Maternal Aunt 9    Social History   Socioeconomic History   Marital status: Married    Spouse name: Not on file   Number of children: Not on file   Years of education: Not on file   Highest education level: Not on file  Occupational History   Not on file  Tobacco Use   Smoking status: Never   Smokeless tobacco: Never  Vaping Use   Vaping Use: Never used  Substance and Sexual Activity   Alcohol use: Yes    Alcohol/week: 0.0 standard drinks    Comment: 1 drink every other week    Drug use: No   Sexual activity: Yes    Partners: Male    Birth control/protection: Pill    Comment: Husband   Other Topics Concern   Not on file  Social History Narrative   Married   Employed    4 years [employer]  Supervisor at [employer]    No children   5 dogs   Caffeine- Coffee 1 cup daily, tea- depends, soda every other day   Social Determinants of Health   Financial Resource Strain: Low Risk    Difficulty of Paying Living Expenses: Not hard at all  Food Insecurity: Not on file  Transportation Needs: Not on file  Physical Activity: Not on file  Stress: Not on file  Social Connections: Not on file  Intimate Partner Violence: Not on file    Current Meds  Medication Sig   pregabalin (LYRICA) 25 MG capsule Take 25 mg by mouth daily.   Semaglutide,0.25 or 0.5MG/DOS, (OZEMPIC, 0.25 OR 0.5 MG/DOSE,) 2 MG/1.5ML SOPN Inject 0.5 mg into the skin once a week.   sulfamethoxazole-trimethoprim (BACTRIM,SEPTRA) 400-80 MG tablet Take 0.5 tablets by mouth daily.   [DISCONTINUED] norethindrone (MICRONOR) 0.35 MG tablet Take 1 tablet (0.35 mg total) by mouth daily.   [DISCONTINUED] venlafaxine XR (EFFEXOR-XR) 150 MG 24 hr capsule  TAKE 1 CAPSULE BY MOUTH EVERY DAY WITH BREAKFAST     ROS:  Review of Systems  Constitutional:  Negative for fatigue, fever and unexpected weight change.  Respiratory:  Negative for cough, shortness of breath and wheezing.   Cardiovascular:  Negative for chest pain, palpitations and leg swelling.  Gastrointestinal:  Negative for blood in stool, constipation, diarrhea, nausea and vomiting.  Endocrine: Negative for cold intolerance, heat intolerance and polyuria.  Genitourinary:  Negative for dyspareunia, dysuria, flank pain, frequency, genital sores, hematuria, menstrual problem, pelvic pain, urgency, vaginal bleeding, vaginal discharge and vaginal pain.  Musculoskeletal:  Negative for back pain, joint swelling and myalgias.  Skin:  Negative for rash.  Neurological:  Negative for dizziness, syncope, light-headedness, numbness and headaches.  Hematological:  Negative for adenopathy.  Psychiatric/Behavioral:  Negative for agitation, confusion, sleep disturbance and suicidal ideas. The patient is not nervous/anxious.     Objective: BP 138/90    Ht 5' 8"  (1.727 m)    Wt 299 lb (135.6 kg)    LMP 11/21/2021 (Exact Date)    BMI 45.46 kg/m    Physical Exam Constitutional:      Appearance: She is well-developed.  Genitourinary:     Vulva normal.     Right Labia: No rash, tenderness or lesions.    Left Labia: No tenderness, lesions or rash.    No vaginal discharge, erythema or tenderness.      Right Adnexa: not tender and no mass present.    Left Adnexa: not tender and no mass present.    No cervical motion tenderness, friability or polyp.     Uterus is not enlarged or tender.  Breasts:    Right: No mass, nipple discharge, skin change or tenderness.     Left: No mass, nipple discharge, skin change or tenderness.  Neck:     Thyroid: No thyromegaly.  Cardiovascular:     Rate and Rhythm: Normal rate and regular rhythm.     Heart sounds: Normal heart sounds. No murmur heard. Pulmonary:      Effort: Pulmonary effort is normal.     Breath sounds: Normal breath sounds.  Abdominal:     Palpations: Abdomen is soft.     Tenderness: There is no abdominal tenderness. There is no guarding or rebound.  Musculoskeletal:        General: Normal range of motion.     Cervical back: Normal range of motion.  Lymphadenopathy:     Cervical: No cervical  adenopathy.  Neurological:     General: No focal deficit present.     Mental Status: She is alert and oriented to person, place, and time.     Cranial Nerves: No cranial nerve deficit.  Skin:    General: Skin is warm and dry.  Psychiatric:        Mood and Affect: Mood normal.        Behavior: Behavior normal.        Thought Content: Thought content normal.        Judgment: Judgment normal.  Vitals reviewed.    Assessment/Plan: Encounter for annual routine gynecological examination  Cervical cancer screening - Plan: Cytology - PAP  Screening for HPV (human papillomavirus) - Plan: Cytology - PAP  Encounter for screening mammogram for malignant neoplasm of breast; pt to has mammo sched  Encounter for surveillance of contraceptive pills - Plan: norethindrone (MICRONOR) 0.35 MG tablet; Rx RF  Anxiety - Plan: venlafaxine XR (EFFEXOR-XR) 150 MG 24 hr capsule; Rx RF. Doing well   Meds ordered this encounter  Medications   venlafaxine XR (EFFEXOR-XR) 150 MG 24 hr capsule    Sig: TAKE 1 CAPSULE BY MOUTH EVERY DAY WITH BREAKFAST    Dispense:  90 capsule    Refill:  3    Order Specific Question:   Supervising Provider    Answer:   Gae Dry [[id number]]   norethindrone (MICRONOR) 0.35 MG tablet    Sig: Take 1 tablet (0.35 mg total) by mouth daily.    Dispense:  84 tablet    Refill:  3    Order Specific Question:   Supervising Provider    Answer:   Gae Dry [[id number]]              GYN counsel mammography screening, adequate intake of calcium and vitamin D, diet and exercise     F/U  Return in about 1 year (around  12/08/2022).  Danyon Mcginness B. Ramsha Lonigro, PA-C 12/08/2021 9:32 AM

## 2021-12-08 ENCOUNTER — Encounter: Payer: Self-pay | Admitting: Obstetrics and Gynecology

## 2021-12-08 ENCOUNTER — Ambulatory Visit (INDEPENDENT_AMBULATORY_CARE_PROVIDER_SITE_OTHER): Payer: Federal, State, Local not specified - PPO | Admitting: Obstetrics and Gynecology

## 2021-12-08 ENCOUNTER — Other Ambulatory Visit: Payer: Self-pay

## 2021-12-08 ENCOUNTER — Other Ambulatory Visit: Payer: Self-pay | Admitting: Neurology

## 2021-12-08 ENCOUNTER — Other Ambulatory Visit (HOSPITAL_COMMUNITY)
Admission: RE | Admit: 2021-12-08 | Discharge: 2021-12-08 | Disposition: A | Discharge status: Home / Self Care | Payer: Federal, State, Local not specified - PPO | Source: Ambulatory Visit | Attending: Obstetrics and Gynecology | Admitting: Obstetrics and Gynecology

## 2021-12-08 VITALS — BP 138/90 | Ht 68.0 in | Wt 299.0 lb

## 2021-12-08 DIAGNOSIS — R200 Anesthesia of skin: Secondary | ICD-10-CM

## 2021-12-08 DIAGNOSIS — R202 Paresthesia of skin: Secondary | ICD-10-CM

## 2021-12-08 DIAGNOSIS — Z3041 Encounter for surveillance of contraceptive pills: Secondary | ICD-10-CM

## 2021-12-08 DIAGNOSIS — Z8669 Personal history of other diseases of the nervous system and sense organs: Secondary | ICD-10-CM

## 2021-12-08 DIAGNOSIS — Z01419 Encounter for gynecological examination (general) (routine) without abnormal findings: Secondary | ICD-10-CM

## 2021-12-08 DIAGNOSIS — Z1151 Encounter for screening for human papillomavirus (HPV): Secondary | ICD-10-CM | POA: Diagnosis not present

## 2021-12-08 DIAGNOSIS — Z1231 Encounter for screening mammogram for malignant neoplasm of breast: Secondary | ICD-10-CM

## 2021-12-08 DIAGNOSIS — Z124 Encounter for screening for malignant neoplasm of cervix: Secondary | ICD-10-CM

## 2021-12-08 DIAGNOSIS — F419 Anxiety disorder, unspecified: Secondary | ICD-10-CM

## 2021-12-08 MED ORDER — VENLAFAXINE HCL ER 150 MG PO CP24: ORAL_CAPSULE | ORAL | 3 refills | Status: DC

## 2021-12-08 MED ORDER — NORETHINDRONE 0.35 MG PO TABS: 1.0000 | ORAL_TABLET | Freq: Every day | ORAL | 3 refills | Status: DC

## 2021-12-08 NOTE — Patient Instructions (Signed)
I value your feedback and you entrusting us with your care. If you get a  patient survey, I would appreciate you taking the time to let us know about your experience today. Thank you! ? ? ?

## 2021-12-10 ENCOUNTER — Ambulatory Visit
Admission: RE | Admit: 2021-12-10 | Discharge: 2021-12-10 | Disposition: A | Discharge status: Home / Self Care | Payer: Federal, State, Local not specified - PPO | Source: Ambulatory Visit | Attending: Obstetrics and Gynecology | Admitting: Obstetrics and Gynecology

## 2021-12-10 ENCOUNTER — Other Ambulatory Visit: Payer: Self-pay

## 2021-12-10 DIAGNOSIS — Z1231 Encounter for screening mammogram for malignant neoplasm of breast: Secondary | ICD-10-CM | POA: Diagnosis not present

## 2021-12-11 LAB — CYTOLOGY - PAP
Comment: NEGATIVE
Diagnosis: NEGATIVE
Diagnosis: REACTIVE
High risk HPV: NEGATIVE

## 2021-12-17 ENCOUNTER — Other Ambulatory Visit (HOSPITAL_COMMUNITY): Payer: Self-pay

## 2021-12-17 ENCOUNTER — Other Ambulatory Visit: Payer: Self-pay

## 2021-12-17 ENCOUNTER — Encounter: Payer: Self-pay | Admitting: Family Medicine

## 2021-12-17 ENCOUNTER — Ambulatory Visit: Payer: Federal, State, Local not specified - PPO | Admitting: Family Medicine

## 2021-12-17 DIAGNOSIS — Q068 Other specified congenital malformations of spinal cord: Secondary | ICD-10-CM | POA: Diagnosis not present

## 2021-12-17 MED ORDER — SEMAGLUTIDE-WEIGHT MANAGEMENT 1 MG/0.5ML ~~LOC~~ SOAJ
1.0000 mg | SUBCUTANEOUS | 0 refills | Status: DC
Start: 2022-02-13 — End: 2022-01-22
  Filled 2021-12-17: qty 2, 28d supply, fill #0

## 2021-12-17 MED ORDER — SEMAGLUTIDE-WEIGHT MANAGEMENT 1.7 MG/0.75ML ~~LOC~~ SOAJ
1.7000 mg | SUBCUTANEOUS | 0 refills | Status: DC
Start: 2022-03-14 — End: 2022-01-28
  Filled 2021-12-17: qty 3, 28d supply, fill #0

## 2021-12-17 MED ORDER — SEMAGLUTIDE-WEIGHT MANAGEMENT 2.4 MG/0.75ML ~~LOC~~ SOAJ
2.4000 mg | SUBCUTANEOUS | 2 refills | Status: DC
Start: 2022-04-12 — End: 2022-01-28
  Filled 2021-12-17: qty 3, fill #0

## 2021-12-17 NOTE — Progress Notes (Signed)
[name], MD Phone: [phone]  Bridget Gould is a 46 y.o. female who presents today for follow-up.  Obesity: The patient has been on Ozempic 0.5 mg for the last 2 weeks.  She does report a reduced appetite on this medication.  No nausea or abdominal pain.  She exercises 5-6 times a week for 30 minutes.  She eats a heart healthy diet with lean meats and vegetables.  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  Notes the top of her foot feels as though it wants to pop and then her toes feel as though they are pointing up though they are not.  Then it radiates up her leg and into her groin.  Only occurs when she is sitting or lying down.  She reports she has an MRI and an EMG scheduled later this month.  Social History   Tobacco Use  Smoking Status Never  Smokeless Tobacco Never    Current Outpatient Medications on File Prior to Visit  Medication Sig Dispense Refill   norethindrone (MICRONOR) 0.35 MG tablet Take 1 tablet (0.35 mg total) by mouth daily. 84 tablet 3   sulfamethoxazole-trimethoprim (BACTRIM,SEPTRA) 400-80 MG tablet Take 0.5 tablets by mouth daily.  1   venlafaxine XR (EFFEXOR-XR) 150 MG 24 hr capsule TAKE 1 CAPSULE BY MOUTH EVERY DAY WITH BREAKFAST 90 capsule 3   pregabalin (LYRICA) 25 MG capsule Take 25 mg by mouth daily. (Patient not taking: Reported on 12/17/2021)     No current facility-administered medications on file prior to visit.     ROS see history of present illness  Objective  Physical Exam Vitals:   12/17/21 0934  BP: 120/80  Pulse: 78  Temp: 98.6 F (37 C)  SpO2: 99%    BP Readings from Last 3 Encounters:  12/17/21 120/80  12/08/21 138/90  09/01/21 110/80   Wt Readings from Last 3 Encounters:  12/17/21 294 lb 9.6 oz (133.6 kg)  12/08/21 299 lb (135.6 kg)  09/01/21 291 lb 6.4 oz (132.2 kg)    Physical Exam Constitutional:      General: She is not in acute distress.     Appearance: She is not diaphoretic.  Cardiovascular:     Rate and Rhythm: Normal rate and regular rhythm.     Heart sounds: Normal heart sounds.  Pulmonary:     Effort: Pulmonary effort is normal.     Breath sounds: Normal breath sounds.  Skin:    General: Skin is warm and dry.  Neurological:     Mental Status: She is alert.     Comments: 5/5 strength bilateral quads, hamstrings, plantarflexion, and dorsiflexion, sensation to light touch intact bilateral lower extremities     Assessment/Plan: Please see individual problem list.  Problem List Items Addressed This Visit     Morbid obesity ([place])    Weight has trended down some.  I encouraged continued diet and exercise.  We will switch her over to [facility] and start her ramp up to max dosing.  She will monitor for nausea.  We will follow-up in 6 weeks.      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  I advised  that she proceed with the MRI and EMG to help sort out what is going on to determine what the best method of treatment would be.       Return in about 6 weeks (around 01/28/2022) for Weight follow-up.  This visit occurred during the SARS-CoV-2 public health emergency.  Safety protocols were in place, including screening questions prior to the visit, additional usage of staff PPE, and extensive cleaning of exam room while observing appropriate contact time as indicated for disinfecting solutions.    [name], MD [place]

## 2021-12-17 NOTE — Assessment & Plan Note (Signed)
Weight has trended down some.  I encouraged continued diet and exercise.  We will switch her over to [facility] and start her ramp up to max dosing.  She will monitor for nausea.  We will follow-up in 6 weeks.

## 2021-12-17 NOTE — Assessment & Plan Note (Signed)
Patient may be having symptoms from her tethered spinal cord or a nerve impingement.  I advised that she proceed with the MRI and EMG to help sort out what is going on to determine what the best method of treatment would be.

## 2021-12-17 NOTE — Patient Instructions (Signed)
Nice to see you. We will proceed with switching to [facility] and increasing your dosage.  If you notice any nausea please let us know. If you notice any abdominal pain please discontinue the medication and contact us right away.  If the abdominal pain is severe please go to the emergency department.

## 2021-12-27 ENCOUNTER — Other Ambulatory Visit: Payer: Self-pay

## 2021-12-27 ENCOUNTER — Ambulatory Visit
Admission: RE | Admit: 2021-12-27 | Discharge: 2021-12-27 | Disposition: A | Discharge status: Home / Self Care | Payer: Federal, State, Local not specified - PPO | Source: Ambulatory Visit | Attending: Neurology | Admitting: Neurology

## 2021-12-27 DIAGNOSIS — R200 Anesthesia of skin: Secondary | ICD-10-CM | POA: Insufficient documentation

## 2021-12-27 DIAGNOSIS — R202 Paresthesia of skin: Secondary | ICD-10-CM | POA: Diagnosis not present

## 2021-12-27 DIAGNOSIS — Z8669 Personal history of other diseases of the nervous system and sense organs: Secondary | ICD-10-CM | POA: Diagnosis not present

## 2022-01-02 DIAGNOSIS — M47816 Spondylosis without myelopathy or radiculopathy, lumbar region: Secondary | ICD-10-CM | POA: Diagnosis not present

## 2022-01-06 ENCOUNTER — Ambulatory Visit: Payer: Self-pay | Admitting: Pharmacist

## 2022-01-06 NOTE — Chronic Care Management (AMB) (Signed)
?  Chronic Care Management  ? ?Note ? ?01/06/2022 ?Name: ADELIE CROSWELL MRN: [id number] DOB: 1976-10-15 ? ? ? ?Closing pharmacy CCM case at this time. Patient has clinic contact information for future questions or concerns.  ? ?Catie Darnelle Maffucci, PharmD, [place], CPP ?Clinical Pharmacist ?Therapist, music at [employer] ?[phone] ? ?

## 2022-01-12 ENCOUNTER — Telehealth: Payer: Self-pay

## 2022-01-12 NOTE — Telephone Encounter (Signed)
Patient got a approval for [facility] from 11/02/2021-05/31/2022.  Bridget Gould,cma  ?

## 2022-01-20 ENCOUNTER — Telehealth: Payer: Federal, State, Local not specified - PPO

## 2022-01-22 ENCOUNTER — Other Ambulatory Visit (HOSPITAL_COMMUNITY): Payer: Self-pay

## 2022-01-22 ENCOUNTER — Other Ambulatory Visit: Payer: Self-pay

## 2022-01-22 ENCOUNTER — Encounter: Payer: Self-pay | Admitting: Family Medicine

## 2022-01-22 MED ORDER — SEMAGLUTIDE-WEIGHT MANAGEMENT 1 MG/0.5ML ~~LOC~~ SOAJ
1.0000 mg | SUBCUTANEOUS | 0 refills | Status: DC
  Filled 2022-01-22: qty 2, 28d supply, fill #0

## 2022-01-28 ENCOUNTER — Encounter: Payer: Self-pay | Admitting: Family Medicine

## 2022-01-28 ENCOUNTER — Other Ambulatory Visit (HOSPITAL_COMMUNITY): Payer: Self-pay

## 2022-01-28 ENCOUNTER — Other Ambulatory Visit: Payer: Self-pay

## 2022-01-28 ENCOUNTER — Ambulatory Visit: Payer: Federal, State, Local not specified - PPO | Admitting: Family Medicine

## 2022-01-28 DIAGNOSIS — Z8679 Personal history of other diseases of the circulatory system: Secondary | ICD-10-CM | POA: Diagnosis not present

## 2022-01-28 MED ORDER — SEMAGLUTIDE-WEIGHT MANAGEMENT 1.7 MG/0.75ML ~~LOC~~ SOAJ
1.7000 mg | SUBCUTANEOUS | 0 refills | Status: DC
  Filled 2022-01-28: qty 3, 28d supply, fill #0

## 2022-01-28 MED ORDER — SEMAGLUTIDE-WEIGHT MANAGEMENT 2.4 MG/0.75ML ~~LOC~~ SOAJ
2.4000 mg | SUBCUTANEOUS | 2 refills | Status: DC
Start: 2022-04-12 — End: 2022-05-22
  Filled 2022-01-28: qty 3, fill #0
  Filled 2022-02-27: qty 3, 28d supply, fill #0
  Filled 2022-03-26: qty 3, 28d supply, fill #1
  Filled 2022-04-14 – 2022-04-24 (×2): qty 3, 28d supply, fill #2

## 2022-01-28 NOTE — Assessment & Plan Note (Addendum)
I encouraged continued healthy diet and exercise.  She will complete the additional 4-week course of the 1 mg dose of Wegovy given that she has that supply on hand.  She will then start on the 1.7 mg dose for 4 weeks and then after that start on the 2.4 mg dose ongoing.  These refills were sent to the pharmacy.  She will contact us immediately if she notices any nausea or abdominal pain. ?

## 2022-01-28 NOTE — Assessment & Plan Note (Signed)
Has been previously well controlled and off of medication.  BP elevated today though improved on recheck.  She will start checking at home.  She was informed of a goal of less than 130/80.  If it runs above that she will let us know. ?

## 2022-01-28 NOTE — Progress Notes (Signed)
?[name], MD ?Phone: [phone] ? ?Bridget Gould is a 46 y.o. female who presents today for follow-up. ? ?Obesity: Patient has been on Wegovy.  She is in her fourth week of the 1 mg dose.  The pharmacy refilled the 1 mg dose for her again so she has 4 more weeks of that.  She continues with healthy Mediterranean diet.  She continues 5 days a week of exercise.  She has noticed no nausea or abdominal pain with the [facility]. ? ?History of hypertension: Patient does have a history of hypertension.  She notes her job was texting her while she was in the waiting room and she could feel her blood pressure going up.  She does not check her blood pressure at home.  No chest pain or shortness of breath. ? ?Social History  ? ?Tobacco Use  ?Smoking Status Never  ?Smokeless Tobacco Never  ? ? ?Current Outpatient Medications on File Prior to Visit  ?Medication Sig Dispense Refill  ? norethindrone (MICRONOR) 0.35 MG tablet Take 1 tablet (0.35 mg total) by mouth daily. 84 tablet 3  ? sulfamethoxazole-trimethoprim (BACTRIM,SEPTRA) 400-80 MG tablet Take 0.5 tablets by mouth daily.  1  ? venlafaxine XR (EFFEXOR-XR) 150 MG 24 hr capsule TAKE 1 CAPSULE BY MOUTH EVERY DAY WITH BREAKFAST 90 capsule 3  ? ?No current facility-administered medications on file prior to visit.  ? ? ? ?ROS see history of present illness ? ?Objective ? ?Physical Exam ?Vitals:  ? 01/28/22 0817  ?BP: 140/90  ?Pulse: 89  ?Temp: 98.4 ?F (36.9 ?C)  ?SpO2: 98%  ? ? ?BP Readings from Last 3 Encounters:  ?01/28/22 140/90  ?12/17/21 120/80  ?12/08/21 138/90  ? ?Wt Readings from Last 3 Encounters:  ?01/28/22 290 lb 12.8 oz (131.9 kg)  ?12/17/21 294 lb 9.6 oz (133.6 kg)  ?12/08/21 299 lb (135.6 kg)  ? ? ?Physical Exam ?Constitutional:   ?   General: She is not in acute distress. ?   Appearance: She is not diaphoretic.  ?Cardiovascular:  ?   Rate and Rhythm: Normal rate and regular rhythm.  ?   Heart sounds: Normal heart sounds.  ?Pulmonary:  ?   Effort:  Pulmonary effort is normal.  ?   Breath sounds: Normal breath sounds.  ?Skin: ?   General: Skin is warm and dry.  ?Neurological:  ?   Mental Status: She is alert.  ? ? ? ?Assessment/Plan: Please see individual problem list. ? ?Problem List Items Addressed This Visit   ? ? History of hypertension  ?  Has been previously well controlled and off of medication.  BP elevated today though improved on recheck.  She will start checking at home.  She was informed of a goal of less than 130/80.  If it runs above that she will let us know. ?  ?  ? Morbid obesity ([place])  ?  I encouraged continued healthy diet and exercise.  She will complete the additional 4-week course of the 1 mg dose of Wegovy given that she has that supply on hand.  She will then start on the 1.7 mg dose for 4 weeks and then after that start on the 2.4 mg dose ongoing.  These refills were sent to the pharmacy.  She will contact us immediately if she notices any nausea or abdominal pain. ?  ?  ? Relevant Medications  ? Semaglutide-Weight Management 1.7 MG/0.75ML SOAJ (Start on 03/14/2022)  ? Semaglutide-Weight Management 2.4 MG/0.75ML SOAJ (Start on 04/12/2022)  ? ? ?  Return in about 3 months (around 04/30/2022) for Weight. ? ?This visit occurred during the SARS-CoV-2 public health emergency.  Safety protocols were in place, including screening questions prior to the visit, additional usage of staff PPE, and extensive cleaning of exam room while observing appropriate contact time as indicated for disinfecting solutions.  ? ? ?[name], MD ?[place] ? ?

## 2022-01-28 NOTE — Patient Instructions (Signed)
Nice to see you. ?Please complete the additional 4-week course of the 1 mg dose of Wegovy.  After that you will start on the 1.7 mg dose for 4 weeks.  After that you will move up to the 2.4 mg dose on an ongoing basis.  You can contact the pharmacy for your refill.  If you notice any nausea or abdominal pain please let us know immediately. ?

## 2022-02-02 ENCOUNTER — Encounter: Payer: Self-pay | Admitting: Family Medicine

## 2022-02-16 DIAGNOSIS — R200 Anesthesia of skin: Secondary | ICD-10-CM | POA: Diagnosis not present

## 2022-02-16 DIAGNOSIS — R202 Paresthesia of skin: Secondary | ICD-10-CM | POA: Diagnosis not present

## 2022-02-25 DIAGNOSIS — R202 Paresthesia of skin: Secondary | ICD-10-CM | POA: Diagnosis not present

## 2022-02-25 DIAGNOSIS — Z8669 Personal history of other diseases of the nervous system and sense organs: Secondary | ICD-10-CM | POA: Diagnosis not present

## 2022-02-25 DIAGNOSIS — R937 Abnormal findings on diagnostic imaging of other parts of musculoskeletal system: Secondary | ICD-10-CM | POA: Diagnosis not present

## 2022-02-25 DIAGNOSIS — Q068 Other specified congenital malformations of spinal cord: Secondary | ICD-10-CM | POA: Diagnosis not present

## 2022-02-27 ENCOUNTER — Other Ambulatory Visit (HOSPITAL_COMMUNITY): Payer: Self-pay

## 2022-03-03 ENCOUNTER — Other Ambulatory Visit (HOSPITAL_COMMUNITY): Payer: Self-pay

## 2022-03-04 ENCOUNTER — Encounter: Payer: Self-pay | Admitting: Family Medicine

## 2022-03-18 ENCOUNTER — Ambulatory Visit: Payer: Federal, State, Local not specified - PPO | Admitting: Dermatology

## 2022-03-18 DIAGNOSIS — R21 Rash and other nonspecific skin eruption: Secondary | ICD-10-CM | POA: Diagnosis not present

## 2022-03-18 DIAGNOSIS — L631 Alopecia universalis: Secondary | ICD-10-CM | POA: Diagnosis not present

## 2022-03-18 DIAGNOSIS — L659 Nonscarring hair loss, unspecified: Secondary | ICD-10-CM

## 2022-03-18 DIAGNOSIS — D220 Melanocytic nevi of lip: Secondary | ICD-10-CM

## 2022-03-18 DIAGNOSIS — D229 Melanocytic nevi, unspecified: Secondary | ICD-10-CM

## 2022-03-18 DIAGNOSIS — R234 Changes in skin texture: Secondary | ICD-10-CM | POA: Diagnosis not present

## 2022-03-18 DIAGNOSIS — L818 Other specified disorders of pigmentation: Secondary | ICD-10-CM

## 2022-03-18 MED ORDER — CLOBETASOL PROPIONATE 0.05 % EX CREA: TOPICAL_CREAM | CUTANEOUS | 0 refills | Status: AC

## 2022-03-18 NOTE — Patient Instructions (Addendum)
Topical steroids (such as triamcinolone, fluocinolone, fluocinonide, mometasone, clobetasol, halobetasol, betamethasone, hydrocortisone) can cause thinning and lightening of the skin if they are used for too long in the same area. Your physician has selected the right strength medicine for your problem and area affected on the body. Please use your medication only as directed by your physician to prevent side effects.  ? ? ? ? ?If You Need Anything After Your Visit ? ?If you have any questions or concerns for your doctor, please call our main line at [phone] and press option 4 to reach your doctor's medical assistant. If no one answers, please leave a voicemail as directed and we will return your call as soon as possible. Messages left after 4 pm will be answered the following business day.  ? ?You may also send [place] a message via MyChart. We typically respond to MyChart messages within 1-2 business days. ? ?For prescription refills, please ask your pharmacy to contact our office. Our fax number is [phone]. ? ?If you have an urgent issue when the clinic is closed that cannot wait until the next business day, you can page your doctor at the number below.   ? ?Please note that while we do our best to be available for urgent issues outside of office hours, we are not available 24/7.  ? ?If you have an urgent issue and are unable to reach [place], you may choose to seek medical care at your doctor's office, retail clinic, urgent care center, or emergency room. ? ?If you have a medical emergency, please immediately call 911 or go to the emergency department. ? ?Pager Numbers ? ?- Dr. Nehemiah Massed: [phone] ? ?- Dr. Laurence Ferrari: [phone] ? ?- Dr. Nicole Kindred: [phone] ? ?In the event of inclement weather, please call our main line at [phone] for an update on the status of any delays or closures. ? ?Dermatology Medication Tips: ?Please keep the boxes that topical medications come in in order to help keep track of the  instructions about where and how to use these. Pharmacies typically print the medication instructions only on the boxes and not directly on the medication tubes.  ? ?If your medication is too expensive, please contact our office at [phone] option 4 or send [place] a message through [place].  ? ?We are unable to tell what your co-pay for medications will be in advance as this is different depending on your insurance coverage. However, we may be able to find a substitute medication at lower cost or fill out paperwork to get insurance to cover a needed medication.  ? ?If a prior authorization is required to get your medication covered by your insurance company, please allow [place] 1-2 business days to complete this process. ? ?Drug prices often vary depending on where the prescription is filled and some pharmacies may offer cheaper prices. ? ?The website www.goodrx.com contains coupons for medications through different pharmacies. The prices here do not account for what the cost may be with help from insurance (it may be cheaper with your insurance), but the website can give you the price if you did not use any insurance.  ?- You can print the associated coupon and take it with your prescription to the pharmacy.  ?- You may also stop by our office during regular business hours and pick up a GoodRx coupon card.  ?- If you need your prescription sent electronically to a different pharmacy, notify our office through [facility] or by phone at [phone] option 4. ? ? ? ? ?  Si Usted Necesita Algo Despu?s de Su Visita ? ?Tambi?n puede enviarnos un mensaje a trav?s de MyChart. Por lo general respondemos a los mensajes de MyChart en el transcurso de 1 a 2 d?as h?biles. ? ?Para renovar recetas, por favor pida a su farmacia que se ponga en contacto con nuestra oficina. Nuestro n?mero de fax es el [phone]. ? ?Si tiene un asunto urgente cuando la cl?nica est? cerrada y que no puede esperar hasta el siguiente d?a h?bil,  puede llamar/localizar a su doctor(a) al n?mero que aparece a continuaci?n.  ? ?Por favor, tenga en cuenta que aunque hacemos todo lo posible para estar disponibles para asuntos urgentes fuera del horario de oficina, no estamos disponibles las 24 horas del d?a, los 7 d?as de la semana.  ? ?Si tiene un problema urgente y no puede comunicarse con nosotros, puede optar por buscar atenci?n m?dica  en el consultorio de su doctor(a), en una cl?nica privada, en un centro de atenci?n urgente o en una sala de emergencias. ? ?Si tiene Engineer, maintenance (IT) m?dica, por favor llame inmediatamente al 911 o vaya a la sala de emergencias. ? ?N?meros de b?per ? ?- Dr. Nehemiah Massed: [phone] ? ?- Dra. Moye: [phone] ? ?- Dra. Nicole Kindred: [phone] ? ?En caso de inclemencias del tiempo, por favor llame a nuestra l?nea principal al [phone] para una actualizaci?n sobre el estado de cualquier retraso o cierre. ? ?Consejos para la medicaci?n en dermatolog?a: ?Por favor, guarde las cajas en las que vienen los medicamentos de uso t?pico para ayudarle a seguir las instrucciones sobre d?nde y c?mo usarlos. Las farmacias generalmente imprimen las instrucciones del medicamento s?lo en las cajas y no directamente en los tubos del [place].  ? ?Si su medicamento es muy caro, por favor, p?ngase en contacto con Zigmund Daniel llamando al [phone] y presione la opci?n 4 o env?enos un mensaje a trav?s de MyChart.  ? ?No podemos decirle cu?l ser? su copago por los medicamentos por adelantado ya que esto es diferente dependiendo de la cobertura de su seguro. Sin embargo, es posible que podamos encontrar un medicamento sustituto a Electrical engineer un formulario para que el seguro cubra el medicamento que se considera necesario.  ? ?Si se requiere Ardelia Mems autorizaci?n previa para que su compa??a de seguros [place] su medicamento, por favor perm?tanos de 1 a 2 d?as h?biles para completar este proceso. ? ?Los precios de los medicamentos var?an con  frecuencia dependiendo del Environmental consultant de d?nde se surte la receta y alguna farmacias pueden ofrecer precios m?s baratos. ? ?El sitio web www.goodrx.com tiene cupones para medicamentos de Airline pilot. Los precios aqu? no tienen en cuenta lo que podr?a costar con la ayuda del seguro (puede ser m?s barato con su seguro), pero el sitio web puede darle el precio si no utiliz? ning?n seguro.  ?- Puede imprimir el cup?n correspondiente y llevarlo con su receta a la farmacia.  ?- Tambi?n puede pasar por nuestra oficina durante el horario de atenci?n regular y recoger una tarjeta de cupones de GoodRx.  ?- Si necesita que su receta se env?e electr?nicamente a Chiropodist, informe a nuestra oficina a trav?s de MyChart de [place] o por tel?fono llamando al [phone] y presione la opci?n 4. ? ?

## 2022-03-18 NOTE — Progress Notes (Signed)
? ?Follow-Up Visit ?  ?Subjective  ?Bridget Gould is a 46 y.o. female who presents for the following: Skin Problem (Patient here today concerning a dry circular patch at upper back husband noticed a month ago, spot at upper lip noticed a month ago, accidentally stabbed self with pencil issue on right ring finger, happened 3 months ago. ). Area is sore to touch.  Spot on upper lip can swell larger and then go down in size, no bleeding or scaling. ? ? ?Would like to discuss if any new options for alopecia. Was discussed with patient in [place] but given risk of possible blood clot and history of pulmonary embolism No treatment was recommended at this time.  ? ?The patient has spots, moles and lesions to be evaluated, some may be new or changing and the patient has concerns that these could be cancer. ? ? ?The following portions of the chart were reviewed this encounter and updated as appropriate:   ?  ? ?Review of Systems: No other skin or systemic complaints except as noted in HPI or Assessment and Plan. ? ? ?Objective  ?Well appearing patient in no apparent distress; mood and affect are within normal limits. ? ?A focused examination was performed including face, right upper lip, scalp, left posterior shoulder, right 4th finger. . Relevant physical exam findings are noted in the Assessment and Plan. ? ?right upper lip ?2.5 mm flesh papule  ? ? ? ? ? ? ?right 4th proximal nail fold ?1.5 mm blue macule  ? ?scalp ?Scalp with widespread non-scarring alopecia ? ?left posterior shoulder ?Light pink patch with central hypopigmentation and mild skin wrinkling c/w atrophy centrally, no itching per pt, no other similar rash including groin per pt  ? ? ? ? ? ? ? ? ? ?Assessment & Plan  ?Nevus ?right upper lip ? ?Nevus (with possible underlying cyst) vrs sebaceous hyperplasia ? ?Benign-appearing. Observe. ? ?Discussed will continue to monitor, if notice growing, changing, bleeding, any flaking or scale, consider biopsy.   ? ? ? ? ?Tattoo ?right 4th proximal nail fold ? ?Pencil tattoo- Patient reports stabbing right 4th finger with pencil 3 - 4 months ago and thinks a bit of the graphite is still in the skin. ? ?Benign-appearing.  Observation.  ? ?Discussed if bothering her (pt says area is sore at times) punch excision recommended.  ? ? ? ?Alopecia ?scalp ? 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 ?Hx of pulmonary embolism and has increased risk of blood clots.  No longer taking blood thinners. ? ?Alopecia present since around 2005. Previously tried oral steroids, a variety of topicals without resolution. She inquires about any new therapeutic options today. ?  ?Discussed option of oral Jak inhibitor, pulse steroids. However, she has a history of pulmonary embolus and Jak inhibitors increase risk of clots so would be contraindicated. She tried oral steroids previously without improvement. Will defer treatment at this time.  ? ? ? ?Rash and other nonspecific skin eruption ?left posterior shoulder ? 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 ?Benign observe.  ? ?Discussed treatment with topicals or bx to confirm, pt prefers to treat first and if not improving in a month will consider bx. ? ?Photos today ? ? Start clobetasol cream - Apply thin layer to aa's at left posterior shoulder bid for 2wks. If rash still persist can use qd another 2 weeks. Avoid applying to face, groin, and axilla. Use as directed. ? ?Topical steroids (such  as triamcinolone, fluocinolone, fluocinonide, mometasone, clobetasol, halobetasol, betamethasone, hydrocortisone) can cause thinning and lightening of the skin if they are used for too long in the same area. Your physician has selected the right strength medicine for your problem and area affected on the body. Please use your medication only as directed by your physician to prevent side effects.  ? ? ?clobetasol cream (TEMOVATE) 0.05 % - left  posterior shoulder ?Apply thin layer to aa's at left posterior shoulder bid for 2wks. If rash still persist can use qd after 2 weeks. Avoid applying to face, groin, and axilla. Use as directed. ? ? ?Return for 6 - 8 weeks to recheck spot at back, mole on face. ?I, Ruthell Rummage, CMA, am acting as scribe for Brendolyn Patty, MD. ? ?Documentation: I have reviewed the above documentation for accuracy and completeness, and I agree with the above. ? ?Brendolyn Patty MD  ? ?

## 2022-03-24 IMAGING — MR MR LUMBAR SPINE W/O CM
8 of 10 series · 40 of 48 positions shown · non-contrast
Comparison: Lumbar MRI 08/23/2021 and earlier.
COMPARISON: Lumbar MRI 08/23/2021 and earlier.

Addendum:
CLINICAL DATA: 45-year-old female with a history of tethered spinal
cord, terminus lipoma. Chronic low back pain.

EXAM:
MRI LUMBAR SPINE WITHOUT CONTRAST
TECHNIQUE: Multiplanar, multisequence MR imaging of the lumbar spine was
performed. No intravenous contrast was administered.

[Series 5: T2 · sagittal · 4.0mm · 1.02mm/px · 4 of 15 slices shown (1 of 6)]
[im 1/15]
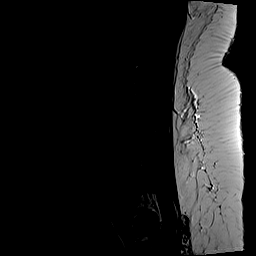
[im 5/15]
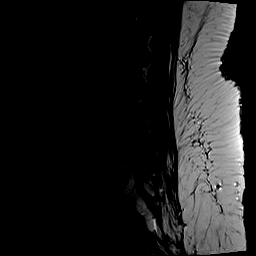
[im 10/15]
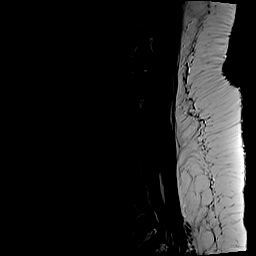
[im 15/15]
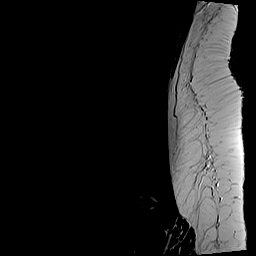

[Series 8: T1 · sagittal · 4.0mm · 0.81mm/px · 3 of 17 slices shown (1 of 2)]
[im 1/17]
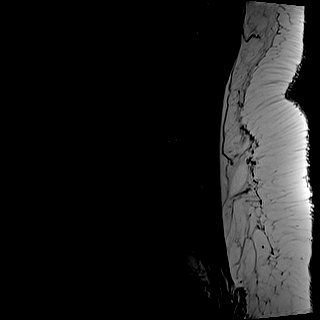
[im 9/17]
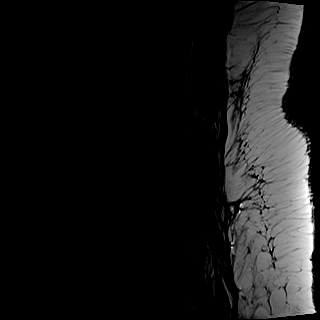
[im 17/17]
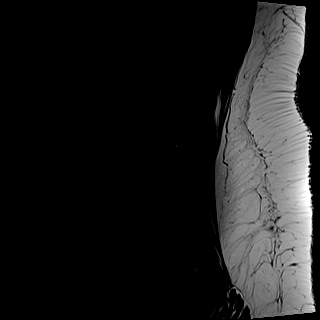

[Series 9: T2 · axial · 4.0mm · 0.78mm/px · z∈[-137,+71]mm · 7 of 36 slices shown (2 of 6)]
[im 1/36]
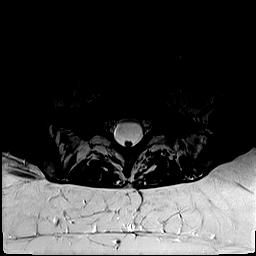
[im 6/36]
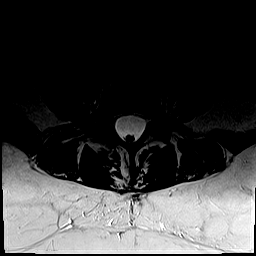
[im 12/36]
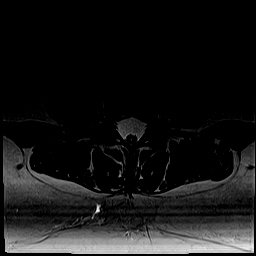
[im 18/36]
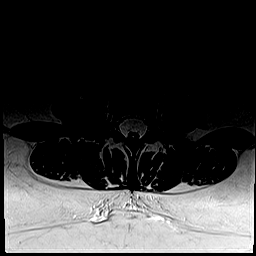
[im 24/36]
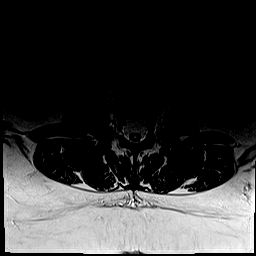
[im 30/36]
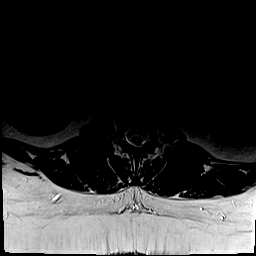
[im 36/36]
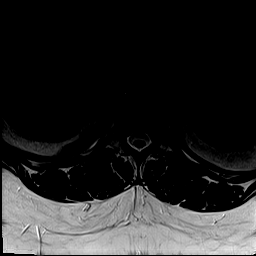

[Series 10: T1 · axial · 4.0mm · 0.39mm/px · z∈[-137,-19]mm · 4 of 36 slices shown (2 of 2)]
[im 1/36]
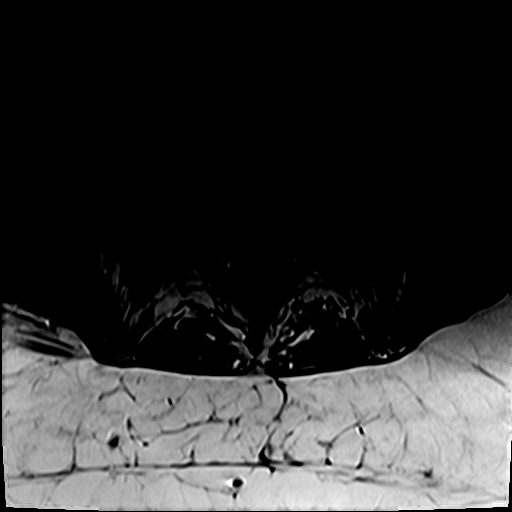
[im 6/36]
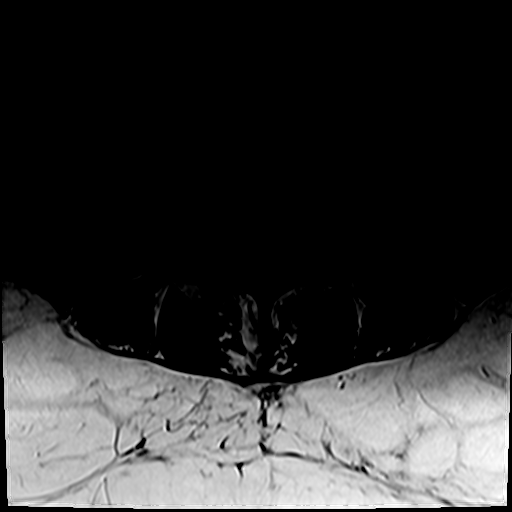
[im 12/36]
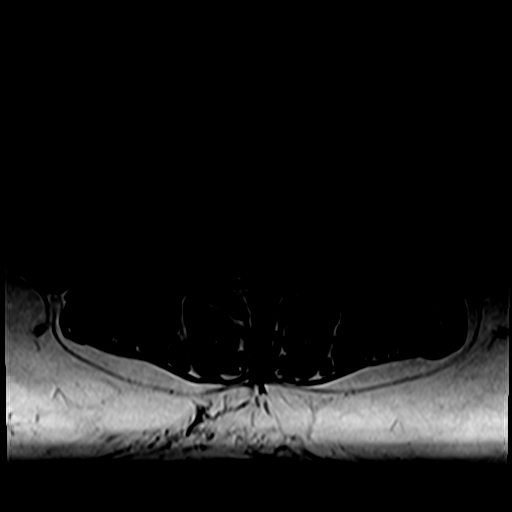
[im 18/36]
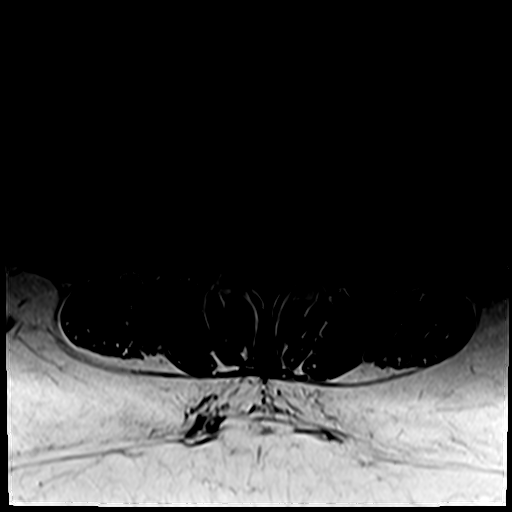

[Series 14: T2 · axial · 4.0mm · 0.78mm/px · z∈[-90,+128]mm · 8 of 45 slices shown (3 of 6)]
[im 1/45]
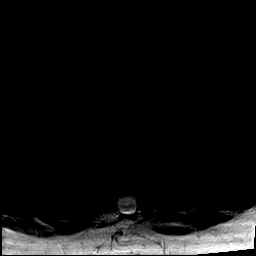
[im 7/45]
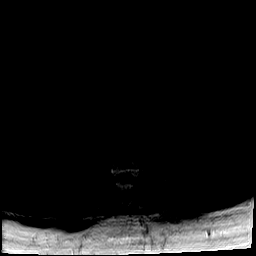
[im 13/45]
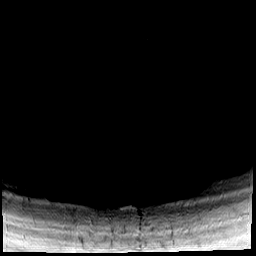
[im 19/45]
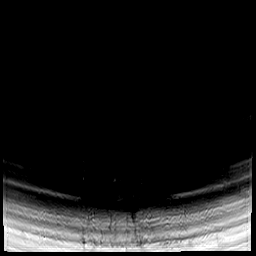
[im 26/45]
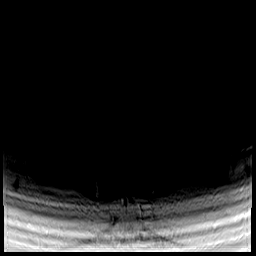
[im 32/45]
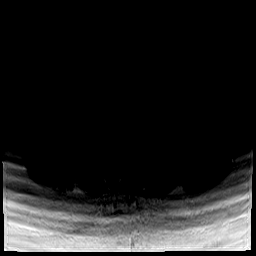
[im 38/45]
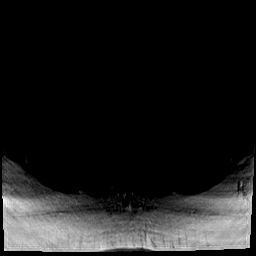
[im 45/45]
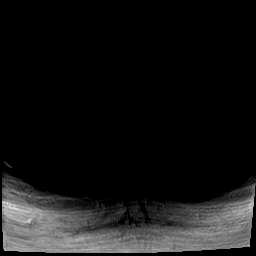

[Series 15: T2 · sagittal · 4.0mm · 0.88mm/px · 3 of 17 slices shown (4 of 6)]
[im 1/17]
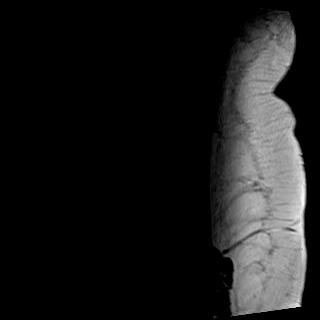
[im 9/17]
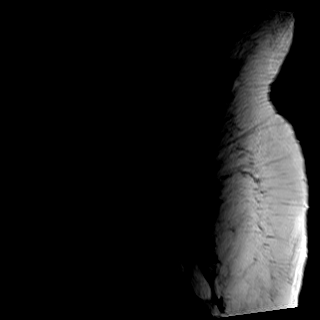
[im 17/17]
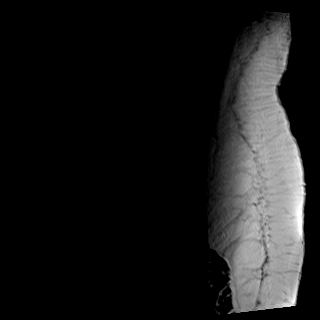

[Series 18: T2 · axial · 4.0mm · 0.78mm/px · z∈[-90,+128]mm · 8 of 45 slices shown (5 of 6)]
[im 1/45]
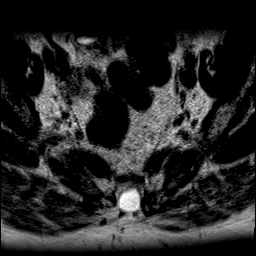
[im 7/45]
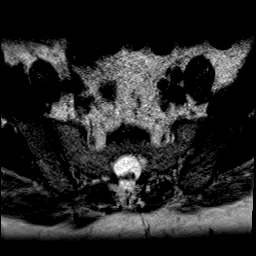
[im 13/45]
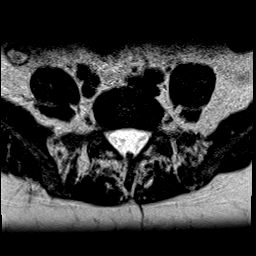
[im 19/45]
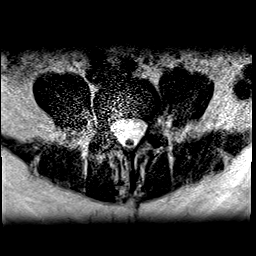
[im 26/45]
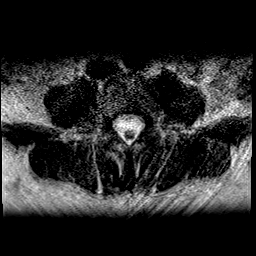
[im 32/45]
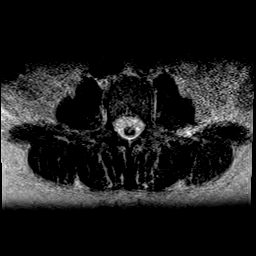
[im 38/45]
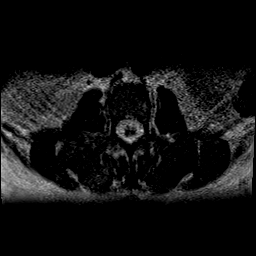
[im 45/45]
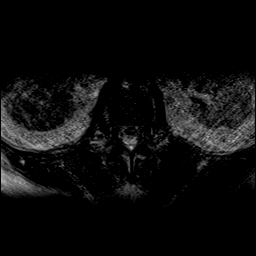

[Series 1005: T2 · sagittal · 4.0mm · 0.88mm/px · 3 of 17 slices shown (6 of 6)]
[im 1/17]
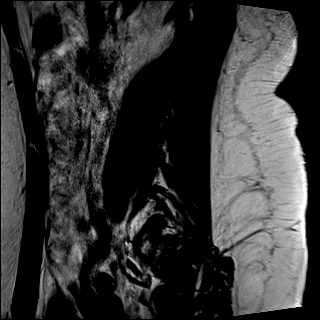
[im 9/17]
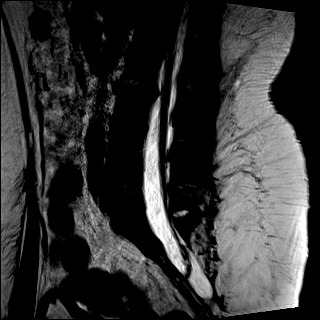
[im 17/17]
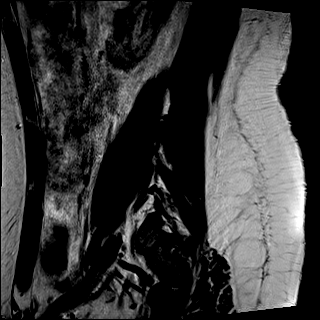

[40 of 48 positions shown; findings below may reference images not displayed]

FINDINGS: Segmentation: Normal as seen on 4292 CT. This is the same numbering
system used on the MRI last year.

Alignment: Chronic straightening of lumbar lordosis. No
spondylolisthesis. No significant scoliosis.

Vertebrae: Degenerative endplate marrow signal changes at L2-L3. No
marrow edema or evidence of acute osseous abnormality. Background
bone marrow signal within normal limits. Intact visible sacrum.

Conus medullaris and cauda equina: There is no distinct conus
medullaris owing to congenital tethered cord.

The lower thoracic cord smoothly tapers through the lumbar spine to
the level of the sacrum, and paired lumbar nerve roots are seen to
exit from the cord as expected.

Small central cord syrinx in the lumbar spine beginning at L1 and
continuing distally appears unchanged.

Seen on sagittal images there is an elongated lipoma at the tip of
the cord in the sacral spinal canal (S2-S3, series 8 image 7).

Capacious underlying spinal canal consistent with a degree of dural
ectasia.

As in [REDACTED], the lower spinal cord may be lying dependently along
the posterior thecal sac. The patient was asked to return for prone
axial and sagittal T2 imaging but at the time of this report has
been on able to return to the imaging site.

Overall unchanged appearance of the lower thecal sac and tethered
cord since last year.

Paraspinal and other soft tissues: Negative visible abdominal and
pelvic viscera. Mild postoperative changes to the posterior sacral
soft tissues.

Disc levels:

Chronic L2-L3 disc and endplate degeneration with disc bulging. Mild
to moderate chronic facet hypertrophy at L4-L5. But capacious,
ectatic lumbar spinal canal with no spinal stenosis or neural
impingement.
IMPRESSION: 1. Unchanged congenital tethered spinal cord since 7252. Stable
appearance of small syrinx at the lumbar levels and sacral level
lipoma at the filum terminalis.

2. Underlying dural ectasia. Chronic disc and endplate degeneration
at L2-L3. Mild chronic facet degeneration at L4-L5. No spinal
stenosis or neural impingement.

ADDENDUM:
The patient returned for T2 weighted prone imaging on 01/02/2022 at
9757 hours.

When prone, the spinal cord position within the capacious thecal sac
shifts ventrally above the L5 level, from L5 through the sacrum
remains closely approximated to the dorsal thecal sac., and on
series 15, image 9 there is evidence of a dorsal adhesion to the
lipomatous portion of the filum terminalis at the S2 level. And on
series 18, image 41 at the S3 level the filum demonstrates a
prominent horizontal septation which may also be adhesed.
CONCLUSION: Evidence of S2 and S3 level intrathecal adhesions of the filum
terminalis when prone.

*** End of Addendum ***
FINDINGS: Segmentation: Normal as seen on 4292 CT. This is the same numbering
system used on the MRI last year.

Alignment: Chronic straightening of lumbar lordosis. No
spondylolisthesis. No significant scoliosis.

Vertebrae: Degenerative endplate marrow signal changes at L2-L3. No
marrow edema or evidence of acute osseous abnormality. Background
bone marrow signal within normal limits. Intact visible sacrum.

Conus medullaris and cauda equina: There is no distinct conus
medullaris owing to congenital tethered cord.

The lower thoracic cord smoothly tapers through the lumbar spine to
the level of the sacrum, and paired lumbar nerve roots are seen to
exit from the cord as expected.

Small central cord syrinx in the lumbar spine beginning at L1 and
continuing distally appears unchanged.

Seen on sagittal images there is an elongated lipoma at the tip of
the cord in the sacral spinal canal (S2-S3, series 8 image 7).

Capacious underlying spinal canal consistent with a degree of dural
ectasia.

As in [REDACTED], the lower spinal cord may be lying dependently along
the posterior thecal sac. The patient was asked to return for prone
axial and sagittal T2 imaging but at the time of this report has
been on able to return to the imaging site.

Overall unchanged appearance of the lower thecal sac and tethered
cord since last year.

Paraspinal and other soft tissues: Negative visible abdominal and
pelvic viscera. Mild postoperative changes to the posterior sacral
soft tissues.

Disc levels:

Chronic L2-L3 disc and endplate degeneration with disc bulging. Mild
to moderate chronic facet hypertrophy at L4-L5. But capacious,
ectatic lumbar spinal canal with no spinal stenosis or neural
impingement.
IMPRESSION: 1. Unchanged congenital tethered spinal cord since 7252. Stable
appearance of small syrinx at the lumbar levels and sacral level
lipoma at the filum terminalis.

2. Underlying dural ectasia. Chronic disc and endplate degeneration
at L2-L3. Mild chronic facet degeneration at L4-L5. No spinal
stenosis or neural impingement.

## 2022-03-26 ENCOUNTER — Other Ambulatory Visit (HOSPITAL_COMMUNITY): Payer: Self-pay

## 2022-03-31 DIAGNOSIS — Q068 Other specified congenital malformations of spinal cord: Secondary | ICD-10-CM | POA: Diagnosis not present

## 2022-03-31 DIAGNOSIS — M5412 Radiculopathy, cervical region: Secondary | ICD-10-CM | POA: Diagnosis not present

## 2022-03-31 DIAGNOSIS — M4802 Spinal stenosis, cervical region: Secondary | ICD-10-CM | POA: Diagnosis not present

## 2022-04-04 ENCOUNTER — Other Ambulatory Visit: Payer: Self-pay | Admitting: Neurosurgery

## 2022-04-04 DIAGNOSIS — M5412 Radiculopathy, cervical region: Secondary | ICD-10-CM

## 2022-04-14 ENCOUNTER — Other Ambulatory Visit (HOSPITAL_COMMUNITY): Payer: Self-pay

## 2022-04-24 ENCOUNTER — Other Ambulatory Visit (HOSPITAL_COMMUNITY): Payer: Self-pay

## 2022-04-28 ENCOUNTER — Ambulatory Visit: Payer: Federal, State, Local not specified - PPO | Admitting: Dermatology

## 2022-04-29 ENCOUNTER — Ambulatory Visit: Payer: Federal, State, Local not specified - PPO | Admitting: Dermatology

## 2022-04-29 DIAGNOSIS — M5136 Other intervertebral disc degeneration, lumbar region: Secondary | ICD-10-CM | POA: Diagnosis not present

## 2022-04-29 DIAGNOSIS — M5416 Radiculopathy, lumbar region: Secondary | ICD-10-CM | POA: Diagnosis not present

## 2022-05-01 ENCOUNTER — Ambulatory Visit: Payer: Federal, State, Local not specified - PPO | Admitting: Family Medicine

## 2022-05-01 ENCOUNTER — Encounter: Payer: Self-pay | Admitting: Family Medicine

## 2022-05-02 ENCOUNTER — Ambulatory Visit
Admission: RE | Admit: 2022-05-02 | Discharge: 2022-05-02 | Disposition: A | Discharge status: Home / Self Care | Payer: Federal, State, Local not specified - PPO | Source: Ambulatory Visit | Attending: Neurosurgery | Admitting: Neurosurgery

## 2022-05-02 DIAGNOSIS — Z86018 Personal history of other benign neoplasm: Secondary | ICD-10-CM | POA: Diagnosis not present

## 2022-05-02 DIAGNOSIS — M2578 Osteophyte, vertebrae: Secondary | ICD-10-CM | POA: Diagnosis not present

## 2022-05-02 DIAGNOSIS — M4802 Spinal stenosis, cervical region: Secondary | ICD-10-CM | POA: Diagnosis not present

## 2022-05-02 DIAGNOSIS — M5412 Radiculopathy, cervical region: Secondary | ICD-10-CM | POA: Insufficient documentation

## 2022-05-04 ENCOUNTER — Ambulatory Visit: Payer: Federal, State, Local not specified - PPO | Admitting: Family Medicine

## 2022-05-04 ENCOUNTER — Encounter: Payer: Self-pay | Admitting: Family Medicine

## 2022-05-04 DIAGNOSIS — F32A Depression, unspecified: Secondary | ICD-10-CM

## 2022-05-04 DIAGNOSIS — F419 Anxiety disorder, unspecified: Secondary | ICD-10-CM | POA: Diagnosis not present

## 2022-05-04 DIAGNOSIS — I10 Essential (primary) hypertension: Secondary | ICD-10-CM

## 2022-05-04 MED ORDER — AMLODIPINE BESYLATE 5 MG PO TABS: 5.0000 mg | ORAL_TABLET | Freq: Every day | ORAL | 1 refills | Status: DC

## 2022-05-04 MED ORDER — VENLAFAXINE HCL ER 37.5 MG PO CP24: ORAL_CAPSULE | ORAL | 0 refills | Status: DC

## 2022-05-04 NOTE — Progress Notes (Signed)
Bridget Rumps, MD Phone: [phone]  Bridget Gould is a 46 y.o. female who presents today for f/u.  Hypertension: Patient has been checking her blood pressure and has had some elevations as outlined in her MyChart message from 05/01/2022.  She notes no chest pain, shortness of breath, or edema.  She notes she has been on lisinopril in the past.  She does take Effexor and notes no anxiety or depression.  She also notes constant discomfort in her right side for which she has been seeing multiple specialist.  She wonders if that pain has been contributing to her blood pressure.  Obesity: Patient has been on Wegovy 2.4 mg weekly.  She notes this has reduced her appetite.  She continues with a healthy Mediterranean diet.  She does exercise 5 days a week.  Social History   Tobacco Use  Smoking Status Never  Smokeless Tobacco Never    Current Outpatient Medications on File Prior to Visit  Medication Sig Dispense Refill   clobetasol cream (TEMOVATE) 0.05 % Apply thin layer to aa's at left posterior shoulder bid for 2wks. If rash still persist can use qd after 2 weeks. Avoid applying to face, groin, and axilla. Use as directed. 30 g 0   cyclobenzaprine (FLEXERIL) 10 MG tablet Take 10 mg by mouth 3 (three) times daily as needed for muscle spasms.     gabapentin (NEURONTIN) 100 MG capsule Take 100 capsules by mouth daily.     norethindrone (MICRONOR) 0.35 MG tablet Take 1 tablet (0.35 mg total) by mouth daily. 84 tablet 3   Semaglutide-Weight Management 2.4 MG/0.75ML SOAJ Inject 2.4 mg into the skin once a week for 28 days. 3 mL 2   sulfamethoxazole-trimethoprim (BACTRIM,SEPTRA) 400-80 MG tablet Take 0.5 tablets by mouth daily.  1   No current facility-administered medications on file prior to visit.     ROS see history of present illness  Objective  Physical Exam Vitals:   05/04/22 1542  BP: 128/76  Pulse: (!) 104  Temp: 98.3 F (36.8 C)  SpO2: 98%    BP Readings from Last 3  Encounters:  05/04/22 128/76  01/28/22 130/80  12/17/21 120/80   Wt Readings from Last 3 Encounters:  05/04/22 286 lb (129.7 kg)  01/28/22 290 lb 12.8 oz (131.9 kg)  12/17/21 294 lb 9.6 oz (133.6 kg)    Physical Exam Constitutional:      General: She is not in acute distress.    Appearance: She is not diaphoretic.  Cardiovascular:     Rate and Rhythm: Normal rate and regular rhythm.     Heart sounds: Normal heart sounds.  Pulmonary:     Effort: Pulmonary effort is normal.     Breath sounds: Normal breath sounds.  Skin:    General: Skin is warm and dry.  Neurological:     Mental Status: She is alert.      Assessment/Plan: Please see individual problem list.  Problem List Items Addressed This Visit     Anxiety and depression (Chronic)    Asymptomatic.  We will taper her off of the Effexor slowly.  She will reduce her Effexor dose to 112.5 mg daily for 14 days then she will decrease to 75 mg daily for 14 days then she will decrease to 37.5 mg daily for 14 days.  Then she will discontinue the Effexor.  If she notices any odd or new symptoms she will go back to her prior dose and let us know.  Relevant Medications   venlafaxine XR (EFFEXOR-XR) 37.5 MG 24 hr capsule   Hypertension (Chronic)    BP is trended up.  This may be related to the pain she has in the right side or her Effexor.  We will work on tapering her off of the Effexor.  We will go ahead and start on amlodipine 5 mg once daily to help reduce her blood pressure.  Discussed the risk of constipation and swelling in her legs with the amlodipine.  She will send me a message with her readings in a few weeks.  I will see her back in 6 weeks..      Relevant Medications   amLODipine (NORVASC) 5 MG tablet   Morbid obesity (HCC) (Chronic)    Weight is slowly trending down.  She will continue healthy diet and exercise.  She will remain on Wegovy 2.4 mg weekly.      Other Visit Diagnoses     Anxiety       Relevant  Medications   venlafaxine XR (EFFEXOR-XR) 37.5 MG 24 hr capsule      Return in about 6 weeks (around 06/15/2022) for Blood pressure.   Bridget Rumps, MD [place]

## 2022-05-04 NOTE — Telephone Encounter (Signed)
Plan to discuss BP today at her visit.

## 2022-05-04 NOTE — Assessment & Plan Note (Signed)
Weight is slowly trending down.  She will continue healthy diet and exercise.  She will remain on Wegovy 2.4 mg weekly.

## 2022-05-04 NOTE — Assessment & Plan Note (Signed)
BP is trended up.  This may be related to the pain she has in the right side or her Effexor.  We will work on tapering her off of the Effexor.  We will go ahead and start on amlodipine 5 mg once daily to help reduce her blood pressure.  Discussed the risk of constipation and swelling in her legs with the amlodipine.  She will send me a message with her readings in a few weeks.  I will see her back in 6 weeks.Marland Kitchen

## 2022-05-04 NOTE — Patient Instructions (Signed)
Nice to see you. We will reduce the dose of your Effexor as outlined on your prescription.  If you notice any new or odd symptoms or worsening anxiety or depression you need to let us know immediately and go back to the prior dose. We will start you on amlodipine for your blood pressure.  Please send me a list of your blood pressure readings in a few weeks.

## 2022-05-04 NOTE — Assessment & Plan Note (Addendum)
Asymptomatic.  We will taper her off of the Effexor slowly.  She will reduce her Effexor dose to 112.5 mg daily for 14 days then she will decrease to 75 mg daily for 14 days then she will decrease to 37.5 mg daily for 14 days.  Then she will discontinue the Effexor.  If she notices any odd or new symptoms she will go back to her prior dose and let us know.

## 2022-05-22 ENCOUNTER — Other Ambulatory Visit (HOSPITAL_COMMUNITY): Payer: Self-pay

## 2022-05-22 ENCOUNTER — Other Ambulatory Visit: Payer: Self-pay | Admitting: Family Medicine

## 2022-05-22 MED ORDER — WEGOVY 2.4 MG/0.75ML ~~LOC~~ SOAJ
2.4000 mg | SUBCUTANEOUS | 2 refills | Status: DC
  Filled 2022-05-22: qty 3, 28d supply, fill #0
  Filled 2022-06-17: qty 3, 28d supply, fill #1
  Filled 2022-07-22: qty 3, 28d supply, fill #2

## 2022-05-26 DIAGNOSIS — M542 Cervicalgia: Secondary | ICD-10-CM | POA: Diagnosis not present

## 2022-05-26 DIAGNOSIS — M533 Sacrococcygeal disorders, not elsewhere classified: Secondary | ICD-10-CM | POA: Diagnosis not present

## 2022-05-26 DIAGNOSIS — M5459 Other low back pain: Secondary | ICD-10-CM | POA: Diagnosis not present

## 2022-06-02 DIAGNOSIS — M542 Cervicalgia: Secondary | ICD-10-CM | POA: Diagnosis not present

## 2022-06-02 DIAGNOSIS — M533 Sacrococcygeal disorders, not elsewhere classified: Secondary | ICD-10-CM | POA: Diagnosis not present

## 2022-06-02 DIAGNOSIS — M5459 Other low back pain: Secondary | ICD-10-CM | POA: Diagnosis not present

## 2022-06-02 DIAGNOSIS — M5412 Radiculopathy, cervical region: Secondary | ICD-10-CM | POA: Diagnosis not present

## 2022-06-04 DIAGNOSIS — M542 Cervicalgia: Secondary | ICD-10-CM | POA: Diagnosis not present

## 2022-06-04 DIAGNOSIS — M533 Sacrococcygeal disorders, not elsewhere classified: Secondary | ICD-10-CM | POA: Diagnosis not present

## 2022-06-04 DIAGNOSIS — M5459 Other low back pain: Secondary | ICD-10-CM | POA: Diagnosis not present

## 2022-06-09 DIAGNOSIS — M533 Sacrococcygeal disorders, not elsewhere classified: Secondary | ICD-10-CM | POA: Diagnosis not present

## 2022-06-09 DIAGNOSIS — M542 Cervicalgia: Secondary | ICD-10-CM | POA: Diagnosis not present

## 2022-06-09 DIAGNOSIS — M5459 Other low back pain: Secondary | ICD-10-CM | POA: Diagnosis not present

## 2022-06-11 DIAGNOSIS — M533 Sacrococcygeal disorders, not elsewhere classified: Secondary | ICD-10-CM | POA: Diagnosis not present

## 2022-06-11 DIAGNOSIS — M542 Cervicalgia: Secondary | ICD-10-CM | POA: Diagnosis not present

## 2022-06-11 DIAGNOSIS — M5459 Other low back pain: Secondary | ICD-10-CM | POA: Diagnosis not present

## 2022-06-15 ENCOUNTER — Ambulatory Visit: Payer: Federal, State, Local not specified - PPO | Admitting: Family Medicine

## 2022-06-15 ENCOUNTER — Encounter: Payer: Self-pay | Admitting: Family Medicine

## 2022-06-15 DIAGNOSIS — I10 Essential (primary) hypertension: Secondary | ICD-10-CM

## 2022-06-15 DIAGNOSIS — M5416 Radiculopathy, lumbar region: Secondary | ICD-10-CM | POA: Diagnosis not present

## 2022-06-15 DIAGNOSIS — M5136 Other intervertebral disc degeneration, lumbar region: Secondary | ICD-10-CM | POA: Diagnosis not present

## 2022-06-15 NOTE — Assessment & Plan Note (Signed)
Adequately controlled. She will continue the amlodipine 5 mg daily and continue to taper off the effexor.

## 2022-06-15 NOTE — Progress Notes (Signed)
[name], MD Phone: [phone]  Bridget Gould is a 46 y.o. female who presents today for f/u  HYPERTENSION Disease Monitoring Home BP Monitoring not checking Chest pain- no    Dyspnea- no Medications Compliance-  taking amlodipine  Edema- no BMET    Component Value Date/Time   NA 139 09/01/2021 0906   NA 139 01/15/2014 0922   K 4.4 09/01/2021 0906   K 3.9 01/15/2014 0922   CL 105 09/01/2021 0906   CL 109 (H) 01/15/2014 0922   CO2 26 09/01/2021 0906   CO2 24 01/15/2014 0922   GLUCOSE 87 09/01/2021 0906   GLUCOSE 90 01/15/2014 0922   BUN 11 09/01/2021 0906   BUN 17 01/15/2014 0922   CREATININE 0.88 09/01/2021 0906   CREATININE 0.82 01/15/2014 0922   CALCIUM 8.8 09/01/2021 0906   CALCIUM 8.4 (L) 01/15/2014 0922   GFRNONAA >60 01/15/2014 0922   GFRAA >60 01/15/2014 0277   Has been tapering off of effexor. Has done well with the exception of the first week of the taper when she had some crying episodes though notes she is fine now.   Social History   Tobacco Use  Smoking Status Never  Smokeless Tobacco Never    Current Outpatient Medications on File Prior to Visit  Medication Sig Dispense Refill   amLODipine (NORVASC) 5 MG tablet Take 1 tablet (5 mg total) by mouth daily. 90 tablet 1   clobetasol cream (TEMOVATE) 0.05 % Apply thin layer to aa's at left posterior shoulder bid for 2wks. If rash still persist can use qd after 2 weeks. Avoid applying to face, groin, and axilla. Use as directed. 30 g 0   cyclobenzaprine (FLEXERIL) 10 MG tablet Take 10 mg by mouth 3 (three) times daily as needed for muscle spasms.     gabapentin (NEURONTIN) 100 MG capsule Take 100 capsules by mouth daily.     norethindrone (MICRONOR) 0.35 MG tablet Take 1 tablet (0.35 mg total) by mouth daily. 84 tablet 3   Semaglutide-Weight Management (WEGOVY) 2.4 MG/0.75ML SOAJ Inject 2.4 mg into the skin once a week for 28 days. 3 mL 2   sulfamethoxazole-trimethoprim (BACTRIM,SEPTRA) 400-80 MG  tablet Take 0.5 tablets by mouth daily.  1   venlafaxine XR (EFFEXOR-XR) 37.5 MG 24 hr capsule Take 3 capsules (112.5 mg total) by mouth daily with breakfast for 14 days, THEN 2 capsules (75 mg total) daily with breakfast for 14 days, THEN 1 capsule (37.5 mg total) daily with breakfast for 14 days. TAKE 1 CAPSULE BY MOUTH EVERY DAY WITH BREAKFAST. 84 capsule 0   No current facility-administered medications on file prior to visit.     ROS see history of present illness  Objective  Physical Exam Vitals:   06/15/22 1607  BP: 130/80  Pulse: 86  Temp: 98.5 F (36.9 C)  SpO2: 98%    BP Readings from Last 3 Encounters:  06/15/22 130/80  05/04/22 128/76  01/28/22 130/80   Wt Readings from Last 3 Encounters:  06/15/22 281 lb 3.2 oz (127.6 kg)  05/04/22 286 lb (129.7 kg)  01/28/22 290 lb 12.8 oz (131.9 kg)    Physical Exam Constitutional:      General: She is not in acute distress.    Appearance: She is not diaphoretic.  Cardiovascular:     Rate and Rhythm: Normal rate and regular rhythm.     Heart sounds: Normal heart sounds.  Pulmonary:     Effort: Pulmonary effort is normal.  Breath sounds: Normal breath sounds.  Skin:    General: Skin is warm and dry.  Neurological:     Mental Status: She is alert.      Assessment/Plan: Please see individual problem list.  Problem List Items Addressed This Visit     Hypertension (Chronic)    Adequately controlled. She will continue the amlodipine 5 mg daily and continue to taper off the effexor.          Return in about 3 months (around 09/15/2022).   [name], MD [place]

## 2022-06-16 DIAGNOSIS — M533 Sacrococcygeal disorders, not elsewhere classified: Secondary | ICD-10-CM | POA: Diagnosis not present

## 2022-06-16 DIAGNOSIS — M5459 Other low back pain: Secondary | ICD-10-CM | POA: Diagnosis not present

## 2022-06-16 DIAGNOSIS — M542 Cervicalgia: Secondary | ICD-10-CM | POA: Diagnosis not present

## 2022-06-17 ENCOUNTER — Other Ambulatory Visit (HOSPITAL_COMMUNITY): Payer: Self-pay

## 2022-06-18 ENCOUNTER — Ambulatory Visit: Payer: Federal, State, Local not specified - PPO | Admitting: Dermatology

## 2022-06-18 ENCOUNTER — Encounter: Payer: Self-pay | Admitting: Dermatology

## 2022-06-18 DIAGNOSIS — R21 Rash and other nonspecific skin eruption: Secondary | ICD-10-CM

## 2022-06-18 DIAGNOSIS — D229 Melanocytic nevi, unspecified: Secondary | ICD-10-CM

## 2022-06-18 DIAGNOSIS — L304 Erythema intertrigo: Secondary | ICD-10-CM

## 2022-06-18 DIAGNOSIS — L24A2 Irritant contact dermatitis due to fecal, urinary or dual incontinence: Secondary | ICD-10-CM

## 2022-06-18 DIAGNOSIS — D220 Melanocytic nevi of lip: Secondary | ICD-10-CM

## 2022-06-18 MED ORDER — KETOCONAZOLE 2 % EX CREA
TOPICAL_CREAM | CUTANEOUS | 3 refills | Status: DC
Start: 2022-06-18 — End: 2022-11-16

## 2022-06-18 MED ORDER — HYDROCORTISONE 2.5 % EX CREA: TOPICAL_CREAM | CUTANEOUS | 3 refills | Status: DC

## 2022-06-18 NOTE — Progress Notes (Signed)
   Follow-Up Visit   Subjective  Bridget Gould is a 46 y.o. female who presents for the following: Nevus (3 month recheck. Mole at right upper lip. Patient reports no changes since last visit) and Rash (Has been using Clobetasol cream as directed. Patient reports no change. Has not improved or worsened. Has been here ~1 year. Has not changed in that time. No itching per pt. No pain or burning with intercourse or urination. Does have irritation secondary to neurogenic bladder).  The following portions of the chart were reviewed this encounter and updated as appropriate:  Tobacco  Allergies  Meds  Problems  Med Hx  Surg Hx  Fam Hx      Review of Systems: No other skin or systemic complaints except as noted in HPI or Assessment and Plan.   Objective  Well appearing patient in no apparent distress; mood and affect are within normal limits.  A focused examination was performed including face, back, groin. Relevant physical exam findings are noted in the Assessment and Plan.  Right Upper Cutaneous Lip 2.5 mm flesh-colored papule without features suspicious for malignancy on dermoscopy  Left posterior shoulder Light pink patch with central hypopigmentation and mild skin wrinkling c/w atrophy centrally. Vaginal/vulvar area with mild pinkness and inflammation, no indications of LS et A.   Pubic Vaginal/vulvar area with mild pinkness and inflammation, no indications of LS et A.    Assessment & Plan  Nevus Right Upper Cutaneous Lip  Benign-appearing.  Observation.  Call clinic for new or changing lesions.  Recommend daily use of broad spectrum spf 30+ sunscreen to sun-exposed areas.    Rash Left posterior shoulder  Chronic condition, no cure, only control.  Currently well-controlled.   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 No evidence of active inflammation today. Asymptomatic currently.   Will monitor for progression or new areas of involvement. Discussed biopsy  to confirm diagnosis, deferred today.  Discussed risk of vaginal/perianal involvement and risk of cancer with uncontrolled vaginal/perianal LSetA. Examined today and rash at vaginal area c/w irritant contact dermatitis but no signs of LSetA  Continue clobetasol cream twice a day as needed for recurrent irritation/tenderness/itch or growing rash at the shoulder or for new spots. Call for new spots.   Irritant contact dermatitis due to urinary incontinence Pubic  Start hydrocortisone 2.5% cream twice a day as needed up to 2 weeks Start ketoconazole cream twice a day while using hydrocortisone to minimize risk of triggering yeast/candidal infection Recommend zinc oxide paste as a barrier against moisture  Follow-up with urogynecology regarding urinary incontinence   hydrocortisone 2.5 % cream - Pubic Apply twice daily to affected areas up 2 weeks PRN irritation  ketoconazole (NIZORAL) 2 % cream - Pubic Apply twice daily as needed for itching   Return in about 6 weeks (around 07/30/2022) for Rash Follow Up.  I, Emelia Salisbury, CMA, am acting as scribe for Forest Gleason, MD.  Documentation: I have reviewed the above documentation for accuracy and completeness, and I agree with the above.  Forest Gleason, MD

## 2022-06-18 NOTE — Patient Instructions (Addendum)
For itch/rash at vaginal area, apply ketoconazole cream twice a day anytime the rash or itch is there. Apply hydrocortisone 2.5% cream twice a day for up to 2 weeks for itch or rash.  When rash is clear, you can protect the skin and help prevent the rash from coming back with Desitin multi-purpose ointment (less messy - goes on clear) or zinc oxide paste (messier, but more effective if you keep getting rash despite using the multi-purpose ointment). For a more budget friendly option, plain vaseline may also be effective.   If the spot at your back gets itchy, red or tender, restart clobetasol twice a day for up to 2 weeks and let us know. If you develop rashes anywhere else, please let us know so we can check the area.    Topical retinoid medications like tretinoin/Retin-A, adapalene/Differin, tazarotene/Fabior, and Epiduo/Epiduo Forte can cause dryness and irritation when first started. Only apply a pea-sized amount to the entire affected area. Avoid applying it around the eyes, edges of mouth and creases at the nose. If you experience irritation, use a good moisturizer first and/or apply the medicine less often. If you are doing well with the medicine, you can increase how often you use it until you are applying every night. Be careful with sun protection while using this medication as it can make you sensitive to the sun. This medicine should not be used by pregnant women.    Due to recent changes in healthcare laws, you may see results of your pathology and/or laboratory studies on MyChart before the doctors have had a chance to review them. We understand that in some cases there may be results that are confusing or concerning to you. Please understand that not all results are received at the same time and often the doctors may need to interpret multiple results in order to provide you with the best plan of care or course of treatment. Therefore, we ask that you please give [place] 2 business days to  thoroughly review all your results before contacting the office for clarification. Should we see a critical lab result, you will be contacted sooner.   If You Need Anything After Your Visit  If you have any questions or concerns for your doctor, please call our main line at [phone] and press option 4 to reach your doctor's medical assistant. If no one answers, please leave a voicemail as directed and we will return your call as soon as possible. Messages left after 4 pm will be answered the following business day.   You may also send [place] a message via [place]. We typically respond to MyChart messages within 1-2 business days.  For prescription refills, please ask your pharmacy to contact our office. Our fax number is [phone].  If you have an urgent issue when the clinic is closed that cannot wait until the next business day, you can page your doctor at the number below.    Please note that while we do our best to be available for urgent issues outside of office hours, we are not available 24/7.   If you have an urgent issue and are unable to reach [place], you may choose to seek medical care at your doctor's office, retail clinic, urgent care center, or emergency room.  If you have a medical emergency, please immediately call 911 or go to the emergency department.  Pager Numbers  - Dr. Nehemiah Massed: [phone]  - Dr. Laurence Ferrari: [phone]  - Dr. Nicole Kindred: [phone]  In the event  of inclement weather, please call our main line at [phone] for an update on the status of any delays or closures.  Dermatology Medication Tips: Please keep the boxes that topical medications come in in order to help keep track of the instructions about where and how to use these. Pharmacies typically print the medication instructions only on the boxes and not directly on the medication tubes.   If your medication is too expensive, please contact our office at [phone] option 4 or send [place] a message  through [place].   We are unable to tell what your co-pay for medications will be in advance as this is different depending on your insurance coverage. However, we may be able to find a substitute medication at lower cost or fill out paperwork to get insurance to cover a needed medication.   If a prior authorization is required to get your medication covered by your insurance company, please allow [place] 1-2 business days to complete this process.  Drug prices often vary depending on where the prescription is filled and some pharmacies may offer cheaper prices.  The website www.goodrx.com contains coupons for medications through different pharmacies. The prices here do not account for what the cost may be with help from insurance (it may be cheaper with your insurance), but the website can give you the price if you did not use any insurance.  - You can print the associated coupon and take it with your prescription to the pharmacy.  - You may also stop by our office during regular business hours and pick up a GoodRx coupon card.  - If you need your prescription sent electronically to a different pharmacy, notify our office through [facility] or by phone at [phone] option 4.     Si Usted Necesita Algo Despus de Su Visita  Tambin puede enviarnos un mensaje a travs de Pharmacist, community. Por lo general respondemos a los mensajes de MyChart en el transcurso de 1 a 2 das hbiles.  Para renovar recetas, por favor pida a su farmacia que se ponga en contacto con nuestra oficina. Harland Dingwall de fax es [place] [phone].  Si tiene un asunto urgente cuando la clnica est cerrada y que no puede esperar hasta el siguiente da hbil, puede llamar/localizar a su doctor(a) al nmero que aparece a continuacin.   Por favor, tenga en cuenta que aunque hacemos todo lo posible para estar disponibles para asuntos urgentes fuera del horario de [place], no estamos disponibles las 24 horas del da, los 7 das de  la [place].   Si tiene un problema urgente y no puede comunicarse con nosotros, puede optar por buscar atencin mdica  en el consultorio de su doctor(a), en una clnica privada, en un centro de atencin urgente o en una sala de emergencias.  Si tiene Engineering geologist, por favor llame inmediatamente al 911 o vaya a la sala de emergencias.  Nmeros de bper  - Dr. Nehemiah Massed: [phone]  - Dra. Moye: [phone]  - Dra. Nicole Kindred: [phone]  En caso de inclemencias del [place], por favor llame a Johnsie Kindred principal al [phone] para una actualizacin sobre el [place] de cualquier retraso o cierre.  Consejos para la medicacin en dermatologa: Por favor, guarde las cajas en las que vienen los medicamentos de uso tpico para ayudarle a seguir las instrucciones sobre dnde y cmo usarlos. Las farmacias generalmente imprimen las instrucciones del medicamento slo en las cajas y no directamente en los tubos del [place].   Si su medicamento  es muy caro, por favor, pngase en contacto con Zigmund Daniel llamando al [phone] y presione la opcin 4 o envenos un mensaje a travs de Pharmacist, community.   No podemos decirle cul ser su copago por los medicamentos por adelantado ya que esto es diferente dependiendo de la cobertura de su seguro. Sin embargo, es posible que podamos encontrar un medicamento sustituto a Electrical engineer un formulario para que el seguro cubra el medicamento que se considera necesario.   Si se requiere una autorizacin previa para que su compaa de seguros [place] su medicamento, por favor permtanos de 1 a 2 das hbiles para completar este proceso.  Los precios de los medicamentos varan con frecuencia dependiendo del Environmental consultant de dnde se surte la receta y alguna farmacias pueden ofrecer precios ms baratos.  El sitio web www.goodrx.com tiene cupones para medicamentos de Airline pilot. Los precios aqu no tienen en cuenta lo que podra costar con la ayuda  del seguro (puede ser ms barato con su seguro), pero el sitio web puede darle el precio si no utiliz Research scientist (physical sciences).  - Puede imprimir el cupn correspondiente y llevarlo con su receta a la farmacia.  - Tambin puede pasar por nuestra oficina durante el horario de atencin regular y Charity fundraiser una tarjeta de cupones de GoodRx.  - Si necesita que su receta se enve electrnicamente a una farmacia diferente, informe a nuestra oficina a travs de MyChart de [place] o por telfono llamando al [phone] y presione la opcin 4.

## 2022-06-19 ENCOUNTER — Telehealth: Payer: Self-pay

## 2022-06-19 DIAGNOSIS — N130 Hydronephrosis with ureteropelvic junction obstruction: Secondary | ICD-10-CM | POA: Diagnosis not present

## 2022-06-19 NOTE — Telephone Encounter (Signed)
Initiated PA via covermymeds on 06/19/22 for pt's Wegovy 2.'4MG'$ /0.75ML auto-injectors  Key: [id number]  Per covermymeds:A PA is already in process for this member/drug. For further inquiries please contact the number on the back of the member prescription card.  Nothing further needed. CMA will f/u w/request.

## 2022-06-22 ENCOUNTER — Other Ambulatory Visit: Payer: Self-pay | Admitting: Family Medicine

## 2022-06-22 DIAGNOSIS — F419 Anxiety disorder, unspecified: Secondary | ICD-10-CM

## 2022-06-23 DIAGNOSIS — M5459 Other low back pain: Secondary | ICD-10-CM | POA: Diagnosis not present

## 2022-06-23 DIAGNOSIS — M542 Cervicalgia: Secondary | ICD-10-CM | POA: Diagnosis not present

## 2022-06-23 DIAGNOSIS — M533 Sacrococcygeal disorders, not elsewhere classified: Secondary | ICD-10-CM | POA: Diagnosis not present

## 2022-06-24 ENCOUNTER — Other Ambulatory Visit (HOSPITAL_COMMUNITY): Payer: Self-pay

## 2022-06-25 DIAGNOSIS — M533 Sacrococcygeal disorders, not elsewhere classified: Secondary | ICD-10-CM | POA: Diagnosis not present

## 2022-06-25 DIAGNOSIS — M5459 Other low back pain: Secondary | ICD-10-CM | POA: Diagnosis not present

## 2022-06-25 DIAGNOSIS — M542 Cervicalgia: Secondary | ICD-10-CM | POA: Diagnosis not present

## 2022-06-27 ENCOUNTER — Other Ambulatory Visit (HOSPITAL_COMMUNITY): Payer: Self-pay

## 2022-06-28 ENCOUNTER — Encounter: Payer: Self-pay | Admitting: Dermatology

## 2022-06-29 ENCOUNTER — Encounter: Payer: Self-pay | Admitting: Family Medicine

## 2022-06-30 ENCOUNTER — Other Ambulatory Visit (HOSPITAL_COMMUNITY): Payer: Self-pay

## 2022-07-02 DIAGNOSIS — M542 Cervicalgia: Secondary | ICD-10-CM | POA: Diagnosis not present

## 2022-07-02 DIAGNOSIS — M533 Sacrococcygeal disorders, not elsewhere classified: Secondary | ICD-10-CM | POA: Diagnosis not present

## 2022-07-02 DIAGNOSIS — M5459 Other low back pain: Secondary | ICD-10-CM | POA: Diagnosis not present

## 2022-07-08 DIAGNOSIS — M5459 Other low back pain: Secondary | ICD-10-CM | POA: Diagnosis not present

## 2022-07-08 DIAGNOSIS — M533 Sacrococcygeal disorders, not elsewhere classified: Secondary | ICD-10-CM | POA: Diagnosis not present

## 2022-07-08 DIAGNOSIS — M542 Cervicalgia: Secondary | ICD-10-CM | POA: Diagnosis not present

## 2022-07-22 ENCOUNTER — Other Ambulatory Visit (HOSPITAL_COMMUNITY): Payer: Self-pay

## 2022-07-27 ENCOUNTER — Encounter: Payer: Self-pay | Admitting: Family Medicine

## 2022-07-28 ENCOUNTER — Ambulatory Visit: Payer: Federal, State, Local not specified - PPO | Admitting: Dermatology

## 2022-08-05 ENCOUNTER — Ambulatory Visit (INDEPENDENT_AMBULATORY_CARE_PROVIDER_SITE_OTHER): Payer: Federal, State, Local not specified - PPO

## 2022-08-05 ENCOUNTER — Other Ambulatory Visit: Payer: Self-pay | Admitting: Family Medicine

## 2022-08-05 ENCOUNTER — Ambulatory Visit: Payer: Federal, State, Local not specified - PPO | Admitting: Family Medicine

## 2022-08-05 ENCOUNTER — Encounter: Payer: Self-pay | Admitting: Family Medicine

## 2022-08-05 VITALS — BP 130/70 | HR 90 | Temp 99.0°F | Ht 68.0 in | Wt 280.0 lb

## 2022-08-05 DIAGNOSIS — R059 Cough, unspecified: Secondary | ICD-10-CM | POA: Diagnosis not present

## 2022-08-05 DIAGNOSIS — R7989 Other specified abnormal findings of blood chemistry: Secondary | ICD-10-CM

## 2022-08-05 DIAGNOSIS — U071 COVID-19: Secondary | ICD-10-CM | POA: Diagnosis not present

## 2022-08-05 DIAGNOSIS — R0789 Other chest pain: Secondary | ICD-10-CM

## 2022-08-05 DIAGNOSIS — F419 Anxiety disorder, unspecified: Secondary | ICD-10-CM | POA: Diagnosis not present

## 2022-08-05 DIAGNOSIS — F32A Depression, unspecified: Secondary | ICD-10-CM

## 2022-08-05 DIAGNOSIS — I10 Essential (primary) hypertension: Secondary | ICD-10-CM | POA: Diagnosis not present

## 2022-08-05 LAB — COMPREHENSIVE METABOLIC PANEL
ALT: 66 U/L — ABNORMAL HIGH (ref 0–35)
AST: 37 U/L (ref 0–37)
Albumin: 4.5 g/dL (ref 3.5–5.2)
Alkaline Phosphatase: 103 U/L (ref 39–117)
BUN: 11 mg/dL (ref 6–23)
CO2: 22 mEq/L (ref 19–32)
Calcium: 9.1 mg/dL (ref 8.4–10.5)
Chloride: 105 mEq/L (ref 96–112)
Creatinine, Ser: 0.86 mg/dL (ref 0.40–1.20)
GFR: 81.7 mL/min (ref >60.00)
Glucose, Bld: 94 mg/dL (ref 70–99)
Potassium: 4.1 mEq/L (ref 3.5–5.1)
Sodium: 136 mEq/L (ref 135–145)
Total Bilirubin: 0.5 mg/dL (ref 0.2–1.2)
Total Protein: 7.2 g/dL (ref 6.0–8.3)

## 2022-08-05 LAB — CBC WITH DIFFERENTIAL/PLATELET
Basophils Absolute: 0.1 10*3/uL (ref 0.0–0.1)
Basophils Relative: 0.8 % (ref 0.0–3.0)
Eosinophils Absolute: 0.3 10*3/uL (ref 0.0–0.7)
Eosinophils Relative: 3.2 % (ref 0.0–5.0)
HCT: 40.7 % (ref 36.0–46.0)
Hemoglobin: 13.7 g/dL (ref 12.0–15.0)
Lymphocytes Relative: 23.4 % (ref 12.0–46.0)
Lymphs Abs: 2.5 10*3/uL (ref 0.7–4.0)
MCHC: 33.6 g/dL (ref 30.0–36.0)
MCV: 95.4 fl (ref 78.0–100.0)
Monocytes Absolute: 0.6 10*3/uL (ref 0.1–1.0)
Monocytes Relative: 5.9 % (ref 3.0–12.0)
Neutro Abs: 7 10*3/uL (ref 1.4–7.7)
Neutrophils Relative %: 66.7 % (ref 43.0–77.0)
Platelets: 335 10*3/uL (ref 150.0–400.0)
RBC: 4.27 Mil/uL (ref 3.87–5.11)
RDW: 13.6 % (ref 11.5–15.5)
WBC: 10.5 10*3/uL (ref 4.0–10.5)

## 2022-08-05 LAB — BRAIN NATRIURETIC PEPTIDE: Pro B Natriuretic peptide (BNP): 11 pg/mL (ref 0.0–100.0)

## 2022-08-05 MED ORDER — SERTRALINE HCL 50 MG PO TABS: 50.0000 mg | ORAL_TABLET | Freq: Every day | ORAL | 3 refills | Status: DC

## 2022-08-05 MED ORDER — HYDROXYZINE HCL 10 MG PO TABS: 10.0000 mg | ORAL_TABLET | Freq: Three times a day (TID) | ORAL | 0 refills | Status: DC | PRN

## 2022-08-05 MED ORDER — PREDNISONE 20 MG PO TABS: 40.0000 mg | ORAL_TABLET | Freq: Every day | ORAL | 0 refills | Status: DC

## 2022-08-05 NOTE — Patient Instructions (Addendum)
Nice to see you. We will get lab work and an x-ray today. We will start you on Zoloft.  You can use hydroxyzine for any acute anxiety symptoms.  If this makes you excessively drowsy please let us know. If you develop cough productive of blood, shortness of breath, worsening chest issues or any new symptoms please seek medical attention again.

## 2022-08-05 NOTE — Progress Notes (Signed)
Bridget Rumps, MD Phone: [phone]  Bridget Gould is a 46 y.o. female who presents today for same-day visit.  COVID-19: Patient had COVID-19 with symptoms starting on 07/21/2022.  Tested positive on 07/25/2022.  She has had cough with some wheezing and some heaviness in her chest.  The heaviness is mostly when she lays down and does improve when she sits up.  Notes all of the symptoms have been going on since onset of her symptoms.  She feels congested in her chest.  She does still have some sinus congestion.  No exertional chest discomfort.  No shortness of breath.  Anxiety/depression: Patient previously tapered off of Effexor given elevated blood pressure issues.  She notes after tapering all the way off of this over a month or so she then developed increased issues with anxiety and depression.  She has been irritable and has less patience.  She has loss of energy.  She also notes during the timeframe of tapering off of the Effexor her husband had another heart attack.  She notes no SI.  Hypertension: Blood pressures have been in the 130s-low 150s.  She has been on amlodipine 5 mg once daily.  She has a rash on her left forearm that she wonders if this is related to her medications.  No leg swelling.  Social History   Tobacco Use  Smoking Status Never  Smokeless Tobacco Never    Current Outpatient Medications on File Prior to Visit  Medication Sig Dispense Refill   amLODipine (NORVASC) 5 MG tablet Take 1 tablet (5 mg total) by mouth daily. 90 tablet 1   clobetasol cream (TEMOVATE) 0.05 % Apply thin layer to aa's at left posterior shoulder bid for 2wks. If rash still persist can use qd after 2 weeks. Avoid applying to face, groin, and axilla. Use as directed. 30 g 0   cyclobenzaprine (FLEXERIL) 10 MG tablet Take 10 mg by mouth 3 (three) times daily as needed for muscle spasms.     gabapentin (NEURONTIN) 100 MG capsule Take 100 capsules by mouth daily.     hydrocortisone 2.5 % cream  Apply twice daily to affected areas up 2 weeks PRN irritation 28 g 3   ketoconazole (NIZORAL) 2 % cream Apply twice daily as needed for itching 30 g 3   norethindrone (MICRONOR) 0.35 MG tablet Take 1 tablet (0.35 mg total) by mouth daily. 84 tablet 3   Semaglutide-Weight Management (WEGOVY) 2.4 MG/0.75ML SOAJ Inject 2.4 mg into the skin once a week for 28 days. 3 mL 2   sulfamethoxazole-trimethoprim (BACTRIM,SEPTRA) 400-80 MG tablet Take 0.5 tablets by mouth daily.  1   No current facility-administered medications on file prior to visit.     ROS see history of present illness  Objective  Physical Exam Vitals:   08/05/22 1218  BP: 130/70  Pulse: 90  Temp: 99 F (37.2 C)  SpO2: 99%    BP Readings from Last 3 Encounters:  08/05/22 130/70  06/15/22 130/80  05/04/22 128/76   Wt Readings from Last 3 Encounters:  08/05/22 280 lb (127 kg)  06/15/22 281 lb 3.2 oz (127.6 kg)  05/04/22 286 lb (129.7 kg)    Physical Exam Constitutional:      General: She is not in acute distress.    Appearance: She is not diaphoretic.  Cardiovascular:     Rate and Rhythm: Normal rate and regular rhythm.     Heart sounds: Normal heart sounds. No murmur heard.    No friction rub.  No gallop.  Pulmonary:     Effort: Pulmonary effort is normal.     Breath sounds: Normal breath sounds.  Musculoskeletal:     Right lower leg: No edema.     Left lower leg: No edema.  Skin:    General: Skin is warm and dry.  Neurological:     Mental Status: She is alert.      Assessment/Plan: Please see individual problem list.  Problem List Items Addressed This Visit     Anxiety and depression (Chronic)    Worsened with coming off of the Effexor.  At this point she should not have any withdrawal symptoms from being off of the Effexor.  We will start Zoloft 50 mg once daily.  I will prescribe hydroxyzine 10 mg 3 times daily as needed for anxiety.  Discussed risk of drowsiness with the hydroxyzine.  She is good  to follow-up with me as scheduled.  I did discuss that it may take some time for the Zoloft to become effective.      Relevant Medications   sertraline (ZOLOFT) 50 MG tablet   hydrOXYzine (ATARAX) 10 MG tablet   Hypertension (Chronic)    Still mildly elevated at home though she has had a lot of stress with what is going on with her anxiety and depression.  For now we will continue the amlodipine.  The rash could be related to the amlodipine though we will monitor for now.  She will continue amlodipine 5 mg daily.      COVID-19 - Primary    The patient has been progressively improving though she does still have some congestion and heaviness in her chest with this.  Given those persistent symptoms we will get a chest x-ray and some lab work today.  Discussed the potential that COVID could cause heart issues though she has no exertional symptoms and does not have any other symptoms of overt heart failure.  Her exam today did not reveal any apparent fluid retention or friction rubs.  We will see what her lab work and x-ray reveals.  If those are negative for cause we will consider prednisone to help with airway irritation.  She was advised to seek medical attention for any worsening symptoms, cough productive of blood, or shortness of breath.  Heart rate and oxygenation are normal and patient has not had any unilateral leg swelling all of which would argue against PE.  Wells score was 1.5 given her prior history of PE placing her in a low risk group.      Other Visit Diagnoses     Chest heaviness       Relevant Orders   DG Chest 2 View   CBC w/Diff   Comp Met (CMET)   B Nat Peptide        Return for As scheduled.   Bridget Rumps, MD [place]

## 2022-08-05 NOTE — Assessment & Plan Note (Addendum)
The patient has been progressively improving though she does still have some congestion and heaviness in her chest with this.  Given those persistent symptoms we will get a chest x-ray and some lab work today.  Discussed the potential that COVID could cause heart issues though she has no exertional symptoms and does not have any other symptoms of overt heart failure.  Her exam today did not reveal any apparent fluid retention or friction rubs.  We will see what her lab work and x-ray reveals.  If those are negative for cause we will consider prednisone to help with airway irritation.  She was advised to seek medical attention for any worsening symptoms, cough productive of blood, or shortness of breath.  Heart rate and oxygenation are normal and patient has not had any unilateral leg swelling all of which would argue against PE.  Wells score was 1.5 given her prior history of PE placing her in a low risk group.

## 2022-08-05 NOTE — Assessment & Plan Note (Signed)
Still mildly elevated at home though she has had a lot of stress with what is going on with her anxiety and depression.  For now we will continue the amlodipine.  The rash could be related to the amlodipine though we will monitor for now.  She will continue amlodipine 5 mg daily.

## 2022-08-05 NOTE — Assessment & Plan Note (Signed)
Worsened with coming off of the Effexor.  At this point she should not have any withdrawal symptoms from being off of the Effexor.  We will start Zoloft 50 mg once daily.  I will prescribe hydroxyzine 10 mg 3 times daily as needed for anxiety.  Discussed risk of drowsiness with the hydroxyzine.  She is good to follow-up with me as scheduled.  I did discuss that it may take some time for the Zoloft to become effective.

## 2022-08-06 ENCOUNTER — Telehealth: Payer: Self-pay

## 2022-08-06 NOTE — Telephone Encounter (Signed)
Patient states she is returning Fulton Mole, CMA's call regarding her results.  I transferred call to Gae Bon.

## 2022-08-10 DIAGNOSIS — M5416 Radiculopathy, lumbar region: Secondary | ICD-10-CM | POA: Diagnosis not present

## 2022-08-18 ENCOUNTER — Other Ambulatory Visit (INDEPENDENT_AMBULATORY_CARE_PROVIDER_SITE_OTHER): Payer: Federal, State, Local not specified - PPO

## 2022-08-18 DIAGNOSIS — R7989 Other specified abnormal findings of blood chemistry: Secondary | ICD-10-CM

## 2022-08-19 ENCOUNTER — Other Ambulatory Visit: Payer: Self-pay | Admitting: Family Medicine

## 2022-08-19 LAB — HEPATIC FUNCTION PANEL
ALT: 18 U/L (ref 0–35)
AST: 14 U/L (ref 0–37)
Albumin: 4.3 g/dL (ref 3.5–5.2)
Alkaline Phosphatase: 103 U/L (ref 39–117)
Bilirubin, Direct: 0.1 mg/dL (ref 0.0–0.3)
Total Bilirubin: 0.3 mg/dL (ref 0.2–1.2)
Total Protein: 6.9 g/dL (ref 6.0–8.3)

## 2022-08-20 ENCOUNTER — Other Ambulatory Visit (HOSPITAL_COMMUNITY): Payer: Self-pay

## 2022-08-20 MED ORDER — WEGOVY 2.4 MG/0.75ML ~~LOC~~ SOAJ
2.4000 mg | SUBCUTANEOUS | 2 refills | Status: DC
  Filled 2022-08-20: qty 3, 28d supply, fill #0
  Filled 2022-09-13: qty 3, 28d supply, fill #1
  Filled 2022-10-10: qty 3, 28d supply, fill #2

## 2022-08-26 ENCOUNTER — Ambulatory Visit: Payer: Federal, State, Local not specified - PPO | Admitting: Dermatology

## 2022-08-31 DIAGNOSIS — M5416 Radiculopathy, lumbar region: Secondary | ICD-10-CM | POA: Diagnosis not present

## 2022-08-31 DIAGNOSIS — M5136 Other intervertebral disc degeneration, lumbar region: Secondary | ICD-10-CM | POA: Diagnosis not present

## 2022-09-14 ENCOUNTER — Other Ambulatory Visit (HOSPITAL_COMMUNITY): Payer: Self-pay

## 2022-09-18 ENCOUNTER — Ambulatory Visit: Payer: Federal, State, Local not specified - PPO | Admitting: Family Medicine

## 2022-10-10 ENCOUNTER — Other Ambulatory Visit (HOSPITAL_COMMUNITY): Payer: Self-pay

## 2022-10-19 ENCOUNTER — Encounter: Payer: Self-pay | Admitting: Family Medicine

## 2022-10-19 ENCOUNTER — Ambulatory Visit (INDEPENDENT_AMBULATORY_CARE_PROVIDER_SITE_OTHER): Payer: Federal, State, Local not specified - PPO | Admitting: Family Medicine

## 2022-10-19 VITALS — BP 120/70 | HR 81 | Temp 98.5°F | Ht 68.0 in | Wt 281.4 lb

## 2022-10-19 DIAGNOSIS — Z1211 Encounter for screening for malignant neoplasm of colon: Secondary | ICD-10-CM

## 2022-10-19 DIAGNOSIS — Z23 Encounter for immunization: Secondary | ICD-10-CM | POA: Diagnosis not present

## 2022-10-19 DIAGNOSIS — R239 Unspecified skin changes: Secondary | ICD-10-CM

## 2022-10-19 DIAGNOSIS — I10 Essential (primary) hypertension: Secondary | ICD-10-CM | POA: Diagnosis not present

## 2022-10-19 DIAGNOSIS — Q068 Other specified congenital malformations of spinal cord: Secondary | ICD-10-CM | POA: Diagnosis not present

## 2022-10-19 NOTE — Assessment & Plan Note (Signed)
Well-controlled today.  She will check more consistently at home.  She will continue amlodipine 5 mg daily.

## 2022-10-19 NOTE — Assessment & Plan Note (Signed)
Reports epidural has been beneficial for discomfort in her leg.  Discussed that should be okay to take Tylenol PM per packaging instructions.

## 2022-10-19 NOTE — Assessment & Plan Note (Addendum)
She will discontinue Wegovy given that she has not had adequate weight loss over the last several months.  She will continue healthy diet and exercise.  We did discuss zepbound as an option for weight loss medication.  After the patient left I noted the potential that it could interact with her Norethin drone.  I will check with our clinical pharmacist on this and then send in [place] if appropriate.

## 2022-10-19 NOTE — Progress Notes (Signed)
[name], MD Phone: [phone]  Bridget Gould is a 46 y.o. female who presents today for f/u.  HYPERTENSION Disease Monitoring Home BP Monitoring 120s-140s/70s-80s, though only checked in the past week Chest pain- no    Dyspnea- no Medications Compliance-  taking amlodipine.  Edema- no BMET    Component Value Date/Time   NA 136 08/05/2022 1228   NA 139 01/15/2014 0922   K 4.1 08/05/2022 1228   K 3.9 01/15/2014 0922   CL 105 08/05/2022 1228   CL 109 (H) 01/15/2014 0922   CO2 22 08/05/2022 1228   CO2 24 01/15/2014 0922   GLUCOSE 94 08/05/2022 1228   GLUCOSE 90 01/15/2014 0922   BUN 11 08/05/2022 1228   BUN 17 01/15/2014 0922   CREATININE 0.86 08/05/2022 1228   CREATININE 0.82 01/15/2014 0922   CALCIUM 9.1 08/05/2022 1228   CALCIUM 8.4 (L) 01/15/2014 0922   GFRNONAA >60 01/15/2014 0922   GFRAA >60 01/15/2014 6294   Obesity: Patient is on TMLYYT.  She continues to have a very healthy diet and limits carbohydrates.  She exercises 5 days a week.  She has not lost any more weight.  Lumbar radiculopathy: Patient continues to have occasional symptoms in her right leg from her back.  She notes her epidural injection did help quite a bit though she does have to take Tylenol PM occasionally to help with the pain at night and sleep.  She wonders if that is okay.  Skin discoloration: This is on her left forearm.  She notes it is still there.  This has been present for a number of months now.  It may have spread a little bit.  She has a dermatology appointment in January.  Social History   Tobacco Use  Smoking Status Never  Smokeless Tobacco Never    Current Outpatient Medications on File Prior to Visit  Medication Sig Dispense Refill   amLODipine (NORVASC) 5 MG tablet Take 1 tablet (5 mg total) by mouth daily. 90 tablet 1   clobetasol cream (TEMOVATE) 0.05 % Apply thin layer to aa's at left posterior shoulder bid for 2wks. If rash still persist can use qd after 2  weeks. Avoid applying to face, groin, and axilla. Use as directed. 30 g 0   cyclobenzaprine (FLEXERIL) 10 MG tablet Take 10 mg by mouth 3 (three) times daily as needed for muscle spasms.     gabapentin (NEURONTIN) 100 MG capsule Take 100 capsules by mouth daily.     hydrocortisone 2.5 % cream Apply twice daily to affected areas up 2 weeks PRN irritation 28 g 3   hydrOXYzine (ATARAX) 10 MG tablet Take 1 tablet (10 mg total) by mouth 3 (three) times daily as needed. 30 tablet 0   ketoconazole (NIZORAL) 2 % cream Apply twice daily as needed for itching 30 g 3   norethindrone (MICRONOR) 0.35 MG tablet Take 1 tablet (0.35 mg total) by mouth daily. 84 tablet 3   sertraline (ZOLOFT) 50 MG tablet Take 1 tablet (50 mg total) by mouth daily. 30 tablet 3   sulfamethoxazole-trimethoprim (BACTRIM,SEPTRA) 400-80 MG tablet Take 0.5 tablets by mouth daily.  1   No current facility-administered medications on file prior to visit.     ROS see history of present illness  Objective  Physical Exam Vitals:   10/19/22 1603  BP: 120/70  Pulse: 81  Temp: 98.5 F (36.9 C)  SpO2: 98%    BP Readings from Last 3 Encounters:  10/19/22 120/70  08/05/22 130/70  06/15/22 130/80   Wt Readings from Last 3 Encounters:  10/19/22 281 lb 6.4 oz (127.6 kg)  08/05/22 280 lb (127 kg)  06/15/22 281 lb 3.2 oz (127.6 kg)    Physical Exam Constitutional:      General: She is not in acute distress.    Appearance: She is not diaphoretic.  Cardiovascular:     Rate and Rhythm: Normal rate and regular rhythm.     Heart sounds: Normal heart sounds.  Pulmonary:     Effort: Pulmonary effort is normal.     Breath sounds: Normal breath sounds.  Skin:    General: Skin is warm and dry.       Neurological:     Mental Status: She is alert.      Assessment/Plan: Please see individual problem list.  Hypertension, unspecified type Assessment & Plan: Well-controlled today.  She will check more consistently at home.   She will continue amlodipine 5 mg daily.   Morbid obesity ([place]) Assessment & Plan: She will discontinue Wegovy given that she has not had adequate weight loss over the last several months.  She will continue healthy diet and exercise.  We did discuss zepbound as an option for weight loss medication.  After the patient left I noted the potential that it could interact with her Norethin drone.  I will check with our clinical pharmacist on this and then send in [place] if appropriate.   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  Discussed that should be okay to take Tylenol PM per packaging instructions.   Skin change Assessment & Plan: Advised to see her dermatologist as scheduled.   Colon cancer screening -     Ambulatory referral to Gastroenterology     Health Maintenance: Refer to GI for colon cancer screening.  Return in about 3 months (around 01/18/2023).   [name], MD [place]

## 2022-10-19 NOTE — Assessment & Plan Note (Signed)
Advised to see her dermatologist as scheduled.

## 2022-10-27 ENCOUNTER — Other Ambulatory Visit: Payer: Self-pay | Admitting: Family Medicine

## 2022-10-27 ENCOUNTER — Telehealth: Payer: Self-pay | Admitting: Family Medicine

## 2022-10-27 DIAGNOSIS — Z1231 Encounter for screening mammogram for malignant neoplasm of breast: Secondary | ICD-10-CM

## 2022-10-27 NOTE — Telephone Encounter (Signed)
-----   Message from Osker Mason, [place] sent at 10/19/2022  6:54 PM EST ----- Marykay Lex,   The package insert recommends that for patients on OCP, to use a back up method of contraception when starting Zepbound and for 4 weeks after a dose increase - so basically the entire time of titration. Once 4 weeks on a dose, the delay in gastric emptying is less pronounced so there is less impact on absorption of OCP.   Catie ----- Message ----- From: Leone Haven, MD Sent: 10/19/2022   4:23 PM EST To: Osker Mason, RPH-CPP  Hey Catie,   What do you typically advise patients regarding mounjaro and being on an OCP?  Randall Hiss

## 2022-10-27 NOTE — Telephone Encounter (Signed)
Please let the patient know that I checked with out clinical pharmacist on the zepbound taken in combination with contraceptive pills. She noted that patients should be advised to use back up contraception the entire time the patient is titrating up on dose. Given this is the patient still interested in zepbound?

## 2022-10-27 NOTE — Telephone Encounter (Signed)
LVMTCB

## 2022-10-28 NOTE — Telephone Encounter (Signed)
I called and spoke with the patient and informed her that she would need a backup contraception while titrating up and she understood and stated she is still interested in the zepbound.   Bridget Gould,cma

## 2022-11-03 ENCOUNTER — Other Ambulatory Visit: Payer: Self-pay

## 2022-11-03 ENCOUNTER — Telehealth: Payer: Self-pay

## 2022-11-03 DIAGNOSIS — Z1211 Encounter for screening for malignant neoplasm of colon: Secondary | ICD-10-CM

## 2022-11-03 MED ORDER — NA SULFATE-K SULFATE-MG SULF 17.5-3.13-1.6 GM/177ML PO SOLN: 1.0000 | Freq: Once | ORAL | 0 refills | Status: AC

## 2022-11-03 NOTE — Telephone Encounter (Signed)
Gastroenterology Pre-Procedure Review  Request Date: 11/16/22 Requesting Physician: Dr. Vicente Males  PATIENT REVIEW QUESTIONS: The patient responded to the following health history questions as indicated:    Note: Patient takes [place].  Has been advised to stop 7 days prior to colonoscopy. 1. Are you having any GI issues? no 2. Do you have a personal history of Polyps? no 3. Do you have a family history of Colon Cancer or Polyps? yes (maternal grandfather colon cancer) 4. Diabetes Mellitus? no 5. Joint replacements in the past 12 months?no 6. Major health problems in the past 3 months?no 7. Any artificial heart valves, MVP, or defibrillator?no    MEDICATIONS & ALLERGIES:    Patient reports the following regarding taking any anticoagulation/antiplatelet therapy:   Plavix, Coumadin, Eliquis, Xarelto, Lovenox, Pradaxa, Brilinta, or Effient? no Aspirin? no  Patient confirms/reports the following medications:  Current Outpatient Medications  Medication Sig Dispense Refill   amLODipine (NORVASC) 5 MG tablet Take 1 tablet (5 mg total) by mouth daily. 90 tablet 1   clobetasol cream (TEMOVATE) 0.05 % Apply thin layer to aa's at left posterior shoulder bid for 2wks. If rash still persist can use qd after 2 weeks. Avoid applying to face, groin, and axilla. Use as directed. 30 g 0   cyclobenzaprine (FLEXERIL) 10 MG tablet Take 10 mg by mouth 3 (three) times daily as needed for muscle spasms.     gabapentin (NEURONTIN) 100 MG capsule Take 100 capsules by mouth daily.     hydrocortisone 2.5 % cream Apply twice daily to affected areas up 2 weeks PRN irritation 28 g 3   hydrOXYzine (ATARAX) 10 MG tablet Take 1 tablet (10 mg total) by mouth 3 (three) times daily as needed. 30 tablet 0   ketoconazole (NIZORAL) 2 % cream Apply twice daily as needed for itching 30 g 3   norethindrone (MICRONOR) 0.35 MG tablet Take 1 tablet (0.35 mg total) by mouth daily. 84 tablet 3   sertraline (ZOLOFT) 50 MG tablet Take 1  tablet (50 mg total) by mouth daily. 30 tablet 3   sulfamethoxazole-trimethoprim (BACTRIM,SEPTRA) 400-80 MG tablet Take 0.5 tablets by mouth daily.  1   No current facility-administered medications for this visit.    Patient confirms/reports the following allergies:  Allergies  Allergen Reactions   Penicillins Rash    No orders of the defined types were placed in this encounter.   AUTHORIZATION INFORMATION Primary Insurance: 1D#: Group #:  Secondary Insurance: 1D#: Group #:  SCHEDULE INFORMATION: Date: 11/16/22 Time: Location: [facility]

## 2022-11-05 ENCOUNTER — Telehealth: Payer: Self-pay | Admitting: *Deleted

## 2022-11-05 ENCOUNTER — Other Ambulatory Visit: Payer: Self-pay | Admitting: Family Medicine

## 2022-11-05 DIAGNOSIS — I10 Essential (primary) hypertension: Secondary | ICD-10-CM

## 2022-11-05 NOTE — Telephone Encounter (Signed)
Voicemail message left for patient to return my call. Need to reschedule since Dr Vicente Males will not be here on 11/16/2022

## 2022-11-05 NOTE — Telephone Encounter (Signed)
Patient called back and stated that she can be reschedule to 11/23/2022.  Called [facility] Endo unit to make the change. New instructions will be sent.

## 2022-11-06 ENCOUNTER — Other Ambulatory Visit: Payer: Self-pay | Admitting: Family Medicine

## 2022-11-06 MED ORDER — ZEPBOUND 2.5 MG/0.5ML ~~LOC~~ SOAJ: 2.5000 mg | SUBCUTANEOUS | 0 refills | Status: DC

## 2022-11-06 MED ORDER — ZEPBOUND 5 MG/0.5ML ~~LOC~~ SOAJ: 5.0000 mg | SUBCUTANEOUS | 0 refills | Status: DC

## 2022-11-06 NOTE — Telephone Encounter (Signed)
I called and LVM for the patient to call back and I also sent in the message on mychart.  Kiana Hollar,cma

## 2022-11-06 NOTE — Addendum Note (Signed)
Addended by: Leone Haven on: 11/06/2022 04:31 PM   Modules accepted: Orders

## 2022-11-06 NOTE — Telephone Encounter (Signed)
Noted. I sent zepbound in for her. She needs to use back up contraception such as condoms the entire time she is titrating up on the dose. Once she has been on a stable dose for more than 4 weeks she can stop the back up contraception.

## 2022-11-09 NOTE — Telephone Encounter (Signed)
Called and LVM for patient to call back or check her mychart message.  Garlon Tuggle,cma

## 2022-11-10 NOTE — Telephone Encounter (Signed)
Medication not covered by insurance.

## 2022-11-11 NOTE — Telephone Encounter (Signed)
Patient answered the questions on mychart.  See mychart message.  Leslee Haueter,cma

## 2022-11-11 NOTE — Telephone Encounter (Signed)
Please let the patient know that the zepbound is not covered by her insurance. Thanks.

## 2022-11-12 ENCOUNTER — Ambulatory Visit: Payer: Federal, State, Local not specified - PPO | Admitting: Dermatology

## 2022-11-12 DIAGNOSIS — D492 Neoplasm of unspecified behavior of bone, soft tissue, and skin: Secondary | ICD-10-CM

## 2022-11-12 DIAGNOSIS — L940 Localized scleroderma [morphea]: Secondary | ICD-10-CM

## 2022-11-12 DIAGNOSIS — L905 Scar conditions and fibrosis of skin: Secondary | ICD-10-CM | POA: Diagnosis not present

## 2022-11-12 DIAGNOSIS — L900 Lichen sclerosus et atrophicus: Secondary | ICD-10-CM

## 2022-11-12 DIAGNOSIS — R21 Rash and other nonspecific skin eruption: Secondary | ICD-10-CM | POA: Diagnosis not present

## 2022-11-12 MED ORDER — ZEPBOUND 5 MG/0.5ML ~~LOC~~ SOAJ
5.0000 mg | SUBCUTANEOUS | 0 refills | Status: DC
  Filled 2022-11-12: qty 6, 84d supply, fill #0
  Filled 2022-12-29 – 2023-01-22 (×4): qty 2, 28d supply, fill #0

## 2022-11-12 MED ORDER — CALCIPOTRIENE 0.005 % EX CREA: TOPICAL_CREAM | Freq: Two times a day (BID) | CUTANEOUS | 1 refills | Status: DC

## 2022-11-12 MED ORDER — ZEPBOUND 2.5 MG/0.5ML ~~LOC~~ SOAJ
2.5000 mg | SUBCUTANEOUS | 0 refills | Status: AC
  Filled 2022-11-12 – 2022-12-29 (×2): qty 2, 28d supply, fill #0

## 2022-11-12 NOTE — Patient Instructions (Addendum)
Wound Care Instructions  Cleanse wound gently with soap and water once a day then pat dry with clean gauze. Apply a thin coat of Petrolatum (petroleum jelly, "Vaseline") over the wound (unless you have an allergy to this). We recommend that you use a new, sterile tube of Vaseline. Do not pick or remove scabs. Do not remove the yellow or white "healing tissue" from the base of the wound.  Cover the wound with fresh, clean, nonstick gauze and secure with paper tape. You may use Band-Aids in place of gauze and tape if the wound is small enough, but would recommend trimming much of the tape off as there is often too much. Sometimes Band-Aids can irritate the skin.  You should call the office for your biopsy report after 1 week if you have not already been contacted.  If you experience any problems, such as abnormal amounts of bleeding, swelling, significant bruising, significant pain, or evidence of infection, please call the office immediately.  FOR ADULT SURGERY PATIENTS: If you need something for pain relief you may take 1 extra strength Tylenol (acetaminophen) AND 2 Ibuprofen (200mg each) together every 4 hours as needed for pain. (do not take these if you are allergic to them or if you have a reason you should not take them.) Typically, you may only need pain medication for 1 to 3 days.     Due to recent changes in healthcare laws, you may see results of your pathology and/or laboratory studies on MyChart before the doctors have had a chance to review them. We understand that in some cases there may be results that are confusing or concerning to you. Please understand that not all results are received at the same time and often the doctors may need to interpret multiple results in order to provide you with the best plan of care or course of treatment. Therefore, we ask that you please give us 2 business days to thoroughly review all your results before contacting the office for clarification. Should  we see a critical lab result, you will be contacted sooner.   If You Need Anything After Your Visit  If you have any questions or concerns for your doctor, please call our main line at [phone] and press option 4 to reach your doctor's medical assistant. If no one answers, please leave a voicemail as directed and we will return your call as soon as possible. Messages left after 4 pm will be answered the following business day.   You may also send us a message via MyChart. We typically respond to MyChart messages within 1-2 business days.  For prescription refills, please ask your pharmacy to contact our office. Our fax number is [phone].  If you have an urgent issue when the clinic is closed that cannot wait until the next business day, you can page your doctor at the number below.    Please note that while we do our best to be available for urgent issues outside of office hours, we are not available 24/7.   If you have an urgent issue and are unable to reach us, you may choose to seek medical care at your doctor's office, retail clinic, urgent care center, or emergency room.  If you have a medical emergency, please immediately call 911 or go to the emergency department.  Pager Numbers  - Dr. Kowalski: [phone]  - Dr. Moye: [phone]  - Dr. Stewart: [phone]  In the event of inclement weather, please call our main line at   [phone] for an update on the status of any delays or closures.  Dermatology Medication Tips: Please keep the boxes that topical medications come in in order to help keep track of the instructions about where and how to use these. Pharmacies typically print the medication instructions only on the boxes and not directly on the medication tubes.   If your medication is too expensive, please contact our office at [phone] option 4 or send us a message through MyChart.   We are unable to tell what your co-pay for medications will be in  advance as this is different depending on your insurance coverage. However, we may be able to find a substitute medication at lower cost or fill out paperwork to get insurance to cover a needed medication.   If a prior authorization is required to get your medication covered by your insurance company, please allow us 1-2 business days to complete this process.  Drug prices often vary depending on where the prescription is filled and some pharmacies may offer cheaper prices.  The website www.goodrx.com contains coupons for medications through different pharmacies. The prices here do not account for what the cost may be with help from insurance (it may be cheaper with your insurance), but the website can give you the price if you did not use any insurance.  - You can print the associated coupon and take it with your prescription to the pharmacy.  - You may also stop by our office during regular business hours and pick up a GoodRx coupon card.  - If you need your prescription sent electronically to a different pharmacy, notify our office through [place] MyChart or by phone at [phone] option 4.     Si Usted Necesita Algo Despus de Su Visita  Tambin puede enviarnos un mensaje a travs de MyChart. Por lo general respondemos a los mensajes de MyChart en el transcurso de 1 a 2 das hbiles.  Para renovar recetas, por favor pida a su farmacia que se ponga en contacto con nuestra oficina. Nuestro nmero de fax es el [phone].  Si tiene un asunto urgente cuando la clnica est cerrada y que no puede esperar hasta el siguiente da hbil, puede llamar/localizar a su doctor(a) al nmero que aparece a continuacin.   Por favor, tenga en cuenta que aunque hacemos todo lo posible para estar disponibles para asuntos urgentes fuera del horario de oficina, no estamos disponibles las 24 horas del da, los 7 das de la semana.   Si tiene un problema urgente y no puede comunicarse con nosotros, puede  optar por buscar atencin mdica  en el consultorio de su doctor(a), en una clnica privada, en un centro de atencin urgente o en una sala de emergencias.  Si tiene una emergencia mdica, por favor llame inmediatamente al 911 o vaya a la sala de emergencias.  Nmeros de bper  - Dr. Kowalski: [phone]  - Dra. Moye: [phone]  - Dra. Stewart: [phone]  En caso de inclemencias del tiempo, por favor llame a nuestra lnea principal al [phone] para una actualizacin sobre el estado de cualquier retraso o cierre.  Consejos para la medicacin en dermatologa: Por favor, guarde las cajas en las que vienen los medicamentos de uso tpico para ayudarle a seguir las instrucciones sobre dnde y cmo usarlos. Las farmacias generalmente imprimen las instrucciones del medicamento slo en las cajas y no directamente en los tubos del medicamento.   Si su medicamento es muy caro, por favor, pngase en contacto con   nuestra oficina llamando al [phone] y presione la opcin 4 o envenos un mensaje a travs de MyChart.   No podemos decirle cul ser su copago por los medicamentos por adelantado ya que esto es diferente dependiendo de la cobertura de su seguro. Sin embargo, es posible que podamos encontrar un medicamento sustituto a menor costo o llenar un formulario para que el seguro cubra el medicamento que se considera necesario.   Si se requiere una autorizacin previa para que su compaa de seguros cubra su medicamento, por favor permtanos de 1 a 2 das hbiles para completar este proceso.  Los precios de los medicamentos varan con frecuencia dependiendo del lugar de dnde se surte la receta y alguna farmacias pueden ofrecer precios ms baratos.  El sitio web www.goodrx.com tiene cupones para medicamentos de diferentes farmacias. Los precios aqu no tienen en cuenta lo que podra costar con la ayuda del seguro (puede ser ms barato con su seguro), pero el sitio web puede darle el  precio si no utiliz ningn seguro.  - Puede imprimir el cupn correspondiente y llevarlo con su receta a la farmacia.  - Tambin puede pasar por nuestra oficina durante el horario de atencin regular y recoger una tarjeta de cupones de GoodRx.  - Si necesita que su receta se enve electrnicamente a una farmacia diferente, informe a nuestra oficina a travs de MyChart de [place] o por telfono llamando al [phone] y presione la opcin 4.  

## 2022-11-12 NOTE — Progress Notes (Signed)
   Follow-Up Visit   Subjective  Bridget Gould is a 47 y.o. female who presents for the following: Rash (At left shoulder seems bigger, looks like it has bruising around it. Patient did not notice any improvement with clobetasol, occasional itching. ) and Skin Problem (Patient has an area at left wrist that looks like a bruise, has been there since September.).  The following portions of the chart were reviewed this encounter and updated as appropriate:   Tobacco  Allergies  Meds  Problems  Med Hx  Surg Hx  Fam Hx      Review of Systems:  No other skin or systemic complaints except as noted in HPI or Assessment and Plan.  Objective  Well appearing patient in no apparent distress; mood and affect are within normal limits.  A focused examination was performed including shoulders, back, arms. Relevant physical exam findings are noted in the Assessment and Plan.  left upper back, left wrist Hyperpigmented atrophic patch at left wrist Upper back with white scaly thin plaque within atrophic patch     Left Upper Back Upper back with white scaly thin plaque within atrophic patch       Assessment & Plan  Rash left upper back, left wrist  Ddx morphea, lichen sclerosus, other  Start Opzelura twice daily to affected areas. Samples given to patient to use twice daily. Start calcipotriene twice daily to affected areas.  Follow with clobetasol. Avoid applying to face, groin, and axilla. Use as directed. Long-term use can cause thinning of the skin.  Topical steroids (such as triamcinolone, fluocinolone, fluocinonide, mometasone, clobetasol, halobetasol, betamethasone, hydrocortisone) can cause thinning and lightening of the skin if they are used for too long in the same area. Your physician has selected the right strength medicine for your problem and area affected on the body. Please use your medication only as directed by your physician to prevent side effects.     calcipotriene (DOVONOX) 0.005 % cream - left upper back, left wrist Apply topically 2 (two) times daily.  Scar conditions and fibrosis of skin Left Upper Back  Neoplasm of skin Left Upper Back  Skin / nail biopsy Type of biopsy: punch   Informed consent: discussed and consent obtained   Timeout: patient name, date of birth, surgical site, and procedure verified   Procedure prep:  Patient was prepped and draped in usual sterile fashion Prep type:  Isopropyl alcohol Anesthesia: the lesion was anesthetized in a standard fashion   Anesthetic:  1% lidocaine w/ epinephrine 1-100,000 buffered w/ 8.4% NaHCO3 Punch size:  4 mm Suture size:  4-0 Suture type: nylon   Suture removal (days):  14 Hemostasis achieved with: suture and aluminum chloride   Outcome: patient tolerated procedure well   Post-procedure details: wound care instructions given   Additional details:  Petrolatum and a pressure dressing were applied  Specimen 2 - Surgical pathology Differential Diagnosis: r/o LS et A vs morphea vs other  Check Margins: No Upper back with white scaly thin plaque within atrophic patch   Return in about 2 weeks (around 11/26/2022) for Suture Removal, TBSE.  Graciella Belton, RMA, am acting as scribe for Forest Gleason, MD .  Documentation: I have reviewed the above documentation for accuracy and completeness, and I agree with the above.  Forest Gleason, MD

## 2022-11-13 ENCOUNTER — Other Ambulatory Visit (HOSPITAL_COMMUNITY): Payer: Self-pay

## 2022-11-13 ENCOUNTER — Other Ambulatory Visit: Payer: Self-pay | Admitting: Dermatology

## 2022-11-13 DIAGNOSIS — L24A2 Irritant contact dermatitis due to fecal, urinary or dual incontinence: Secondary | ICD-10-CM

## 2022-11-16 NOTE — Telephone Encounter (Signed)
I called the patient and LVM for her to call back and I sent her a mychart message as well  Phil Corti,cma

## 2022-11-17 ENCOUNTER — Telehealth: Payer: Self-pay

## 2022-11-17 ENCOUNTER — Other Ambulatory Visit (HOSPITAL_COMMUNITY): Payer: Self-pay

## 2022-11-17 NOTE — Telephone Encounter (Signed)
Called patient. N/A. LMOVM 

## 2022-11-17 NOTE — Telephone Encounter (Signed)
We would be happy to help start that appeal, do you have the originial CMM key or denial letter?

## 2022-11-17 NOTE — Telephone Encounter (Signed)
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 Thank you!

## 2022-11-17 NOTE — Telephone Encounter (Signed)
Patient would like an appeal process started on the Zepbound please, her medication was denied. Ran Tullis,cma

## 2022-11-18 ENCOUNTER — Telehealth: Payer: Self-pay

## 2022-11-18 NOTE — Telephone Encounter (Signed)
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 Thank you!

## 2022-11-18 NOTE — Telephone Encounter (Signed)
Patient advised of BX results .aw 

## 2022-11-19 ENCOUNTER — Other Ambulatory Visit: Payer: Self-pay | Admitting: Obstetrics and Gynecology

## 2022-11-19 DIAGNOSIS — Z3041 Encounter for surveillance of contraceptive pills: Secondary | ICD-10-CM

## 2022-11-20 ENCOUNTER — Other Ambulatory Visit: Payer: Self-pay | Admitting: Family Medicine

## 2022-11-20 DIAGNOSIS — F32A Depression, unspecified: Secondary | ICD-10-CM

## 2022-11-23 ENCOUNTER — Encounter: Payer: Self-pay | Admitting: Gastroenterology

## 2022-11-23 ENCOUNTER — Encounter
Admission: RE | Disposition: A | Discharge status: Home / Self Care | Payer: Self-pay | Source: Home / Self Care | Attending: Gastroenterology

## 2022-11-23 ENCOUNTER — Ambulatory Visit
Admission: RE | Admit: 2022-11-23 | Discharge: 2022-11-23 | Disposition: A | Discharge status: Home / Self Care | Payer: Federal, State, Local not specified - PPO | Attending: Gastroenterology | Admitting: Gastroenterology

## 2022-11-23 ENCOUNTER — Encounter: Payer: Self-pay | Admitting: Dermatology

## 2022-11-23 ENCOUNTER — Ambulatory Visit: Payer: Federal, State, Local not specified - PPO | Admitting: Anesthesiology

## 2022-11-23 DIAGNOSIS — Z1211 Encounter for screening for malignant neoplasm of colon: Secondary | ICD-10-CM

## 2022-11-23 DIAGNOSIS — F418 Other specified anxiety disorders: Secondary | ICD-10-CM | POA: Diagnosis not present

## 2022-11-23 DIAGNOSIS — D128 Benign neoplasm of rectum: Secondary | ICD-10-CM | POA: Diagnosis not present

## 2022-11-23 DIAGNOSIS — Z79899 Other long term (current) drug therapy: Secondary | ICD-10-CM | POA: Insufficient documentation

## 2022-11-23 DIAGNOSIS — K621 Rectal polyp: Secondary | ICD-10-CM | POA: Diagnosis not present

## 2022-11-23 DIAGNOSIS — Z6841 Body Mass Index (BMI) 40.0 and over, adult: Secondary | ICD-10-CM | POA: Diagnosis not present

## 2022-11-23 DIAGNOSIS — K635 Polyp of colon: Secondary | ICD-10-CM | POA: Diagnosis not present

## 2022-11-23 DIAGNOSIS — Z86711 Personal history of pulmonary embolism: Secondary | ICD-10-CM | POA: Insufficient documentation

## 2022-11-23 DIAGNOSIS — I10 Essential (primary) hypertension: Secondary | ICD-10-CM | POA: Diagnosis not present

## 2022-11-23 DIAGNOSIS — Z8249 Family history of ischemic heart disease and other diseases of the circulatory system: Secondary | ICD-10-CM | POA: Diagnosis not present

## 2022-11-23 DIAGNOSIS — D126 Benign neoplasm of colon, unspecified: Secondary | ICD-10-CM

## 2022-11-23 HISTORY — PX: COLONOSCOPY WITH PROPOFOL: SHX5780

## 2022-11-23 SURGERY — COLONOSCOPY WITH PROPOFOL
Anesthesia: General

## 2022-11-23 MED ORDER — PROPOFOL 500 MG/50ML IV EMUL
INTRAVENOUS | Status: DC | PRN
  Administered 2022-11-23: 180 ug/kg/min via INTRAVENOUS

## 2022-11-23 MED ORDER — SODIUM CHLORIDE 0.9 % IV SOLN
INTRAVENOUS | Status: DC
  Administered 2022-11-23: 1000 mL via INTRAVENOUS

## 2022-11-23 MED ORDER — STERILE WATER FOR IRRIGATION IR SOLN
Status: DC | PRN
  Administered 2022-11-23: 150 mL

## 2022-11-23 NOTE — Transfer of Care (Signed)
Immediate Anesthesia Transfer of Care Note  Patient: Bridget Gould  Procedure(s) Performed: COLONOSCOPY WITH PROPOFOL  Patient Location: PACU  Anesthesia Type:General  Level of Consciousness: awake and alert   Airway & Oxygen Therapy: Patient Spontanous Breathing and Patient connected to nasal cannula oxygen  Post-op Assessment: Report given to RN and Post -op Vital signs reviewed and stable  Post vital signs: Reviewed and stable  Last Vitals:  Vitals Value Taken Time  BP 87/64 11/23/22 1054  Temp 36.4 C 11/23/22 1053  Pulse 91 11/23/22 1055  Resp 16 11/23/22 1055  SpO2 97 % 11/23/22 1055  Vitals shown include unvalidated device data.  Last Pain:  Vitals:   11/23/22 1053  TempSrc: Tympanic  PainSc: Asleep         Complications: No notable events documented.

## 2022-11-23 NOTE — H&P (Signed)
Bridget Bellows, MD [address], [place], [place], [place], [id number] [id number], [place], [place], [place], [address] Phone: [phone]  Fax: [phone]  Primary Care Physician:  Leone Haven, MD   Pre-Procedure History & Physical: HPI:  Bridget Gould is a 47 y.o. female is here for an colonoscopy.   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 Authorizing Provider  amLODipine (NORVASC) 5 MG tablet TAKE 1 TABLET (5 MG TOTAL) BY MOUTH DAILY. 11/05/22   Leone Haven, MD  calcipotriene (DOVONOX) 0.005 % cream Apply topically 2 (two) times daily. 11/12/22   Moye, [place], MD  clobetasol cream (TEMOVATE) 0.05 % Apply thin layer to aa's at left posterior shoulder bid for 2wks. If rash still persist can use qd after 2 weeks. Avoid applying to face, groin, and axilla. Use as directed. 03/18/22   Brendolyn Patty, MD  cyclobenzaprine (FLEXERIL) 10 MG tablet Take 10 mg by mouth 3 (three) times daily as needed for muscle spasms.    [provider]  gabapentin (NEURONTIN) 100 MG capsule Take 100 capsules by mouth daily. 02/18/22   [provider]  hydrocortisone 2.5 % cream Apply twice daily to affected areas up 2 weeks PRN irritation 06/18/22   Moye,  [place], MD  hydrOXYzine (ATARAX) 10 MG tablet Take 1 tablet (10 mg total) by mouth 3 (three) times daily as needed. 08/05/22   Leone Haven, MD  ketoconazole (NIZORAL) 2 % cream APPLY TWICE DAILY AS NEEDED FOR ITCHING 11/16/22   Moye, [place], MD  norethindrone (MICRONOR) 0.35 MG tablet Take 1 tablet (0.35 mg total) by mouth daily. 06/04/40   Copland, Elmo Putt B, PA-C  sertraline (ZOLOFT) 50 MG tablet TAKE 1 TABLET BY MOUTH EVERY DAY 11/20/22   Leone Haven, MD  sulfamethoxazole-trimethoprim (BACTRIM,SEPTRA) 400-80 MG tablet Take 0.5 tablets by mouth daily. 08/22/15   [provider]  tirzepatide (ZEPBOUND) 2.5 MG/0.5ML Pen Inject 2.5 mg into the skin once a week for 28 days. 11/12/22 12/10/22  Leone Haven, MD  tirzepatide (ZEPBOUND) 5 MG/0.5ML Pen Inject 5 mg into the skin once a week. Start after completing the 28 days of the 2.5 mg dose. 11/12/22   Leone Haven, MD    Allergies as of 11/03/2022 - Review Complete 11/03/2022  Allergen Reaction Noted   Penicillins Rash 04/04/2014    Family History  Problem Relation Age of Onset   Hyperlipidemia Mother    Arthritis Father    Hyperlipidemia Father    Heart disease Father    Hypertension Father    Cancer Maternal Grandfather        Colon/Prostate  Cancer   Diabetes Paternal Grandfather    Alcohol abuse Sister    Drug abuse Sister    Mental illness Sister        BiPolar   Cancer Maternal Aunt        Breast Cancer   Breast cancer Maternal Aunt 82    Social History   Socioeconomic History   Marital status: Married    Spouse name: Not on file   Number of children: Not on file   Years of education: Not on file   Highest education level: Not on file  Occupational History   Not on file  Tobacco Use   Smoking status: Never   Smokeless tobacco: Never  Vaping Use   Vaping Use: Never used  Substance and Sexual Activity   Alcohol use: Yes    Alcohol/week: 0.0 standard drinks of alcohol    Comment: 1 drink  every other week    Drug use: No   Sexual activity: Yes    Partners: Male    Birth control/protection: Pill    Comment: Husband   Other Topics Concern   Not on file  Social History Narrative   Married   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    No children   5 dogs   Caffeine- Coffee 1 cup daily, tea- depends, soda every other day   Social Determinants of Health   Financial Resource Strain: Low Risk  (09/16/2021)   Overall Financial Resource Strain (CARDIA)    Difficulty of Paying Living Expenses: Not hard at all  Food Insecurity: Not on file  Transportation Needs: Not on file  Physical Activity: Not on file  Stress: Not on file  Social Connections: Not on file  Intimate Partner Violence: Not on file    Review of Systems: See HPI, otherwise negative ROS  Physical Exam: There were no vitals taken for this visit. General:   Alert,  pleasant and cooperative in NAD Head:  Normocephalic and atraumatic. Neck:  Supple; no masses or thyromegaly. Lungs:  Clear throughout to auscultation, normal respiratory effort.    Heart:  +S1, +S2, Regular rate and rhythm, No edema. Abdomen:  Soft, nontender and nondistended. Normal bowel sounds, without guarding, and without rebound.   Neurologic:  Alert and  oriented x4;  grossly normal neurologically.  Impression/Plan: [name] is here for an colonoscopy to be performed for Screening colonoscopy average risk   Risks, benefits, limitations, and alternatives regarding  colonoscopy have been reviewed with the patient.  Questions have been answered.  All parties agreeable.   Bridget Bellows, MD  11/23/2022, 10:05 AM

## 2022-11-23 NOTE — Op Note (Signed)
[facility]astroenterology Patient Name: Aubrianna Orchard Procedure Date: 11/23/2022 10:01 AM MRN: [id number] Account #: [id number] Date of Birth: 26-May-1976 Admit Type: Outpatient Age: 47 Room: [facility] ENDO ROOM 1 Gender: Female Note Status: Finalized Instrument Name: Jasper Riling [id number] Procedure:             Colonoscopy Indications:           Screening for colorectal malignant neoplasm Providers:             Jonathon Bellows MD, MD Referring MD:          Angela Adam. Caryl Bis (Referring MD) Medicines:             Monitored Anesthesia Care Complications:         No immediate complications. Procedure:             Pre-Anesthesia Assessment:                        - Prior to the procedure, a History and Physical was                         performed, and patient medications, allergies and                         sensitivities were reviewed. The patient's tolerance                         of previous anesthesia was reviewed.                        - The risks and benefits of the procedure and the                         sedation options and risks were discussed with the                         patient. All questions were answered and informed                         consent was obtained.                        - ASA Grade Assessment: II - A patient with mild                         systemic disease.                        After obtaining informed consent, the colonoscope was                         passed under direct vision. Throughout the procedure,                         the patient's blood pressure, pulse, and oxygen                         saturations were monitored continuously. The                         Colonoscope was introduced  through the anus and                         advanced to the the cecum, identified by the                         appendiceal orifice. The colonoscopy was performed                         with ease. The patient tolerated the procedure well.                          The quality of the bowel preparation was excellent.                         The ileocecal valve, appendiceal orifice, and rectum                         were photographed. Findings:      The perianal and digital rectal examinations were normal.      The entire examined colon appeared normal on direct and retroflexion       views.      A 5 mm polyp was found in the rectum. The polyp was sessile. The polyp       was removed with a jumbo cold forceps. Resection and retrieval were       complete. Impression:            - The entire examined colon is normal on direct and                         retroflexion views.                        - One 5 mm polyp in the rectum, removed with a jumbo                         cold forceps. Resected and retrieved. Recommendation:        - Await pathology results.                        - Repeat colonoscopy for surveillance based on                         pathology results.                        - Discharge patient to home (with escort).                        - Resume previous diet.                        - Continue present medications. Procedure Code(s):     --- Professional ---                        [phone], Colonoscopy, flexible; with biopsy, single or                         multiple Diagnosis Code(s):     ---  Professional ---                        Z12.11, Encounter for screening for malignant neoplasm                         of colon                        D12.8, Benign neoplasm of rectum CPT copyright 2022 American Medical Association. All rights reserved. The codes documented in this report are preliminary and upon coder review may  be revised to meet current compliance requirements. Jonathon Bellows, MD Jonathon Bellows MD, MD 11/23/2022 10:50:36 AM This report has been signed electronically. Number of Addenda: 0 Note Initiated On: 11/23/2022 10:01 AM Scope Withdrawal Time: 0 hours 10 minutes 7 seconds  Total Procedure Duration:  0 hours 13 minutes 25 seconds  Estimated Blood Loss:  Estimated blood loss: none.      [facility]

## 2022-11-23 NOTE — Anesthesia Preprocedure Evaluation (Signed)
Anesthesia Evaluation  Patient identified by MRN, date of birth, ID band Patient awake    Reviewed: Allergy & Precautions, NPO status , Patient's Chart, lab work & pertinent test results  Airway Mallampati: II  TM Distance: >3 FB Neck ROM: full    Dental  (+) Teeth Intact   Pulmonary neg pulmonary ROS HX of PE   Pulmonary exam normal breath sounds clear to auscultation       Cardiovascular Exercise Tolerance: Good hypertension, Pt. on medications negative cardio ROS Normal cardiovascular exam Rhythm:Regular Rate:Normal     Neuro/Psych   Anxiety Depression    negative neurological ROS  negative psych ROS   GI/Hepatic negative GI ROS, Neg liver ROS,,,  Endo/Other  negative endocrine ROS  Morbid obesity  Renal/GU negative Renal ROS  negative genitourinary   Musculoskeletal negative musculoskeletal ROS (+)    Abdominal  (+) + obese  Peds negative pediatric ROS (+)  Hematology negative hematology ROS (+)   Anesthesia Other Findings Past Medical History: 08/21/2015: Acute pulmonary embolism ([facility]) No date: Alopecia No date: Alopecia 1978: Blood transfusion without reported diagnosis No date: H/O blood clots 10/28/2018: History of appendectomy 08/21/2015: History of benign spinal cord tumor 10/28/2018: History of cholecystectomy No date: Hyperlipidemia No date: Hypertension No date: Neurogenic bladder No date: Neurogenic bladder 08/2015: Pulmonary embolism ([place]) No date: Urinary tract infection No date: Urine incontinence  Past Surgical History: 07/2018: APPENDECTOMY 06/2018: CHOLECYSTECTOMY No date: DILATION AND CURETTAGE OF UTERUS 1978/2004: tumor     Comment:  Base of spinal cord No date: tumor removed     Reproductive/Obstetrics negative OB ROS                             Anesthesia Physical Anesthesia Plan  ASA: 3  Anesthesia Plan: General   Post-op Pain  Management:    Induction: Intravenous  PONV Risk Score and Plan: Propofol infusion and TIVA  Airway Management Planned: Natural Airway  Additional Equipment:   Intra-op Plan:   Post-operative Plan:   Informed Consent: I have reviewed the patients History and Physical, chart, labs and discussed the procedure including the risks, benefits and alternatives for the proposed anesthesia with the patient or authorized representative who has indicated his/her understanding and acceptance.     Dental Advisory Given  Plan Discussed with: CRNA and Surgeon  Anesthesia Plan Comments:        Anesthesia Quick Evaluation

## 2022-11-23 NOTE — Anesthesia Postprocedure Evaluation (Signed)
Anesthesia Post Note  Patient: Bridget Gould  Procedure(s) Performed: COLONOSCOPY WITH PROPOFOL  Patient location during evaluation: PACU Anesthesia Type: General Level of consciousness: awake Pain management: pain level controlled Vital Signs Assessment: post-procedure vital signs reviewed and stable Respiratory status: spontaneous breathing and nonlabored ventilation Cardiovascular status: stable Anesthetic complications: no  No notable events documented.   Last Vitals:  Vitals:   11/23/22 1025 11/23/22 1053  BP: 119/77 (!) 87/64  Pulse: 79 84  Resp: 20 16  Temp: (!) 36 C 36.4 C  SpO2: 100% 98%    Last Pain:  Vitals:   11/23/22 1053  TempSrc: Tympanic  PainSc: Asleep                 VAN STAVEREN,Jasimine Simms

## 2022-11-24 ENCOUNTER — Encounter: Payer: Self-pay | Admitting: Gastroenterology

## 2022-11-24 ENCOUNTER — Ambulatory Visit: Payer: Federal, State, Local not specified - PPO | Admitting: Dermatology

## 2022-11-24 DIAGNOSIS — L900 Lichen sclerosus et atrophicus: Secondary | ICD-10-CM | POA: Diagnosis not present

## 2022-11-24 DIAGNOSIS — Z1283 Encounter for screening for malignant neoplasm of skin: Secondary | ICD-10-CM

## 2022-11-24 DIAGNOSIS — L814 Other melanin hyperpigmentation: Secondary | ICD-10-CM

## 2022-11-24 DIAGNOSIS — D229 Melanocytic nevi, unspecified: Secondary | ICD-10-CM

## 2022-11-24 DIAGNOSIS — L304 Erythema intertrigo: Secondary | ICD-10-CM

## 2022-11-24 DIAGNOSIS — L940 Localized scleroderma [morphea]: Secondary | ICD-10-CM

## 2022-11-24 DIAGNOSIS — L578 Other skin changes due to chronic exposure to nonionizing radiation: Secondary | ICD-10-CM

## 2022-11-24 DIAGNOSIS — Z79899 Other long term (current) drug therapy: Secondary | ICD-10-CM

## 2022-11-24 DIAGNOSIS — L821 Other seborrheic keratosis: Secondary | ICD-10-CM

## 2022-11-24 LAB — SURGICAL PATHOLOGY

## 2022-11-24 MED ORDER — HYDROCORTISONE 2.5 % EX CREA: TOPICAL_CREAM | CUTANEOUS | 3 refills | Status: DC

## 2022-11-24 MED ORDER — CLOBETASOL PROPIONATE 0.05 % EX FOAM: CUTANEOUS | 2 refills | Status: DC

## 2022-11-24 MED ORDER — KETOCONAZOLE 2 % EX CREA: TOPICAL_CREAM | CUTANEOUS | 2 refills | Status: DC

## 2022-11-24 NOTE — Patient Instructions (Addendum)
For rash in the groin area  Start ketoconazole cream twice a day  Start HC 2.5% cream twice a day as needed apply to affected skin twice a day up to 2 weeks prn  Start otc Zeasorb AF powder  Can also consider Carpe lotion or DuraDry to help reduce sweating in the area    Recommend taking Heliocare sun protection supplement daily in sunny weather for additional sun protection. For maximum protection on the sunniest days, you can take up to 2 capsules of regular Heliocare OR take 1 capsule of Heliocare Ultra. For prolonged exposure (such as a full day in the sun), you can repeat your dose of the supplement 4 hours after your first dose. Heliocare can be purchased at [employer], at some [facility] or at [email or web address].      Melanoma ABCDEs  Melanoma is the most dangerous type of skin cancer, and is the leading cause of death from skin disease.  You are more likely to develop melanoma if you: Have light-colored skin, light-colored eyes, or red or blond hair Spend a lot of time in the sun Tan regularly, either outdoors or in a tanning bed Have had blistering sunburns, especially during childhood Have a close family member who has had a melanoma Have atypical moles or large birthmarks  Early detection of melanoma is key since treatment is typically straightforward and cure rates are extremely high if we catch it early.   The first sign of melanoma is often a change in a mole or a new dark spot.  The ABCDE system is a way of remembering the signs of melanoma.  A for asymmetry:  The two halves do not match. B for border:  The edges of the growth are irregular. C for color:  A mixture of colors are present instead of an even brown color. D for diameter:  Melanomas are usually (but not always) greater than 58m - the size of a pencil eraser. E for evolution:  The spot keeps changing in size, shape, and color.  Please check your skin once per month between visits. You can use a small  mirror in front and a large mirror behind you to keep an eye on the back side or your body.   If you see any new or changing lesions before your next follow-up, please call to schedule a visit.  Please continue daily skin protection including broad spectrum sunscreen SPF 30+ to sun-exposed areas, reapplying every 2 hours as needed when you're outdoors.      Due to recent changes in healthcare laws, you may see results of your pathology and/or laboratory studies on MyChart before the doctors have had a chance to review them. We understand that in some cases there may be results that are confusing or concerning to you. Please understand that not all results are received at the same time and often the doctors may need to interpret multiple results in order to provide you with the best plan of care or course of treatment. Therefore, we ask that you please give u[place]2 business days to thoroughly review all your results before contacting the office for clarification. Should we see a critical lab result, you will be contacted sooner.   If You Need Anything After Your Visit  If you have any questions or concerns for your doctor, please call our main line at [phone] press option 4 to reach your doctor's medical assistant. If no one answers, please leave a voicemail as directed and  we will return your call as soon as possible. Messages left after 4 pm will be answered the following business day.   You may also send [place] a message via [place]. We typically respond to MyChart messages within 1-2 business days.  For prescription refills, please ask your pharmacy to contact our office. Our fax number is [phone].  If you have an urgent issue when the clinic is closed that cannot wait until the next business day, you can page your doctor at the number below.    Please note that while we do our best to be available for urgent issues outside of office hours, we are not available 24/7.   If you have an  urgent issue and are unable to reach [place], you may choose to seek medical care at your doctor's office, retail clinic, urgent care center, or emergency room.  If you have a medical emergency, please immediately call 911 or go to the emergency department.  Pager Numbers  - Dr. Nehemiah Massed: [phone]  - Dr. Laurence Ferrari: [phone]  - Dr. Nicole Kindred: [phone]  In the event of inclement weather, please call our main line at [phone] for an update on the status of any delays or closures.  Dermatology Medication Tips: Please keep the boxes that topical medications come in in order to help keep track of the instructions about where and how to use these. Pharmacies typically print the medication instructions only on the boxes and not directly on the medication tubes.   If your medication is too expensive, please contact our office at [phone] option 4 or send [place] a message through [place].   We are unable to tell what your co-pay for medications will be in advance as this is different depending on your insurance coverage. However, we may be able to find a substitute medication at lower cost or fill out paperwork to get insurance to cover a needed medication.   If a prior authorization is required to get your medication covered by your insurance company, please allow [place] 1-2 business days to complete this process.  Drug prices often vary depending on where the prescription is filled and some pharmacies may offer cheaper prices.  The website www.goodrx.com contains coupons for medications through different pharmacies. The prices here do not account for what the cost may be with help from insurance (it may be cheaper with your insurance), but the website can give you the price if you did not use any insurance.  - You can print the associated coupon and take it with your prescription to the pharmacy.  - You may also stop by our office during regular business hours and pick up a GoodRx coupon card.  -  If you need your prescription sent electronically to a different pharmacy, notify our office through [facility] or by phone at [phone] option 4.     Si Usted Necesita Algo Despus de Su Visita  Tambin puede enviarnos un mensaje a travs de Pharmacist, community. Por lo general respondemos a los mensajes de MyChart en el transcurso de 1 a 2 das hbiles.  Para renovar recetas, por favor pida a su farmacia que se ponga en contacto con nuestra oficina. Harland Dingwall de fax es [place] [phone].  Si tiene un asunto urgente cuando la clnica est cerrada y que no puede esperar hasta el siguiente da hbil, puede llamar/localizar a su doctor(a) al nmero que aparece a continuacin.   Por favor, tenga en cuenta que aunque hacemos todo lo posible para estar disponibles  para asuntos urgentes fuera del horario de [place], no estamos disponibles las 24 horas del da, los 7 das de la [place].   Si tiene un problema urgente y no puede comunicarse con nosotros, puede optar por buscar atencin mdica  en el consultorio de su doctor(a), en una clnica privada, en un centro de atencin urgente o en una sala de emergencias.  Si tiene Engineering geologist, por favor llame inmediatamente al 911 o vaya a la sala de emergencias.  Nmeros de bper  - Dr. Nehemiah Massed: [phone]  - Dra. Moye: [phone]  - Dra. Nicole Kindred: [phone]  En caso de inclemencias del [place], por favor llame a Johnsie Kindred principal al [phone] para una actualizacin sobre el [place] de cualquier retraso o cierre.  Consejos para la medicacin en dermatologa: Por favor, guarde las cajas en las que vienen los medicamentos de uso tpico para ayudarle a seguir las instrucciones sobre dnde y cmo usarlos. Las farmacias generalmente imprimen las instrucciones del medicamento slo en las cajas y no directamente en los tubos del [place].   Si su medicamento es muy caro, por favor, pngase en contacto con Zigmund Daniel  llamando al [phone] y presione la opcin 4 o envenos un mensaje a travs de Pharmacist, community.   No podemos decirle cul ser su copago por los medicamentos por adelantado ya que esto es diferente dependiendo de la cobertura de su seguro. Sin embargo, es posible que podamos encontrar un medicamento sustituto a Electrical engineer un formulario para que el seguro cubra el medicamento que se considera necesario.   Si se requiere una autorizacin previa para que su compaa de seguros [place] su medicamento, por favor permtanos de 1 a 2 das hbiles para completar este proceso.  Los precios de los medicamentos varan con frecuencia dependiendo del Environmental consultant de dnde se surte la receta y alguna farmacias pueden ofrecer precios ms baratos.  El sitio web www.goodrx.com tiene cupones para medicamentos de Airline pilot. Los precios aqu no tienen en cuenta lo que podra costar con la ayuda del seguro (puede ser ms barato con su seguro), pero el sitio web puede darle el precio si no utiliz Research scientist (physical sciences).  - Puede imprimir el cupn correspondiente y llevarlo con su receta a la farmacia.  - Tambin puede pasar por nuestra oficina durante el horario de atencin regular y Charity fundraiser una tarjeta de cupones de GoodRx.  - Si necesita que su receta se enve electrnicamente a una farmacia diferente, informe a nuestra oficina a travs de MyChart de [place] o por telfono llamando al [phone] y presione la opcin 4.

## 2022-11-24 NOTE — Progress Notes (Signed)
Follow-Up Visit   Subjective  Bridget Gould is a 47 y.o. female who presents for the following: Annual Exam (The patient presents for Total-Body Skin Exam (TBSE) for skin cancer screening and mole check.  The patient has spots, moles and lesions to be evaluated, some may be new or changing and the patient has concerns that these could be cancer. ) and Follow-up (Suture removal at the left upper back biopsy proven LICHEN SCLEROSUS OVERLYING MORPHEA, patient using Opzelura and Calcipotriene cream with a fair response. /).   The following portions of the chart were reviewed this encounter and updated as appropriate:   Tobacco  Allergies  Meds  Problems  Med Hx  Surg Hx  Fam Hx     Review of Systems:  No other skin or systemic complaints except as noted in HPI or Assessment and Plan.  Objective  Well appearing patient in no apparent distress; mood and affect are within normal limits.  A full examination was performed including scalp, head, eyes, ears, nose, lips, neck, chest, axillae, abdomen, back, buttocks, bilateral upper extremities, bilateral lower extremities, hands, feet, fingers, toes, fingernails, and toenails. All findings within normal limits unless otherwise noted below.  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 Condition is bothersome/symptomatic for patient. Currently flared.  Some progression since photos 2 weeks ago  Encounter for Removal of Sutures - Incision site at the left upper back is clean, dry and intact - Wound cleansed, sutures removed, wound  cleansed. - Discussed pathology results showing Lichen Sclerosus overlying Morphea   - Scars remodel for a full year. - patient can apply over-the-counter silicone scar cream each night to help with scar remodeling if desired. - Patient advised to call with any concerns or if they notice any new or changing lesions.   Start Clobetasol foam apply to affected skin twice a day, may stop when skin is no longer itching, Avoid applying to face, groin, and axilla. Use as directed. Long-term use can cause thinning of the skin.  Continue Opzelura cream apply to affected skin twice a day Continue Calcipotriene cream apply to affected skin twice a day  Topical steroids (such as triamcinolone, fluocinolone, fluocinonide, mometasone, clobetasol, halobetasol, betamethasone, hydrocortisone) can cause thinning and lightening of the skin if they are used for too long in the same area. Your physician has selected the right strength medicine for your problem and area affected on the body. Please use your medication only as directed by your physician to prevent side effects.     Discussed Hydroxychloroquine (plaquenil), patient declines today she would like to continue topical treatment for several more weeks to see if she gets improvement on topicals only   May consider Hydroxychloroquine in the future  Hydroxychloroquine (plaquenil) can rarely cause irreversible damage to the retina of the eye if used for a long period of time. This damage can be identified and the medication stopped before vision changes are noticed by going for a yearly ophthalmology exam. Rarely, this medicine can cause low blood counts, liver inflammation, and/or a color change of the skin.  The use of Plaquenil  requires long term medication management, including periodic office visits and monitoring of blood work.   Related Medications clobetasol (OLUX) 0.05 % topical foam Apply to affected skin twice a day prn itch, Avoid applying to face,  groin, and axilla. Use as directed. Long-term use can cause thinning of the skin.  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 Condition is symptomatic/ bothersome to patient. Not currently at goal.  Intertrigo is a chronic recurrent rash that occurs in skin fold areas that may be associated with friction; heat; moisture; yeast; fungus; and bacteria.  It is exacerbated by increased movement / activity; sweating; and higher atmospheric temperature.   Start ketoconazole cream twice a day  Start HC 2.5% cream twice a day as needed apply to affected skin twice a day up to 2 weeks prn  Start otc Zeasorb AF powder  Can also consider Carpe lotion or DuraDry to help reduce sweating in the area  Topical steroids (such as triamcinolone, fluocinolone, fluocinonide, mometasone, clobetasol, halobetasol, betamethasone, hydrocortisone) can cause thinning and lightening of the skin if they are used for too long in the same area. Your physician has selected the right strength medicine for your problem and area affected on the body. Please use your medication only as directed by your physician to prevent side effects.    Related Medications hydrocortisone 2.5 % cream Apply twice a day as needed apply to affected skin  up to 2 weeks prn  ketoconazole (NIZORAL) 2 % cream Apply to affected skin twice a day  Morphea Neck - Posterior  Biopsy results discussed showing LSetA and Morphea  Chronic and persistent condition with duration or expected duration over one year. Condition is bothersome/symptomatic for patient. Currently flared.  Some progression since photos 2 weeks ago  Encounter for Removal of Sutures - Incision site at the left upper back is clean, dry and intact - Wound cleansed, sutures removed, wound cleansed. - Discussed pathology results showing Lichen Sclerosus overlying Morphea   - Scars remodel for a full year. - patient  can apply over-the-counter silicone scar cream each night to help with scar remodeling if desired. - Patient advised to call with any concerns or if they notice any new or changing lesions.   Start Clobetasol foam apply to affected skin twice a day, may stop when skin is no longer itching, Avoid applying to face, groin, and axilla. Use as directed. Long-term use can cause thinning of the skin.  Continue Opzelura cream apply to affected skin twice a day Continue Calcipotriene cream apply to affected skin twice a day  Topical steroids (such as triamcinolone, fluocinolone, fluocinonide, mometasone, clobetasol, halobetasol, betamethasone, hydrocortisone) can cause thinning and lightening of the skin if they are used for too long in the same area. Your physician has selected the right strength medicine for your problem and area affected on the body. Please use your medication only as directed by your physician to prevent side effects.     Discussed Hydroxychloroquine (plaquenil), patient declines today she would like to continue topical treatment for several more weeks to see if she gets improvement on topicals only   May consider Hydroxychloroquine in the future  Hydroxychloroquine (plaquenil) can rarely cause irreversible damage to the retina of the eye if used for a long period of time. This damage can be identified and the medication stopped before vision changes are noticed by going for a yearly ophthalmology exam. Rarely, this medicine can cause low blood  counts, liver inflammation, and/or a color change of the skin.  The use of Plaquenil requires long term medication management, including periodic office visits and monitoring of blood work.    Lentigines - Scattered tan macules - Due to sun exposure - Benign-appearing, observe - Recommend daily broad spectrum sunscreen SPF 30+ to sun-exposed areas, reapply every 2 hours as needed. - Call for any changes  Seborrheic Keratoses - Stuck-on,  waxy, tan-brown papules and/or plaques  - Benign-appearing - Discussed benign etiology and prognosis. - Observe - Call for any changes  Melanocytic Nevi - Tan-brown and/or pink-flesh-colored symmetric macules and papules - Benign appearing on exam today - Observation - Call clinic for new or changing moles - Recommend daily use of broad spectrum spf 30+ sunscreen to sun-exposed areas.   Actinic Damage - chronic, secondary to cumulative UV radiation exposure/sun exposure over time - diffuse scaly erythematous macules with underlying dyspigmentation - Recommend daily broad spectrum sunscreen SPF 30+ to sun-exposed areas, reapply every 2 hours as needed.  - Recommend staying in the shade or wearing long sleeves, sun glasses (UVA+UVB protection) and wide brim hats (4-inch brim around the entire circumference of the hat). - Call for new or changing lesions.  Skin cancer screening performed today.   Return in about 4 weeks (around 6/94/8546) for Lichen sclerosus .  I, Marye Round, CMA, am acting as scribe for Forest Gleason, MD .   Documentation: I have reviewed the above documentation for accuracy and completeness, and I agree with the above.  Forest Gleason, MD

## 2022-11-27 NOTE — Telephone Encounter (Signed)
Did we do an appeal for her for the zepbound?

## 2022-11-27 NOTE — Telephone Encounter (Signed)
Checking with rx auth team now

## 2022-12-05 ENCOUNTER — Other Ambulatory Visit: Payer: Self-pay | Admitting: Obstetrics and Gynecology

## 2022-12-05 DIAGNOSIS — Z3041 Encounter for surveillance of contraceptive pills: Secondary | ICD-10-CM

## 2022-12-10 ENCOUNTER — Other Ambulatory Visit (HOSPITAL_COMMUNITY): Payer: Self-pay

## 2022-12-10 NOTE — Telephone Encounter (Signed)
An appeal was never done for this medication. I called insurance to begin appeal process and was told that that Zepbound PA was never submitted, only [facility] PA which was approved. CMM will not allow me to proceed with PA at this time, waiting on PA form to be faxed to [place] so we can initiate Zepbound PA. Sorry for the delay.

## 2022-12-10 NOTE — Telephone Encounter (Signed)
An appeal was never done for this medication. I called insurance to begin appeal process and was told that that Zepbound PA was never submitted, only [facility] PA which was approved. CMM will not allow me to proceed with PA at this time, waiting on PA form to be faxed to [place] so we can initiate Zepbound PA. Sorry for the delay.

## 2022-12-11 ENCOUNTER — Ambulatory Visit
Admission: RE | Admit: 2022-12-11 | Discharge: 2022-12-11 | Disposition: A | Discharge status: Home / Self Care | Payer: Federal, State, Local not specified - PPO | Source: Ambulatory Visit | Attending: Family Medicine | Admitting: Family Medicine

## 2022-12-11 DIAGNOSIS — Z1231 Encounter for screening mammogram for malignant neoplasm of breast: Secondary | ICD-10-CM

## 2022-12-14 NOTE — Telephone Encounter (Signed)
Noted. Can you let the patient know this? I apologize that this did not get taken care of sooner for her.

## 2022-12-29 ENCOUNTER — Other Ambulatory Visit (HOSPITAL_COMMUNITY): Payer: Self-pay

## 2022-12-30 ENCOUNTER — Ambulatory Visit: Payer: Federal, State, Local not specified - PPO | Admitting: Dermatology

## 2022-12-30 DIAGNOSIS — L900 Lichen sclerosus et atrophicus: Secondary | ICD-10-CM | POA: Diagnosis not present

## 2022-12-30 DIAGNOSIS — L940 Localized scleroderma [morphea]: Secondary | ICD-10-CM | POA: Diagnosis not present

## 2022-12-30 DIAGNOSIS — L308 Other specified dermatitis: Secondary | ICD-10-CM | POA: Diagnosis not present

## 2022-12-30 MED ORDER — OPZELURA 1.5 % EX CREA: TOPICAL_CREAM | CUTANEOUS | 2 refills | Status: DC

## 2022-12-30 NOTE — Patient Instructions (Signed)
Continue calcipotriene twice daily Continue Opzelura twice daily as needed for flares (itch, irritation, color change, any other change in skin at the area) Continue clobetasol twice daily for shortest needed duration (up to 2 weeks) as needed for flares. Avoid applying to face, groin, and axilla. Use as directed. Long-term use can cause thinning of the skin.  Topical steroids (such as triamcinolone, fluocinolone, fluocinonide, mometasone, clobetasol, halobetasol, betamethasone, hydrocortisone) can cause thinning and lightening of the skin if they are used for too long in the same area. Your physician has selected the right strength medicine for your problem and area affected on the body. Please use your medication only as directed by your physician to prevent side effects.    Opzelura at no more then 20% body surface area.  Due to recent changes in healthcare laws, you may see results of your pathology and/or laboratory studies on MyChart before the doctors have had a chance to review them. We understand that in some cases there may be results that are confusing or concerning to you. Please understand that not all results are received at the same time and often the doctors may need to interpret multiple results in order to provide you with the best plan of care or course of treatment. Therefore, we ask that you please give [place] 2 business days to thoroughly review all your results before contacting the office for clarification. Should we see a critical lab result, you will be contacted sooner.   If You Need Anything After Your Visit  If you have any questions or concerns for your doctor, please call our main line at [phone] and press option 4 to reach your doctor's medical assistant. If no one answers, please leave a voicemail as directed and we will return your call as soon as possible. Messages left after 4 pm will be answered the following business day.   You may also send [place] a message via  [place]. We typically respond to MyChart messages within 1-2 business days.  For prescription refills, please ask your pharmacy to contact our office. Our fax number is [phone].  If you have an urgent issue when the clinic is closed that cannot wait until the next business day, you can page your doctor at the number below.    Please note that while we do our best to be available for urgent issues outside of office hours, we are not available 24/7.   If you have an urgent issue and are unable to reach [place], you may choose to seek medical care at your doctor's office, retail clinic, urgent care center, or emergency room.  If you have a medical emergency, please immediately call 911 or go to the emergency department.  Pager Numbers  - Dr. Nehemiah Massed: [phone]  - Dr. Laurence Ferrari: [phone]  - Dr. Nicole Kindred: [phone]  In the event of inclement weather, please call our main line at [phone] for an update on the status of any delays or closures.  Dermatology Medication Tips: Please keep the boxes that topical medications come in in order to help keep track of the instructions about where and how to use these. Pharmacies typically print the medication instructions only on the boxes and not directly on the medication tubes.   If your medication is too expensive, please contact our office at [phone] option 4 or send [place] a message through [place].   We are unable to tell what your co-pay for medications will be in advance as this is different depending on your  insurance coverage. However, we may be able to find a substitute medication at lower cost or fill out paperwork to get insurance to cover a needed medication.   If a prior authorization is required to get your medication covered by your insurance company, please allow [place] 1-2 business days to complete this process.  Drug prices often vary depending on where the prescription is filled and some pharmacies may offer cheaper  prices.  The website www.goodrx.com contains coupons for medications through different pharmacies. The prices here do not account for what the cost may be with help from insurance (it may be cheaper with your insurance), but the website can give you the price if you did not use any insurance.  - You can print the associated coupon and take it with your prescription to the pharmacy.  - You may also stop by our office during regular business hours and pick up a GoodRx coupon card.  - If you need your prescription sent electronically to a different pharmacy, notify our office through [facility] or by phone at [phone] option 4.     Si Usted Necesita Algo Despus de Su Visita  Tambin puede enviarnos un mensaje a travs de Pharmacist, community. Por lo general respondemos a los mensajes de MyChart en el transcurso de 1 a 2 das hbiles.  Para renovar recetas, por favor pida a su farmacia que se ponga en contacto con nuestra oficina. Harland Dingwall de fax es [place] [phone].  Si tiene un asunto urgente cuando la clnica est cerrada y que no puede esperar hasta el siguiente da hbil, puede llamar/localizar a su doctor(a) al nmero que aparece a continuacin.   Por favor, tenga en cuenta que aunque hacemos todo lo posible para estar disponibles para asuntos urgentes fuera del horario de [place], no estamos disponibles las 24 horas del da, los 7 das de la [place].   Si tiene un problema urgente y no puede comunicarse con nosotros, puede optar por buscar atencin mdica  en el consultorio de su doctor(a), en una clnica privada, en un centro de atencin urgente o en una sala de emergencias.  Si tiene Engineering geologist, por favor llame inmediatamente al 911 o vaya a la sala de emergencias.  Nmeros de bper  - Dr. Nehemiah Massed: [phone]  - Dra. Moye: [phone]  - Dra. Nicole Kindred: [phone]  En caso de inclemencias del [place], por favor llame a Johnsie Kindred principal al [phone]  para una actualizacin sobre el [place] de cualquier retraso o cierre.  Consejos para la medicacin en dermatologa: Por favor, guarde las cajas en las que vienen los medicamentos de uso tpico para ayudarle a seguir las instrucciones sobre dnde y cmo usarlos. Las farmacias generalmente imprimen las instrucciones del medicamento slo en las cajas y no directamente en los tubos del [place].   Si su medicamento es muy caro, por favor, pngase en contacto con Zigmund Daniel llamando al [phone] y presione la opcin 4 o envenos un mensaje a travs de Pharmacist, community.   No podemos decirle cul ser su copago por los medicamentos por adelantado ya que esto es diferente dependiendo de la cobertura de su seguro. Sin embargo, es posible que podamos encontrar un medicamento sustituto a Electrical engineer un formulario para que el seguro cubra el medicamento que se considera necesario.   Si se requiere una autorizacin previa para que su compaa de seguros [place] su medicamento, por favor permtanos de 1 a 2 das hbiles para completar este proceso.  Los  precios de los medicamentos varan con frecuencia dependiendo del lugar de dnde se surte la receta y alguna farmacias pueden ofrecer precios ms baratos.  El sitio web www.goodrx.com tiene cupones para medicamentos de Airline pilot. Los precios aqu no tienen en cuenta lo que podra costar con la ayuda del seguro (puede ser ms barato con su seguro), pero el sitio web puede darle el precio si no utiliz Research scientist (physical sciences).  - Puede imprimir el cupn correspondiente y llevarlo con su receta a la farmacia.  - Tambin puede pasar por nuestra oficina durante el horario de atencin regular y Charity fundraiser una tarjeta de cupones de GoodRx.  - Si necesita que su receta se enve electrnicamente a una farmacia diferente, informe a nuestra oficina a travs de MyChart de [place] o por telfono llamando al [phone] y presione la opcin 4.

## 2022-12-30 NOTE — Progress Notes (Signed)
   Follow-Up Visit   Subjective  Bridget Gould is a 47 y.o. female who presents for the following: Follow-up (Patient here today for lichen sclerosus and morphea follow up. Patient currently using clobetasol, Opzelura and calcipotriene twice daily. ).  Patient is doing well and feels that areas of concern are improved.   The following portions of the chart were reviewed this encounter and updated as appropriate:   Tobacco  Allergies  Meds  Problems  Med Hx  Surg Hx  Fam Hx      Review of Systems:  No other skin or systemic complaints except as noted in HPI or Assessment and Plan.  Objective  Well appearing patient in no apparent distress; mood and affect are within normal limits.  A focused examination was performed including back, arms, face, scalp. Relevant physical exam findings are noted in the Assessment and Plan.  upper back, left forearm        neck Clear today    Assessment & Plan  Lichen sclerosus et atrophicus upper back, left forearm  With morphea  Chronic condition with duration or expected duration over one year. Currently well-controlled.  Continue calcipotriene twice daily Continue Opzelura twice daily as needed for flares (itch, irritation, color change, any other change in skin at the area) Continue clobetasol twice daily for shortest needed duration (up to 2 weeks) as needed for flares. Avoid applying to face, groin, and axilla. Use as directed. Long-term use can cause thinning of the skin.  Topical steroids (such as triamcinolone, fluocinolone, fluocinonide, mometasone, clobetasol, halobetasol, betamethasone, hydrocortisone) can cause thinning and lightening of the skin if they are used for too long in the same area. Your physician has selected the right strength medicine for your problem and area affected on the body. Please use your medication only as directed by your physician to prevent side effects.    Related Medications clobetasol  (OLUX) 0.05 % topical foam Apply to affected skin twice a day prn itch, Avoid applying to face, groin, and axilla. Use as directed. Long-term use can cause thinning of the skin.  Morphea upper back, right forearm  Chronic condition with duration or expected duration over one year. Currently well-controlled.  Continue calcipotriene twice daily Continue Opzelura twice daily as needed for flares (itch, irritation, color change, any other change in skin at the area) Continue clobetasol twice daily for shortest needed duration (up to 2 weeks) as needed for flares. Avoid applying to face, groin, and axilla. Use as directed. Long-term use can cause thinning of the skin.  Topical steroids (such as triamcinolone, fluocinolone, fluocinonide, mometasone, clobetasol, halobetasol, betamethasone, hydrocortisone) can cause thinning and lightening of the skin if they are used for too long in the same area. Your physician has selected the right strength medicine for your problem and area affected on the body. Please use your medication only as directed by your physician to prevent side effects.    Other eczema neck  Hx of eczema, has used [facility] in the past  Continue Opzelura twice daily as needed for itch to no more than 20% BSA, 10% BSA (10% of skin) for more than 3 months.   Return for 3-4 month follow up.  Graciella Belton, RMA, am acting as scribe for Forest Gleason, MD .  Documentation: I have reviewed the above documentation for accuracy and completeness, and I agree with the above.  Forest Gleason, MD

## 2022-12-31 ENCOUNTER — Other Ambulatory Visit: Payer: Self-pay

## 2023-01-04 ENCOUNTER — Encounter: Payer: Self-pay | Admitting: Dermatology

## 2023-01-07 ENCOUNTER — Other Ambulatory Visit (HOSPITAL_COMMUNITY): Payer: Self-pay

## 2023-01-07 ENCOUNTER — Telehealth: Payer: Self-pay

## 2023-01-07 NOTE — Telephone Encounter (Signed)
Do we know what the non-covered price is or if there is one?

## 2023-01-07 NOTE — Telephone Encounter (Signed)
Opzelura RX denied, per insurance: the use of this medication without a documented baseline evaluation of condition using one of the follow scoring tools, ISGA, EASI, POEM, SCORAD. Medical necessity is determined by adherence to generally accepted standards of medical practice in the [place].

## 2023-01-07 NOTE — Telephone Encounter (Signed)
Left message for patient to return my call. Wanting to know if she was able to get RX through [facility] at non covered price. aw

## 2023-01-08 ENCOUNTER — Other Ambulatory Visit: Payer: Self-pay

## 2023-01-08 NOTE — Telephone Encounter (Signed)
Called [facility] to get clarification on prescription. Due to prior authorization being done and denied, they can not offer Opzelura at "non covered" price. She would have to pay the full cash price amount, around $2,000 for prescription.

## 2023-01-09 NOTE — Telephone Encounter (Signed)
Can you check of [name] could offer it at anything reasonable? Thank you!

## 2023-01-11 MED ORDER — OPZELURA 1.5 % EX CREA: TOPICAL_CREAM | CUTANEOUS | 2 refills | Status: DC

## 2023-01-11 NOTE — Telephone Encounter (Addendum)
The rejection code/denial code given from insurance is not allowing these pharmacies to use the non covered rebate.   [facility] rep states if being used for on label use and patient has tried and failed topical steroids and non steroidal options, look into [facility].

## 2023-01-11 NOTE — Addendum Note (Signed)
Addended by: Johnsie Kindred R on: 01/11/2023 10:31 AM   Modules accepted: Orders

## 2023-01-11 NOTE — Telephone Encounter (Signed)
Prescription sent to [facility] and rep is helping on this issue.

## 2023-01-11 NOTE — Progress Notes (Unsigned)
PCP:  Leone Haven, MD   No chief complaint on file.   HPI:      [name] is a 47 y.o. G0P0000 who LMP was No LMP recorded., presents today for her annual examination.  Her menses are regular every 28-30 days, lasting 5 days.  Dysmenorrhea mild, no BTB. On POPs.  Sex activity: single partner, contraception - oral progesterone-only contraceptive. No pain/bleeding Last Pap: 12/08/21 Results were: no abnormalities /neg HPV DNA  Hx of STDs: none  Mammogram: 12/11/22  Results were normal, repeat in 12 months.  There is a FH of breast cancer in her mat aunt, who is BRCA/gene panel neg 2018. Pt doesn't qualify for cancer genetic testing due to age of dx of mat aunt. There is no FH of ovarian cancer. The patient does do self-breast exams.  Tobacco use: The patient denies current or previous tobacco use. Alcohol use: social drinker No drug use.  Exercise: very active  She does get adequate calcium and Vitamin D in her diet.  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 No side effects. Wants to continue Rx.  Labs with PCP. HTN  improved. Pt working from home and exercising more now.  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   History of cholecystectomy 10/28/2018   Hyperlipidemia    Hypertension    Neurogenic bladder    Neurogenic bladder    Pulmonary embolism ([place]) 08/2015   Urinary tract infection    Urine incontinence     Past Surgical History:  Procedure Laterality Date   APPENDECTOMY  07/2018   CHOLECYSTECTOMY  06/2018   COLONOSCOPY WITH PROPOFOL N/A 11/23/2022   Procedure: COLONOSCOPY WITH PROPOFOL;  Surgeon: Jonathon Bellows, MD;  Location: [facility] ENDOSCOPY;  Service: Gastroenterology;  Laterality: N/A;   DILATION AND  CURETTAGE OF UTERUS     tumor  1978/2004   Base of spinal cord   tumor removed      Family History  Problem Relation Age of Onset   Hyperlipidemia Mother    Arthritis Father    Hyperlipidemia Father    Heart disease Father    Hypertension Father    Cancer Maternal Grandfather        Colon/Prostate Cancer   Diabetes Paternal Grandfather    Alcohol abuse Sister    Drug abuse Sister    Mental illness Sister        BiPolar   Cancer Maternal Aunt        Breast Cancer   Breast cancer Maternal Aunt 71    Social History   Socioeconomic History   Marital status: Married    Spouse name: Not on file   Number of children: Not on file   Years of education: Not on file   Highest education level: Not on file  Occupational History   Not on file  Tobacco Use   Smoking status: Never   Smokeless tobacco: Never  Vaping Use   Vaping Use: Never used  Substance and Sexual Activity   Alcohol use: Yes    Alcohol/week: 0.0 standard drinks of alcohol    Comment: 1 drink every other week    Drug use: No   Sexual activity: Yes    Partners:  Male    Birth control/protection: Pill    Comment: Husband   Other Topics Concern   Not on file  Social History Narrative   Married   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    No children   5 dogs   Caffeine- Coffee 1 cup daily, tea- depends, soda every other day   Social Determinants of Health   Financial Resource Strain: Low Risk  (09/16/2021)   Overall Financial Resource Strain (CARDIA)    Difficulty of Paying Living Expenses: Not hard at all  Food Insecurity: Not on file  Transportation Needs: Not on file  Physical Activity: Not on file  Stress: Not on file  Social Connections: Not on file  Intimate Partner Violence: Not on file    No outpatient medications have been marked as taking for the 01/12/23 encounter (Appointment) with Brentyn Seehafer, Deirdre Evener, PA-C.     ROS:  Review of Systems   Constitutional:  Negative for fatigue, fever and unexpected weight change.  Respiratory:  Negative for cough, shortness of breath and wheezing.   Cardiovascular:  Negative for chest pain, palpitations and leg swelling.  Gastrointestinal:  Negative for blood in stool, constipation, diarrhea, nausea and vomiting.  Endocrine: Negative for cold intolerance, heat intolerance and polyuria.  Genitourinary:  Negative for dyspareunia, dysuria, flank pain, frequency, genital sores, hematuria, menstrual problem, pelvic pain, urgency, vaginal bleeding, vaginal discharge and vaginal pain.  Musculoskeletal:  Negative for back pain, joint swelling and myalgias.  Skin:  Negative for rash.  Neurological:  Negative for dizziness, syncope, light-headedness, numbness and headaches.  Hematological:  Negative for adenopathy.  Psychiatric/Behavioral:  Negative for agitation, confusion, sleep disturbance and suicidal ideas. The patient is not nervous/anxious.      Objective: There were no vitals taken for this visit.   Physical Exam Constitutional:      Appearance: She is well-developed.  Genitourinary:     Vulva normal.     Right Labia: No rash, tenderness or lesions.    Left Labia: No tenderness, lesions or rash.    No vaginal discharge, erythema or tenderness.      Right Adnexa: not tender and no mass present.    Left Adnexa: not tender and no mass present.    No cervical motion tenderness, friability or polyp.     Uterus is not enlarged or tender.  Breasts:    Right: No mass, nipple discharge, skin change or tenderness.     Left: No mass, nipple discharge, skin change or tenderness.  Neck:     Thyroid: No thyromegaly.  Cardiovascular:     Rate and Rhythm: Normal rate and regular rhythm.     Heart sounds: Normal heart sounds. No murmur heard. Pulmonary:     Effort: Pulmonary effort is normal.     Breath sounds: Normal breath sounds.  Abdominal:     Palpations: Abdomen is soft.      Tenderness: There is no abdominal tenderness. There is no guarding or rebound.  Musculoskeletal:        General: Normal range of motion.     Cervical back: Normal range of motion.  Lymphadenopathy:     Cervical: No cervical adenopathy.  Neurological:     General: No focal deficit present.     Mental Status: She is alert and oriented to person, place, and time.     Cranial Nerves: No cranial nerve deficit.  Skin:    General: Skin  is warm and dry.  Psychiatric:        Mood and Affect: Mood normal.        Behavior: Behavior normal.        Thought Content: Thought content normal.        Judgment: Judgment normal.  Vitals reviewed.     Assessment/Plan: Encounter for annual routine gynecological examination  Cervical cancer screening - Plan: Cytology - PAP  Screening for HPV (human papillomavirus) - Plan: Cytology - PAP  Encounter for screening mammogram for malignant neoplasm of breast; pt to has mammo sched  Encounter for surveillance of contraceptive pills - Plan: norethindrone (MICRONOR) 0.35 MG tablet; Rx RF  Anxiety - Plan: venlafaxine XR (EFFEXOR-XR) 150 MG 24 hr capsule; Rx RF. Doing well   No orders of the defined types were placed in this encounter.             GYN counsel mammography screening, adequate intake of calcium and vitamin D, diet and exercise     F/U  No follow-ups on file.  Bless Belshe B. Jadarious Dobbins, PA-C 01/11/2023 4:44 PM

## 2023-01-12 ENCOUNTER — Encounter: Payer: Self-pay | Admitting: Obstetrics and Gynecology

## 2023-01-12 ENCOUNTER — Ambulatory Visit (INDEPENDENT_AMBULATORY_CARE_PROVIDER_SITE_OTHER): Payer: Federal, State, Local not specified - PPO | Admitting: Obstetrics and Gynecology

## 2023-01-12 ENCOUNTER — Other Ambulatory Visit (HOSPITAL_COMMUNITY): Payer: Self-pay

## 2023-01-12 VITALS — BP 110/80 | Ht 67.0 in | Wt 272.0 lb

## 2023-01-12 DIAGNOSIS — Z3041 Encounter for surveillance of contraceptive pills: Secondary | ICD-10-CM

## 2023-01-12 DIAGNOSIS — Z01419 Encounter for gynecological examination (general) (routine) without abnormal findings: Secondary | ICD-10-CM | POA: Diagnosis not present

## 2023-01-12 DIAGNOSIS — Z1231 Encounter for screening mammogram for malignant neoplasm of breast: Secondary | ICD-10-CM

## 2023-01-12 DIAGNOSIS — F419 Anxiety disorder, unspecified: Secondary | ICD-10-CM

## 2023-01-12 MED ORDER — NORETHINDRONE 0.35 MG PO TABS: 1.0000 | ORAL_TABLET | Freq: Every day | ORAL | 3 refills | Status: DC

## 2023-01-12 NOTE — Patient Instructions (Signed)
I value your feedback and you entrusting us with your care. If you get a [place] patient survey, I would appreciate you taking the time to let us know about your experience today. Thank you! ? ? ?

## 2023-01-13 NOTE — Telephone Encounter (Signed)
Okay, we are using off label for morphea and lichen sclerosus with good effect after she failed topical clobetasol and calcipotriene. Can we try to get it through [employer]?

## 2023-01-14 NOTE — Telephone Encounter (Signed)
Can we ask rep for samples?

## 2023-01-14 NOTE — Telephone Encounter (Signed)
Left detailed message Opzelura samples left at front desk for pick up and would submit appeal to insurance for coverage. aw

## 2023-01-20 ENCOUNTER — Encounter: Payer: Self-pay | Admitting: Family Medicine

## 2023-01-20 ENCOUNTER — Ambulatory Visit: Payer: Federal, State, Local not specified - PPO | Admitting: Family Medicine

## 2023-01-20 VITALS — BP 130/82 | HR 80 | Temp 97.8°F | Ht 67.0 in | Wt 273.0 lb

## 2023-01-20 DIAGNOSIS — F32A Depression, unspecified: Secondary | ICD-10-CM

## 2023-01-20 DIAGNOSIS — I10 Essential (primary) hypertension: Secondary | ICD-10-CM | POA: Diagnosis not present

## 2023-01-20 DIAGNOSIS — F419 Anxiety disorder, unspecified: Secondary | ICD-10-CM

## 2023-01-20 MED ORDER — AMLODIPINE BESYLATE 10 MG PO TABS: 10.0000 mg | ORAL_TABLET | Freq: Every day | ORAL | 3 refills | Status: DC

## 2023-01-20 NOTE — Assessment & Plan Note (Signed)
Chronic issue.  Adequately controlled.  Continue Zoloft 50 mg daily.

## 2023-01-20 NOTE — Assessment & Plan Note (Signed)
Chronic issue.  Unfortunately she is above goal more frequently than I would like.  We will increase amlodipine to 10 mg daily.  She will check at home and let me know if her blood pressure remains above goal.  Her goal is less than 140/90.

## 2023-01-20 NOTE — Patient Instructions (Signed)
Nice to see you. We are going to increase your amlodipine to 10 mg daily. Your goal blood pressure is less than 140/90.  Please let me know if you are running above that consistently after we increase the dose.

## 2023-01-20 NOTE — Progress Notes (Signed)
[name], MD Phone: [phone]  Bridget Gould is a 47 y.o. female who presents today for f/u.  HYPERTENSION Disease Monitoring Home BP Monitoring 114-156/60-79 Chest pain- no    Dyspnea- no Medications Compliance-  taking amlodipine.  Edema- no     Constipation- no BMET    Component Value Date/Time   NA 136 08/05/2022 1228   NA 139 01/15/2014 0922   K 4.1 08/05/2022 1228   K 3.9 01/15/2014 0922   CL 105 08/05/2022 1228   CL 109 (H) 01/15/2014 0922   CO2 22 08/05/2022 1228   CO2 24 01/15/2014 0922   GLUCOSE 94 08/05/2022 1228   GLUCOSE 90 01/15/2014 0922   BUN 11 08/05/2022 1228   BUN 17 01/15/2014 0922   CREATININE 0.86 08/05/2022 1228   CREATININE 0.82 01/15/2014 0922   CALCIUM 9.1 08/05/2022 1228   CALCIUM 8.4 (L) 01/15/2014 0922   GFRNONAA >60 01/15/2014 0922   GFRAA >60 01/15/2014 0922   The 10-year ASCVD risk score (Arnett DK, et al., 2019) is: 1.4%   Values used to calculate the score:     Age: 60 years     Sex: Female     Is Non-Hispanic African American: No     Diabetic: No     Tobacco smoker: No     Systolic Blood Pressure: [id number] mmHg     Is BP treated: Yes     HDL Cholesterol: 49.7 mg/dL     Total Cholesterol: 205 mg/dL  Obesity: Patient notes she was approved for zepbound.  Excessively expensive.  She had to pancreatomy about the price down to 500 hours.  Patient notes continuing with a heart healthy diet.  She does beach body workouts for 30 minutes 5 to 6 days a week.  Anxiety/depression: She notes no anxiety or depression.  The Zoloft has been beneficial.  She tried hydroxyzine once and it was helpful.  No SI.  Social History   Tobacco Use  Smoking Status Never  Smokeless Tobacco Never    Current Outpatient Medications on File Prior to Visit  Medication Sig Dispense Refill   calcipotriene (DOVONOX) 0.005 % cream Apply topically 2 (two) times daily. 60 g 1   clobetasol (OLUX) 0.05 % topical foam Apply to affected skin twice a day prn  itch, Avoid applying to face, groin, and axilla. Use as directed. Long-term use can cause thinning of the skin. 50 g 2   clobetasol cream (TEMOVATE) 0.05 % Apply thin layer to aa's at left posterior shoulder bid for 2wks. If rash still persist can use qd after 2 weeks. Avoid applying to face, groin, and axilla. Use as directed. 30 g 0   cyclobenzaprine (FLEXERIL) 10 MG tablet Take 10 mg by mouth 3 (three) times daily as needed for muscle spasms.     gabapentin (NEURONTIN) 100 MG capsule Take 100 capsules by mouth daily.     hydrocortisone 2.5 % cream Apply twice daily to affected areas up 2 weeks PRN irritation 28 g 3   hydrocortisone 2.5 % cream Apply twice a day as needed apply to affected skin  up to 2 weeks prn 30 g 3   hydrOXYzine (ATARAX) 10 MG tablet Take 1 tablet (10 mg total) by mouth 3 (three) times daily as needed. 30 tablet 0   ketoconazole (NIZORAL) 2 % cream APPLY TWICE DAILY AS NEEDED FOR ITCHING 30 g 3   ketoconazole (NIZORAL) 2 % cream Apply to affected skin twice a day 60 g 2  norethindrone (MICRONOR) 0.35 MG tablet Take 1 tablet (0.35 mg total) by mouth daily. 84 tablet 3   Ruxolitinib Phosphate (OPZELURA) 1.5 % CREA Apply twice daily as directed 60 g 2   sertraline (ZOLOFT) 50 MG tablet TAKE 1 TABLET BY MOUTH EVERY DAY 90 tablet 2   sulfamethoxazole-trimethoprim (BACTRIM,SEPTRA) 400-80 MG tablet Take 0.5 tablets by mouth daily.  1   tirzepatide (ZEPBOUND) 5 MG/0.5ML Pen Inject 5 mg into the skin once a week. Start after completing the 28 days of the 2.5 mg dose. 6 mL 0   No current facility-administered medications on file prior to visit.     ROS see history of present illness  Objective  Physical Exam Vitals:   01/20/23 0838  BP: 130/82  Pulse: 80  Temp: 97.8 F (36.6 C)  SpO2: 99%    BP Readings from Last 3 Encounters:  01/20/23 130/82  01/12/23 110/80  11/23/22 120/75   Wt Readings from Last 3 Encounters:  01/20/23 273 lb (123.8 kg)  01/12/23 272 lb  (123.4 kg)  11/23/22 272 lb 12.4 oz (123.7 kg)    Physical Exam Constitutional:      General: She is not in acute distress.    Appearance: She is not diaphoretic.  Cardiovascular:     Rate and Rhythm: Normal rate and regular rhythm.     Heart sounds: Normal heart sounds.  Pulmonary:     Effort: Pulmonary effort is normal.     Breath sounds: Normal breath sounds.  Skin:    General: Skin is warm and dry.  Neurological:     Mental Status: She is alert.      Assessment/Plan: Please see individual problem list.  Hypertension, unspecified type Assessment & Plan: Chronic issue.  Unfortunately she is above goal more frequently than I would like.  We will increase amlodipine to 10 mg daily.  She will check at home and let me know if her blood pressure remains above goal.  Her goal is less than 140/90.  Orders: -     amLODIPine Besylate; Take 1 tablet (10 mg total) by mouth daily.  Dispense: 90 tablet; Refill: 3  Anxiety and depression Assessment & Plan: Chronic issue.  Adequately controlled.  Continue Zoloft 50 mg daily.   Morbid obesity ([place]) Assessment & Plan: Chronic issue.  She will continue healthy diet and exercise.  I encouraged her to look and see what her deductible and out-of-pocket max are to determine if Zepbound may become much more affordable in the future.      Return in about 3 months (around 04/22/2023) for htn.   [name], MD [place]

## 2023-01-20 NOTE — Assessment & Plan Note (Signed)
Chronic issue.  She will continue healthy diet and exercise.  I encouraged her to look and see what her deductible and out-of-pocket max are to determine if Zepbound may become much more affordable in the future.

## 2023-01-22 ENCOUNTER — Other Ambulatory Visit (HOSPITAL_COMMUNITY): Payer: Self-pay

## 2023-02-05 NOTE — Telephone Encounter (Signed)
Appeal letter drafted and waiting to be approved by Dr. Neale Burly.aw

## 2023-02-09 ENCOUNTER — Other Ambulatory Visit: Payer: Self-pay | Admitting: Dermatology

## 2023-02-09 DIAGNOSIS — R21 Rash and other nonspecific skin eruption: Secondary | ICD-10-CM

## 2023-04-09 NOTE — Telephone Encounter (Signed)
Appeal submitted previously.

## 2023-04-13 DIAGNOSIS — J209 Acute bronchitis, unspecified: Secondary | ICD-10-CM | POA: Diagnosis not present

## 2023-04-14 ENCOUNTER — Ambulatory Visit: Payer: Federal, State, Local not specified - PPO | Admitting: Dermatology

## 2023-04-23 ENCOUNTER — Encounter: Payer: Self-pay | Admitting: Family Medicine

## 2023-04-23 ENCOUNTER — Ambulatory Visit: Payer: Federal, State, Local not specified - PPO | Admitting: Family Medicine

## 2023-04-23 VITALS — BP 116/74 | HR 77 | Temp 98.3°F | Ht 67.0 in | Wt 279.8 lb

## 2023-04-23 DIAGNOSIS — I10 Essential (primary) hypertension: Secondary | ICD-10-CM | POA: Diagnosis not present

## 2023-04-23 DIAGNOSIS — F419 Anxiety disorder, unspecified: Secondary | ICD-10-CM

## 2023-04-23 DIAGNOSIS — J40 Bronchitis, not specified as acute or chronic: Secondary | ICD-10-CM | POA: Diagnosis not present

## 2023-04-23 MED ORDER — ALBUTEROL SULFATE HFA 108 (90 BASE) MCG/ACT IN AERS: 2.0000 | INHALATION_SPRAY | Freq: Four times a day (QID) | RESPIRATORY_TRACT | 0 refills | Status: DC | PRN

## 2023-04-23 MED ORDER — DOXYCYCLINE HYCLATE 100 MG PO TABS: 100.0000 mg | ORAL_TABLET | Freq: Two times a day (BID) | ORAL | 0 refills | Status: DC

## 2023-04-23 MED ORDER — PREDNISONE 20 MG PO TABS: 40.0000 mg | ORAL_TABLET | Freq: Every day | ORAL | 0 refills | Status: DC

## 2023-04-23 NOTE — Assessment & Plan Note (Addendum)
Suspect bronchitis.  Has some reactive airway component with the wheezing.  We will treat her with doxycycline and prednisone.  Counseled on risk of diarrhea and skin sensitivity to the sun with doxycycline.  Counseled on risk of agitation and sleep issues with prednisone.  I encouraged her to use an albuterol inhaler every 6 hours while awake for the next 2 days and then every 6 hours as needed.  If not improving with this she will let us know.

## 2023-04-23 NOTE — Progress Notes (Signed)
Marikay Alar, MD Phone: [phone]  Bridget Gould is a 47 y.o. female who presents today for follow-up.  Hypertension: Typically 130s-150s/60s-90s at home.  She wonders if her cuff is inaccurate.  She takes amlodipine.  No chest pain, shortness of breath, or edema.  Obesity: Continues to eat a heart healthy diet.  She does beach body workouts 30 to 45 minutes 6 times a week.  Bronchitis: Patient notes she came back from a cruise and was diagnosed with bronchitis.  She was treated with azithromycin and a prednisone taper with little benefit.  She continues to have cough with some tightness in her chest and low energy levels.  She has some postnasal drip.  Minimal head congestion.  She has had 2 negative COVID test.  No shortness of breath.  Some wheezing.  Social History   Tobacco Use  Smoking Status Never  Smokeless Tobacco Never    Current Outpatient Medications on File Prior to Visit  Medication Sig Dispense Refill   amLODipine (NORVASC) 10 MG tablet Take 1 tablet (10 mg total) by mouth daily. 90 tablet 3   calcipotriene (DOVONOX) 0.005 % cream APPLY TOPICALLY TWICE A DAY 60 g 1   clobetasol (OLUX) 0.05 % topical foam Apply to affected skin twice a day prn itch, Avoid applying to face, groin, and axilla. Use as directed. Long-term use can cause thinning of the skin. 50 g 2   clobetasol cream (TEMOVATE) 0.05 % Apply thin layer to aa's at left posterior shoulder bid for 2wks. If rash still persist can use qd after 2 weeks. Avoid applying to face, groin, and axilla. Use as directed. 30 g 0   cyclobenzaprine (FLEXERIL) 10 MG tablet Take 10 mg by mouth 3 (three) times daily as needed for muscle spasms.     gabapentin (NEURONTIN) 100 MG capsule Take 100 capsules by mouth daily.     hydrocortisone 2.5 % cream Apply twice daily to affected areas up 2 weeks PRN irritation 28 g 3   hydrocortisone 2.5 % cream Apply twice a day as needed apply to affected skin  up to 2 weeks prn 30 g 3    hydrOXYzine (ATARAX) 10 MG tablet Take 1 tablet (10 mg total) by mouth 3 (three) times daily as needed. 30 tablet 0   ketoconazole (NIZORAL) 2 % cream Apply to affected skin twice a day 60 g 2   norethindrone (MICRONOR) 0.35 MG tablet Take 1 tablet (0.35 mg total) by mouth daily. 84 tablet 3   Ruxolitinib Phosphate (OPZELURA) 1.5 % CREA Apply twice daily as directed 60 g 2   sertraline (ZOLOFT) 50 MG tablet TAKE 1 TABLET BY MOUTH EVERY DAY 90 tablet 2   sulfamethoxazole-trimethoprim (BACTRIM,SEPTRA) 400-80 MG tablet Take 0.5 tablets by mouth daily.  1   No current facility-administered medications on file prior to visit.     ROS see history of present illness  Objective  Physical Exam Vitals:   04/23/23 1617  BP: 116/74  Pulse: 77  Temp: 98.3 F (36.8 C)  SpO2: 98%    BP Readings from Last 3 Encounters:  04/23/23 116/74  01/20/23 130/82  01/12/23 110/80   Wt Readings from Last 3 Encounters:  04/23/23 279 lb 12.8 oz (126.9 kg)  01/20/23 273 lb (123.8 kg)  01/12/23 272 lb (123.4 kg)    Physical Exam Constitutional:      General: She is not in acute distress.    Appearance: She is not diaphoretic.  Cardiovascular:  Rate and Rhythm: Normal rate and regular rhythm.     Heart sounds: Normal heart sounds.  Pulmonary:     Effort: Pulmonary effort is normal.     Breath sounds: Normal breath sounds.  Skin:    General: Skin is warm and dry.  Neurological:     Mental Status: She is alert.      Assessment/Plan: Please see individual problem list.  Hypertension, unspecified type Assessment & Plan: Chronic issue.  I think her blood pressure cuff at home may be an accurate.  She will return for nurse BP check to have it compared to our readings.  She will continue amlodipine 10 mg daily.   Anxiety and depression  Bronchitis Assessment & Plan: Suspect bronchitis.  Has some reactive airway component with the wheezing.  We will treat her with doxycycline and  prednisone.  Counseled on risk of diarrhea and skin sensitivity to the sun with doxycycline.  Counseled on risk of agitation and sleep issues with prednisone.  I encouraged her to use an albuterol inhaler every 6 hours while awake for the next 2 days and then every 6 hours as needed.  If not improving with this she will let us know.  Orders: -     Doxycycline Hyclate; Take 1 tablet (100 mg total) by mouth 2 (two) times daily.  Dispense: 14 tablet; Refill: 0 -     predniSONE; Take 2 tablets (40 mg total) by mouth daily with breakfast.  Dispense: 10 tablet; Refill: 0 -     Albuterol Sulfate HFA; Inhale 2 puffs into the lungs every 6 (six) hours as needed for wheezing or shortness of breath.  Dispense: 8 g; Refill: 0  Morbid obesity (HCC) Assessment & Plan: Chronic issue.  Encouraged continued healthy diet and exercise.     Return in about 1 week (around 04/30/2023) for Nurse BP check with home blood pressure cuff, 6 months with PCP.   Marikay Alar, MD [facility]

## 2023-04-23 NOTE — Assessment & Plan Note (Signed)
Chronic issue.  Encouraged continued healthy diet and exercise.

## 2023-04-23 NOTE — Assessment & Plan Note (Signed)
Chronic issue.  I think her blood pressure cuff at home may be an accurate.  She will return for nurse BP check to have it compared to our readings.  She will continue amlodipine 10 mg daily.

## 2023-04-23 NOTE — Patient Instructions (Signed)
Nice to see you. Please start the doxycycline and prednisone.  Please monitor for sleep issues and agitation on the prednisone.  If you develop excessive diarrhea on the doxycycline please let us know. Please use the albuterol inhaler every 6 hours for the next 2 days and then every 6 hours as needed.  If this makes you excessively jittery please stop using it.

## 2023-05-04 ENCOUNTER — Ambulatory Visit: Payer: Federal, State, Local not specified - PPO

## 2023-05-04 DIAGNOSIS — I10 Essential (primary) hypertension: Secondary | ICD-10-CM

## 2023-05-04 NOTE — Progress Notes (Signed)
Patient came in to office to have home  BP machine checked but I noticed that she needs to order a larger cuff. The cuff she has is too small so therefore she will continue to get high numbers. Pt stated that she will order a larger cuff and continue to check her BP at home and let us know what her numbers are then.

## 2023-05-10 ENCOUNTER — Ambulatory Visit: Payer: Federal, State, Local not specified - PPO

## 2023-05-15 ENCOUNTER — Other Ambulatory Visit: Payer: Self-pay | Admitting: Family Medicine

## 2023-05-15 DIAGNOSIS — J40 Bronchitis, not specified as acute or chronic: Secondary | ICD-10-CM

## 2023-05-16 ENCOUNTER — Other Ambulatory Visit: Payer: Self-pay | Admitting: Family Medicine

## 2023-05-16 DIAGNOSIS — F32A Depression, unspecified: Secondary | ICD-10-CM

## 2023-06-14 ENCOUNTER — Ambulatory Visit: Payer: Federal, State, Local not specified - PPO | Admitting: Dermatology

## 2023-07-01 DIAGNOSIS — N1339 Other hydronephrosis: Secondary | ICD-10-CM | POA: Diagnosis not present

## 2023-07-01 DIAGNOSIS — R8271 Bacteriuria: Secondary | ICD-10-CM | POA: Diagnosis not present

## 2023-07-05 DIAGNOSIS — Z20822 Contact with and (suspected) exposure to covid-19: Secondary | ICD-10-CM | POA: Diagnosis not present

## 2023-07-05 DIAGNOSIS — J019 Acute sinusitis, unspecified: Secondary | ICD-10-CM | POA: Diagnosis not present

## 2023-07-12 ENCOUNTER — Ambulatory Visit: Payer: Federal, State, Local not specified - PPO | Admitting: Dermatology

## 2023-08-10 ENCOUNTER — Ambulatory Visit: Payer: Federal, State, Local not specified - PPO | Admitting: Dermatology

## 2023-08-10 ENCOUNTER — Encounter: Payer: Self-pay | Admitting: Dermatology

## 2023-08-10 DIAGNOSIS — L940 Localized scleroderma [morphea]: Secondary | ICD-10-CM | POA: Diagnosis not present

## 2023-08-10 DIAGNOSIS — D2372 Other benign neoplasm of skin of left lower limb, including hip: Secondary | ICD-10-CM

## 2023-08-10 DIAGNOSIS — L900 Lichen sclerosus et atrophicus: Secondary | ICD-10-CM | POA: Diagnosis not present

## 2023-08-10 DIAGNOSIS — D239 Other benign neoplasm of skin, unspecified: Secondary | ICD-10-CM

## 2023-08-10 DIAGNOSIS — L631 Alopecia universalis: Secondary | ICD-10-CM

## 2023-08-10 NOTE — Progress Notes (Signed)
   Follow-Up Visit   Subjective  Bridget Gould is a 47 y.o. female who presents for the following: 6 months f/u on Lichen sclerosus on her left arm and upper back using Clobetasol foam, Opzelura cream and Calcipotriene cream with a good response The patient has spots, moles and lesions to be evaluated, some may be new or changing and the patient may have concern these could be cancer.   The following portions of the chart were reviewed this encounter and updated as appropriate: medications, allergies, medical history  Review of Systems:  No other skin or systemic complaints except as noted in HPI or Assessment and Plan.  Objective  Well appearing patient in no apparent distress; mood and affect are within normal limits.  A focused examination was performed of the following areas:face,back,left arm, left leg   Relevant exam findings are noted in the Assessment and Plan.    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 Central focal atrophic white plaque of upper back lesion Per patient clear in the groin area      Chronic condition with duration or expected duration over one year. Currently well-controlled.   Continue Calcipotriene cream twice daily on upper back Continue Opzelura twice daily on upper back and left arm  Continue clobetasol twice daily for shortest needed duration (up to 2 weeks) as needed for flares. Avoid applying to face, groin, and axilla. Use as directed. Long-term use can cause thinning of the skin.   Topical steroids (such as triamcinolone, fluocinolone, fluocinonide, mometasone, clobetasol, halobetasol, betamethasone, hydrocortisone) can cause thinning and lightening of the skin if they are used for too long in the same area. Your physician has selected the right strength medicine for your problem and area affected on  the body. Please use your medication only as directed by your physician to prevent side effects.    DERMATOFIBROMA Exam: Firm pink/brown papulenodule with dimple sign on the left lower posterior leg Treatment Plan: A dermatofibroma is a benign growth possibly related to trauma, such as an insect bite, cut from shaving, or inflamed acne-type bump.  Treatment options to remove include shave or excision with resulting scar and risk of recurrence.  Since benign-appearing and not bothersome, will observe for now.     Alopecia areata scalp  Scattered few depigmented hairs on scalp. Otherwise diffuse alopecia of head Universalis, chronic persistent autoimmune condition, currently no treatment   Hx of pulmonary embolism and has increased risk of blood clots.  No longer taking blood thinners.   Alopecia present since around 2005. Previously tried oral steroids, a variety of topicals without resolution. She inquires about any new therapeutic options today. New options still carry warning about thrombosis. Patient defers given history of PE    Return in about 1 year (around 08/09/2024) for Lichen sclerosus .  IAngelique Holm, CMA, am acting as scribe for Elie Goody, MD .   Documentation: I have reviewed the above documentation for accuracy and completeness, and I agree with the above.  Elie Goody, MD

## 2023-08-10 NOTE — Patient Instructions (Signed)

## 2023-08-29 ENCOUNTER — Other Ambulatory Visit: Payer: Self-pay | Admitting: Dermatology

## 2023-08-29 DIAGNOSIS — L900 Lichen sclerosus et atrophicus: Secondary | ICD-10-CM

## 2023-10-08 ENCOUNTER — Other Ambulatory Visit (HOSPITAL_COMMUNITY): Payer: Self-pay

## 2023-10-18 ENCOUNTER — Other Ambulatory Visit: Payer: Self-pay | Admitting: Family Medicine

## 2023-10-18 DIAGNOSIS — F32A Depression, unspecified: Secondary | ICD-10-CM

## 2023-10-29 ENCOUNTER — Ambulatory Visit: Payer: Federal, State, Local not specified - PPO | Admitting: Family Medicine

## 2023-10-29 ENCOUNTER — Encounter: Payer: Self-pay | Admitting: Family Medicine

## 2023-10-29 VITALS — BP 128/72 | HR 95 | Ht 67.0 in | Wt 307.6 lb

## 2023-10-29 DIAGNOSIS — J309 Allergic rhinitis, unspecified: Secondary | ICD-10-CM | POA: Diagnosis not present

## 2023-10-29 DIAGNOSIS — I10 Essential (primary) hypertension: Secondary | ICD-10-CM

## 2023-10-29 DIAGNOSIS — F419 Anxiety disorder, unspecified: Secondary | ICD-10-CM | POA: Diagnosis not present

## 2023-10-29 DIAGNOSIS — R609 Edema, unspecified: Secondary | ICD-10-CM | POA: Diagnosis not present

## 2023-10-29 DIAGNOSIS — F32A Depression, unspecified: Secondary | ICD-10-CM

## 2023-10-29 MED ORDER — SERTRALINE HCL 50 MG PO TABS: 50.0000 mg | ORAL_TABLET | Freq: Every day | ORAL | 2 refills | Status: DC

## 2023-10-29 MED ORDER — HYDROXYZINE HCL 10 MG PO TABS: 10.0000 mg | ORAL_TABLET | Freq: Three times a day (TID) | ORAL | 0 refills | Status: AC | PRN

## 2023-10-29 MED ORDER — FLUTICASONE PROPIONATE 50 MCG/ACT NA SUSP: 2.0000 | Freq: Every day | NASAL | 6 refills | Status: DC

## 2023-10-29 NOTE — Assessment & Plan Note (Signed)
Chronic issue well-controlled.  She will continue amlodipine 10 mg daily.

## 2023-10-29 NOTE — Assessment & Plan Note (Signed)
Undetermined cause.  I doubt amlodipine will be causing widespread swelling.  This may just be related to weight gain though we will check lab work to evaluate for other underlying causes.

## 2023-10-29 NOTE — Assessment & Plan Note (Signed)
Chronic issue.  Suspect allergies are contributing to the mucus in her throat.  We will trial Flonase 2 sprays each nostril once daily.

## 2023-10-29 NOTE — Progress Notes (Signed)
Marikay Alar, MD Phone: [phone]  Bridget Gould is a 47 y.o. female who presents today for follow-up.  Hypertension: No chest pain or shortness of breath.  She is taking amlodipine.  Swelling: Patient notes she feels swollen all over.  Notices it in her legs but also notices it all over her body.  Throat congestion: Patient notes this has been going on since August.  She feels as though there is mucus in her throat.  Occasionally gets rattle in her chest.  Occasionally coughs.  No sinus or nasal congestion.  No rhinorrhea.  She tried Mucinex and over-the-counter allergy medication with little benefit.  Social History   Tobacco Use  Smoking Status Never  Smokeless Tobacco Never    Current Outpatient Medications on File Prior to Visit  Medication Sig Dispense Refill   amLODipine (NORVASC) 10 MG tablet Take 1 tablet (10 mg total) by mouth daily. 90 tablet 3   calcipotriene (DOVONOX) 0.005 % cream APPLY TOPICALLY TWICE A DAY 60 g 1   clobetasol (OLUX) 0.05 % topical foam APPLY TO AFFECTED SKIN TWICE A DAY AS NEEDED FOR ITCH AS DIRECTED. AVOID APPLYING TO FACE, GROIN, AND AXILLA. LONG-TERM USE CAN CAUSE THINNING OF THE SKIN. 50 g 2   clobetasol cream (TEMOVATE) 0.05 % Apply thin layer to aa's at left posterior shoulder bid for 2wks. If rash still persist can use qd after 2 weeks. Avoid applying to face, groin, and axilla. Use as directed. 30 g 0   norethindrone (MICRONOR) 0.35 MG tablet Take 1 tablet (0.35 mg total) by mouth daily. 84 tablet 3   Ruxolitinib Phosphate (OPZELURA) 1.5 % CREA Apply twice daily as directed 60 g 2   sulfamethoxazole-trimethoprim (BACTRIM,SEPTRA) 400-80 MG tablet Take 0.5 tablets by mouth daily.  1   cyclobenzaprine (FLEXERIL) 10 MG tablet Take 10 mg by mouth 3 (three) times daily as needed for muscle spasms. (Patient not taking: Reported on 10/29/2023)     gabapentin (NEURONTIN) 100 MG capsule Take 100 capsules by mouth daily. (Patient not taking:  Reported on 10/29/2023)     No current facility-administered medications on file prior to visit.     ROS see history of present illness  Objective  Physical Exam Vitals:   10/29/23 1622  BP: 128/72  Pulse: 95  SpO2: 96%    BP Readings from Last 3 Encounters:  10/29/23 128/72  04/23/23 116/74  01/20/23 130/82   Wt Readings from Last 3 Encounters:  10/29/23 (!) 307 lb 9.6 oz (139.5 kg)  04/23/23 279 lb 12.8 oz (126.9 kg)  01/20/23 273 lb (123.8 kg)    Physical Exam Constitutional:      General: She is not in acute distress.    Appearance: She is not diaphoretic.  HENT:     Mouth/Throat:     Mouth: Mucous membranes are moist.     Pharynx: Oropharynx is clear.  Cardiovascular:     Rate and Rhythm: Normal rate and regular rhythm.     Heart sounds: Normal heart sounds.  Pulmonary:     Effort: Pulmonary effort is normal.     Breath sounds: Normal breath sounds.  Skin:    General: Skin is warm and dry.  Neurological:     Mental Status: She is alert.      Assessment/Plan: Please see individual problem list.  Hypertension, unspecified type Assessment & Plan: Chronic issue well-controlled.  She will continue amlodipine 10 mg daily.   Anxiety and depression -     hydrOXYzine  HCl; Take 1 tablet (10 mg total) by mouth 3 (three) times daily as needed.  Dispense: 30 tablet; Refill: 0 -     Sertraline HCl; Take 1 tablet (50 mg total) by mouth daily.  Dispense: 90 tablet; Refill: 2  Swelling Assessment & Plan: Undetermined cause.  I doubt amlodipine will be causing widespread swelling.  This may just be related to weight gain though we will check lab work to evaluate for other underlying causes.  Orders: -     CBC; Future -     Comprehensive metabolic panel; Future -     TSH; Future -     Brain natriuretic peptide; Future  Allergic rhinitis, unspecified seasonality, unspecified trigger Assessment & Plan: Chronic issue.  Suspect allergies are contributing to the  mucus in her throat.  We will trial Flonase 2 sprays each nostril once daily.  Orders: -     Fluticasone Propionate; Place 2 sprays into both nostrils daily.  Dispense: 16 g; Refill: 6    Return in about 1 week (around 11/05/2023) for labs, 6 months transfer of care.   Marikay Alar, MD [facility]

## 2023-11-02 ENCOUNTER — Other Ambulatory Visit: Payer: Self-pay | Admitting: Obstetrics and Gynecology

## 2023-11-02 DIAGNOSIS — Z1231 Encounter for screening mammogram for malignant neoplasm of breast: Secondary | ICD-10-CM

## 2023-11-05 ENCOUNTER — Other Ambulatory Visit (INDEPENDENT_AMBULATORY_CARE_PROVIDER_SITE_OTHER): Payer: Federal, State, Local not specified - PPO

## 2023-11-05 ENCOUNTER — Other Ambulatory Visit: Payer: Federal, State, Local not specified - PPO

## 2023-11-05 DIAGNOSIS — R609 Edema, unspecified: Secondary | ICD-10-CM

## 2023-11-05 DIAGNOSIS — Z23 Encounter for immunization: Secondary | ICD-10-CM

## 2023-11-05 NOTE — Progress Notes (Signed)
 After obtaining consent, and per orders of Dr. Birdie Sons, injection of Influenza vaccination given IM in left deltoid by Valentino Nose. Patient tolerated injection well.

## 2023-11-06 LAB — TSH: TSH: 1.97 m[IU]/L

## 2023-11-06 LAB — COMPLETE METABOLIC PANEL WITH GFR
AG Ratio: 1.5 (calc) (ref 1.0–2.5)
ALT: 24 U/L (ref 6–29)
AST: 19 U/L (ref 10–35)
Albumin: 4.1 g/dL (ref 3.6–5.1)
Alkaline phosphatase (APISO): 127 U/L — ABNORMAL HIGH (ref 31–125)
BUN: 14 mg/dL (ref 7–25)
CO2: 23 mmol/L (ref 20–32)
Calcium: 9.1 mg/dL (ref 8.6–10.2)
Chloride: 105 mmol/L (ref 98–110)
Creat: 0.82 mg/dL (ref 0.50–0.99)
Globulin: 2.7 g/dL (ref 1.9–3.7)
Glucose, Bld: 98 mg/dL (ref 65–99)
Potassium: 4 mmol/L (ref 3.5–5.3)
Sodium: 138 mmol/L (ref 135–146)
Total Bilirubin: 0.3 mg/dL (ref 0.2–1.2)
Total Protein: 6.8 g/dL (ref 6.1–8.1)
eGFR: 89 mL/min/{1.73_m2} (ref >=60)

## 2023-11-06 LAB — CBC
HCT: 38.2 % (ref 35.0–45.0)
Hemoglobin: 12.5 g/dL (ref 11.7–15.5)
MCH: 30.9 pg (ref 27.0–33.0)
MCHC: 32.7 g/dL (ref 32.0–36.0)
MCV: 94.3 fL (ref 80.0–100.0)
MPV: 10.1 fL (ref 7.5–12.5)
Platelets: 328 10*3/uL (ref 140–400)
RBC: 4.05 10*6/uL (ref 3.80–5.10)
RDW: 12.5 % (ref 11.0–15.0)
WBC: 8.6 10*3/uL (ref 3.8–10.8)

## 2023-11-06 LAB — BRAIN NATRIURETIC PEPTIDE: Brain Natriuretic Peptide: 21 pg/mL (ref <100)

## 2023-11-08 ENCOUNTER — Telehealth: Payer: Self-pay

## 2023-11-08 NOTE — Telephone Encounter (Signed)
-----   Message from Camellia Her sent at 11/08/2023 10:14 AM EST ----- Please of the patient know her alkaline phosphatase is very minimally elevated.  This can be rechecked at her next visit. She should be scheduled for 3-6 months to establish care with a new provider.  Her lab work did not reveal a cause for her swelling.  Her swelling could be related to weight gain.  Thanks.

## 2023-11-08 NOTE — Telephone Encounter (Signed)
 Left message to call the office back regarding the lab results below.

## 2023-11-09 ENCOUNTER — Telehealth: Payer: Self-pay

## 2023-11-09 NOTE — Telephone Encounter (Signed)
 Copied from CRM [id number]. Topic: Clinical - Lab/Test Results >> Nov 09, 2023  8:32 AM Elita Quick wrote: Reason for CRM: PATIENT CALLED ABOUT RESULTS FROM HER LAB WORK AND IS REQUESTING A CALL BACK

## 2023-11-09 NOTE — Telephone Encounter (Signed)
 Called pt back  and labs have been provided as well as scheduled for a TOC

## 2023-11-12 ENCOUNTER — Encounter: Payer: Self-pay | Admitting: Family Medicine

## 2023-11-15 MED ORDER — TIRZEPATIDE-WEIGHT MANAGEMENT 5 MG/0.5ML ~~LOC~~ SOLN: 5.0000 mg | SUBCUTANEOUS | 1 refills | Status: DC

## 2023-11-15 MED ORDER — TIRZEPATIDE-WEIGHT MANAGEMENT 2.5 MG/0.5ML ~~LOC~~ SOLN: 2.5000 mg | SUBCUTANEOUS | 0 refills | Status: DC

## 2023-11-26 ENCOUNTER — Other Ambulatory Visit (HOSPITAL_COMMUNITY): Payer: Self-pay

## 2023-11-26 ENCOUNTER — Other Ambulatory Visit: Payer: Self-pay | Admitting: Family Medicine

## 2023-11-26 MED ORDER — TIRZEPATIDE-WEIGHT MANAGEMENT 5 MG/0.5ML ~~LOC~~ SOAJ: 5.0000 mg | SUBCUTANEOUS | 1 refills | Status: DC

## 2023-11-26 MED ORDER — TIRZEPATIDE-WEIGHT MANAGEMENT 2.5 MG/0.5ML ~~LOC~~ SOLN
2.5000 mg | SUBCUTANEOUS | 0 refills | Status: DC
  Filled 2023-11-26: qty 2, 28d supply, fill #0

## 2023-11-26 MED ORDER — TIRZEPATIDE-WEIGHT MANAGEMENT 2.5 MG/0.5ML ~~LOC~~ SOAJ: 2.5000 mg | SUBCUTANEOUS | 0 refills | Status: AC

## 2023-12-01 ENCOUNTER — Other Ambulatory Visit (HOSPITAL_COMMUNITY): Payer: Self-pay

## 2023-12-01 ENCOUNTER — Telehealth: Payer: Self-pay

## 2023-12-01 NOTE — Telephone Encounter (Signed)
Pharmacy Patient Advocate Encounter   Received notification from CoverMyMeds that prior authorization for Zepbound 2.5MG /0.5ML solution is required/requested.   Insurance verification completed.   The patient is insured through [employer] [facility] .   Per test claim: Prior Authorization form/request asks a question that requires your assistance. Please see the question below and advise accordingly. The PA will not be submitted until the necessary information is received.

## 2023-12-03 ENCOUNTER — Other Ambulatory Visit (HOSPITAL_COMMUNITY): Payer: Self-pay

## 2023-12-03 NOTE — Telephone Encounter (Signed)
Pharmacy Patient Advocate Encounter   Received notification from CoverMyMeds that prior authorization for Zepbound 2.5MG /0.5ML solution is required/requested.   Insurance verification completed.   The patient is insured through [employer] [facility] .   Per test claim: PA required; PA submitted to above mentioned insurance via CoverMyMeds Key/confirmation #/EOC [facility] Status is pending

## 2023-12-06 NOTE — Telephone Encounter (Signed)
Pharmacy Patient Advocate Encounter  Received notification from [employer] [facility] that Prior Authorization for Zepbound 2.5mg /0.71ml has been DENIED.  See denial reason below. No denial letter attached in CMM. Will attach denial letter to Media tab once received.   PA #/Case ID/Reference #: [id number]

## 2023-12-13 ENCOUNTER — Ambulatory Visit
Admission: RE | Admit: 2023-12-13 | Discharge: 2023-12-13 | Disposition: A | Discharge status: Home / Self Care | Payer: Federal, State, Local not specified - PPO | Source: Ambulatory Visit | Attending: Obstetrics and Gynecology | Admitting: Obstetrics and Gynecology

## 2023-12-13 DIAGNOSIS — Z1231 Encounter for screening mammogram for malignant neoplasm of breast: Secondary | ICD-10-CM | POA: Insufficient documentation

## 2023-12-14 ENCOUNTER — Encounter: Payer: Self-pay | Admitting: Obstetrics and Gynecology

## 2024-01-11 ENCOUNTER — Other Ambulatory Visit (HOSPITAL_COMMUNITY): Payer: Self-pay

## 2024-01-14 ENCOUNTER — Other Ambulatory Visit (HOSPITAL_COMMUNITY): Payer: Self-pay

## 2024-01-17 ENCOUNTER — Other Ambulatory Visit: Payer: Self-pay

## 2024-01-17 DIAGNOSIS — I10 Essential (primary) hypertension: Secondary | ICD-10-CM

## 2024-01-17 MED ORDER — AMLODIPINE BESYLATE 10 MG PO TABS: 10.0000 mg | ORAL_TABLET | Freq: Every day | ORAL | 0 refills | Status: DC

## 2024-01-23 ENCOUNTER — Other Ambulatory Visit: Payer: Self-pay | Admitting: Obstetrics and Gynecology

## 2024-01-23 DIAGNOSIS — Z3041 Encounter for surveillance of contraceptive pills: Secondary | ICD-10-CM

## 2024-02-03 ENCOUNTER — Other Ambulatory Visit (HOSPITAL_COMMUNITY): Payer: Self-pay

## 2024-02-10 DIAGNOSIS — E785 Hyperlipidemia, unspecified: Secondary | ICD-10-CM | POA: Diagnosis not present

## 2024-02-10 DIAGNOSIS — I10 Essential (primary) hypertension: Secondary | ICD-10-CM | POA: Diagnosis not present

## 2024-02-10 DIAGNOSIS — R519 Headache, unspecified: Secondary | ICD-10-CM | POA: Diagnosis not present

## 2024-02-10 DIAGNOSIS — E559 Vitamin D deficiency, unspecified: Secondary | ICD-10-CM | POA: Diagnosis not present

## 2024-02-10 DIAGNOSIS — F419 Anxiety disorder, unspecified: Secondary | ICD-10-CM | POA: Diagnosis not present

## 2024-02-10 DIAGNOSIS — N959 Unspecified menopausal and perimenopausal disorder: Secondary | ICD-10-CM | POA: Diagnosis not present

## 2024-02-10 DIAGNOSIS — F32A Depression, unspecified: Secondary | ICD-10-CM | POA: Diagnosis not present

## 2024-02-10 DIAGNOSIS — Z131 Encounter for screening for diabetes mellitus: Secondary | ICD-10-CM | POA: Diagnosis not present

## 2024-02-10 DIAGNOSIS — E611 Iron deficiency: Secondary | ICD-10-CM | POA: Diagnosis not present

## 2024-02-10 DIAGNOSIS — R5383 Other fatigue: Secondary | ICD-10-CM | POA: Diagnosis not present

## 2024-02-10 DIAGNOSIS — E039 Hypothyroidism, unspecified: Secondary | ICD-10-CM | POA: Diagnosis not present

## 2024-02-10 DIAGNOSIS — E669 Obesity, unspecified: Secondary | ICD-10-CM | POA: Diagnosis not present

## 2024-02-15 ENCOUNTER — Encounter: Payer: Self-pay | Admitting: Nurse Practitioner

## 2024-02-15 ENCOUNTER — Ambulatory Visit: Admitting: Obstetrics and Gynecology

## 2024-02-15 ENCOUNTER — Ambulatory Visit: Payer: Federal, State, Local not specified - PPO | Admitting: Nurse Practitioner

## 2024-02-15 VITALS — BP 126/72 | HR 77 | Temp 98.8°F | Ht 67.0 in | Wt 313.8 lb

## 2024-02-15 DIAGNOSIS — F419 Anxiety disorder, unspecified: Secondary | ICD-10-CM | POA: Diagnosis not present

## 2024-02-15 DIAGNOSIS — I10 Essential (primary) hypertension: Secondary | ICD-10-CM

## 2024-02-15 DIAGNOSIS — Z6841 Body Mass Index (BMI) 40.0 and over, adult: Secondary | ICD-10-CM

## 2024-02-15 DIAGNOSIS — L659 Nonscarring hair loss, unspecified: Secondary | ICD-10-CM | POA: Diagnosis not present

## 2024-02-15 DIAGNOSIS — F32A Depression, unspecified: Secondary | ICD-10-CM

## 2024-02-15 MED ORDER — ZEPBOUND 2.5 MG/0.5ML ~~LOC~~ SOAJ: 2.5000 mg | SUBCUTANEOUS | 0 refills | Status: DC

## 2024-02-15 MED ORDER — ZEPBOUND 5 MG/0.5ML ~~LOC~~ SOAJ: 5.0000 mg | SUBCUTANEOUS | 0 refills | Status: DC

## 2024-02-15 NOTE — Assessment & Plan Note (Signed)
 She has long-standing alopecia managed by Dermatology. No new medications are prescribed due to her history of pulmonary embolism. Continue follow up with Dermatology.

## 2024-02-15 NOTE — Assessment & Plan Note (Signed)
 Her hypertension is well-controlled on Norvasc, though she occasionally experiences ankle swelling, possibly from Norvasc. Continue Norvasc 10 mg daily. Monitor for ankle swelling and consider dose adjustment if symptoms persist.

## 2024-02-15 NOTE — Progress Notes (Signed)
 Bluford Burkitt, NP-C Phone: [phone]  Bridget Gould is a 48 y.o. female who presents today for transfer of care.   Discussed the use of AI scribe software for clinical note transcription with the patient, who gave verbal consent to proceed.  History of Present Illness   Bridget Gould is a 48 year old female who presents with concerns about weight gain.  She has a history of hypertension, managed with Norvasc (amlodipine) 10 mg daily. She experiences occasional ankle swelling, which she attributes to her medication, though it is not a daily occurrence. She monitors her salt intake but has not identified a clear pattern related to the swelling.  She has a history of anxiety and depression, managed with Zoloft 50 mg daily and hydroxyzine as needed. Her mood is generally stable with medication, although recent job-related stress has caused some fluctuations.  Her primary concern is weight gain despite regular exercise and a heart-healthy diet. She exercises five to six times a week, including [employer] classes, elliptical workouts, and walking. Her diet is high in protein and vegetables, with minimal red meat and no alcohol since New Year's Eve. Despite these efforts, her weight continues to increase.  She previously tried [place] for weight loss in 2023, losing 40 pounds before plateauing and discontinuing the medication in early 2024. She has since regained the weight and more, currently weighing 313 pounds, with her lowest weight on Wegovy being 262 pounds.  Her family history is significant for her husband's two heart attacks at a young age, which influences her heart-healthy lifestyle choices.  She also has a history of alopecia since 2006, managed with wigs and hats, and has opted to go natural. She cannot take the newest dermatological medication due to a past pulmonary embolism.  No chest pain, shortness of breath, dizziness, or significant swelling. Occasional ankle swelling.       Social History   Tobacco Use  Smoking Status Never  Smokeless Tobacco Never    Current Outpatient Medications on File Prior to Visit  Medication Sig Dispense Refill   amLODipine (NORVASC) 10 MG tablet Take 1 tablet (10 mg total) by mouth daily. 90 tablet 0   calcipotriene (DOVONOX) 0.005 % cream APPLY TOPICALLY TWICE A DAY 60 g 1   clobetasol (OLUX) 0.05 % topical foam APPLY TO AFFECTED SKIN TWICE A DAY AS NEEDED FOR ITCH AS DIRECTED. AVOID APPLYING TO FACE, GROIN, AND AXILLA. LONG-TERM USE CAN CAUSE THINNING OF THE SKIN. 50 g 2   clobetasol cream (TEMOVATE) 0.05 % Apply thin layer to aa's at left posterior shoulder bid for 2wks. If rash still persist can use qd after 2 weeks. Avoid applying to face, groin, and axilla. Use as directed. 30 g 0   fluticasone (FLONASE) 50 MCG/ACT nasal spray Place 2 sprays into both nostrils daily. 16 g 6   hydrOXYzine (ATARAX) 10 MG tablet Take 1 tablet (10 mg total) by mouth 3 (three) times daily as needed. 30 tablet 0   norethindrone (MICRONOR) 0.35 MG tablet Take 1 tablet (0.35 mg total) by mouth daily. 84 tablet 3   Ruxolitinib Phosphate (OPZELURA) 1.5 % CREA Apply twice daily as directed 60 g 2   sertraline (ZOLOFT) 50 MG tablet Take 1 tablet (50 mg total) by mouth daily. 90 tablet 2   sulfamethoxazole-trimethoprim (BACTRIM,SEPTRA) 400-80 MG tablet Take 0.5 tablets by mouth daily.  1   No current facility-administered medications on file prior to visit.     ROS see history of present illness  Objective  Physical Exam Vitals:   02/15/24 1524  BP: 126/72  Pulse: 77  Temp: 98.8 F (37.1 C)  SpO2: 98%    BP Readings from Last 3 Encounters:  02/15/24 126/72  10/29/23 128/72  04/23/23 116/74   Wt Readings from Last 3 Encounters:  02/15/24 (!) 313 lb 12.8 oz (142.3 kg)  10/29/23 (!) 307 lb 9.6 oz (139.5 kg)  04/23/23 279 lb 12.8 oz (126.9 kg)    Physical Exam Constitutional:      General: She is not in acute distress.     Appearance: Normal appearance. She is obese.  HENT:     Head: Normocephalic.  Cardiovascular:     Rate and Rhythm: Normal rate and regular rhythm.     Heart sounds: Normal heart sounds.  Pulmonary:     Effort: Pulmonary effort is normal.     Breath sounds: Normal breath sounds.  Skin:    General: Skin is warm and dry.  Neurological:     General: No focal deficit present.     Mental Status: She is alert.  Psychiatric:        Mood and Affect: Mood normal.        Behavior: Behavior normal.     Assessment/Plan: Please see individual problem list.  Morbid obesity with BMI of 45.0-49.9, adult (HCC) Assessment & Plan: She has experienced significant weight gain despite exercise and diet. Previously, she lost weight with WJXBJY. We discussed Zepbound pending insurance approval and will consider Wegovy or semaglutide injections if not covered. Start Zepbound 2.5 mg weekly x 4 weeks then increase to 5 mg weekly. Counseled on the risk of pancreatitis and gallbladder disease. Discussed the risk of nausea. Advised to discontinue the Zepbound and contact us immediately if they develop abdominal pain. If they develop excessive nausea they will contact us right away. I discussed that medullary thyroid cancer has been seen in rats studies. The patient confirmed no personal or family history of thyroid cancer, parathyroid cancer, or adrenal gland cancer. Discussed that we thus far have not seen medullary thyroid cancer result from use of this type of medication in humans. Advised to monitor the thyroid area and contact us for any lumps, swelling, trouble swallowing, or any other changes in this area.  Discussed goal weight loss of 1 to 2 pounds a week while on this medication. Discussed the importance of healthy diet, exercise and lifestyle modifications even with this medication. Follow-up in six weeks to assess progress and medication tolerance. Counsel on maintaining a nutritious diet with adequate protein  and regular exercise.   Orders: -     Zepbound; Inject 2.5 mg into the skin once a week.  Dispense: 2 mL; Refill: 0 -     Zepbound; Inject 5 mg into the skin once a week.  Dispense: 2 mL; Refill: 0  Hypertension, unspecified type Assessment & Plan: Her hypertension is well-controlled on Norvasc, though she occasionally experiences ankle swelling, possibly from Norvasc. Continue Norvasc 10 mg daily. Monitor for ankle swelling and consider dose adjustment if symptoms persist.   Anxiety and depression Assessment & Plan: Her anxiety and depression are managed with Zoloft and hydroxyzine. Her mood remains stable despite job stress. Continue Zoloft 50 mg daily. Use hydroxyzine as needed for anxiety.   Alopecia Assessment & Plan: She has long-standing alopecia managed by Dermatology. No new medications are prescribed due to her history of pulmonary embolism. Continue follow up with Dermatology.     Return in about 6 weeks (  around 03/28/2024) for Follow up.   Bluford Burkitt, NP-C [facility]

## 2024-02-15 NOTE — Assessment & Plan Note (Signed)
 She has experienced significant weight gain despite exercise and diet. Previously, she lost weight with ZOXWRU. We discussed Zepbound pending insurance approval and will consider Wegovy or semaglutide injections if not covered. Start Zepbound 2.5 mg weekly x 4 weeks then increase to 5 mg weekly. Counseled on the risk of pancreatitis and gallbladder disease. Discussed the risk of nausea. Advised to discontinue the Zepbound and contact us  immediately if they develop abdominal pain. If they develop excessive nausea they will contact us  right away. I discussed that medullary thyroid cancer has been seen in rats studies. The patient confirmed no personal or family history of thyroid cancer, parathyroid cancer, or adrenal gland cancer. Discussed that we thus far have not seen medullary thyroid cancer result from use of this type of medication in humans. Advised to monitor the thyroid area and contact us  for any lumps, swelling, trouble swallowing, or any other changes in this area.  Discussed goal weight loss of 1 to 2 pounds a week while on this medication. Discussed the importance of healthy diet, exercise and lifestyle modifications even with this medication. Follow-up in six weeks to assess progress and medication tolerance. Counsel on maintaining a nutritious diet with adequate protein and regular exercise.

## 2024-02-15 NOTE — Assessment & Plan Note (Signed)
 Her anxiety and depression are managed with Zoloft and hydroxyzine. Her mood remains stable despite job stress. Continue Zoloft 50 mg daily. Use hydroxyzine as needed for anxiety.

## 2024-02-16 ENCOUNTER — Other Ambulatory Visit (HOSPITAL_COMMUNITY): Payer: Self-pay

## 2024-02-16 ENCOUNTER — Telehealth: Payer: Self-pay | Admitting: Pharmacy Technician

## 2024-02-16 ENCOUNTER — Telehealth: Payer: Self-pay

## 2024-02-16 NOTE — Telephone Encounter (Signed)
 Pt informed via mychart

## 2024-02-16 NOTE — Telephone Encounter (Signed)
 Pharmacy Patient Advocate Encounter   Received notification from CoverMyMeds that prior authorization for ZEPBOUND 2.5 MG is required/requested.   Insurance verification completed.   The patient is insured through [facility] .   Per test claim: PA required; PA started via CoverMyMeds. KEY [id number] . Please see clinical question(s) below that I am not finding the answer to in her chart and advise.      It looks like Bridget Gould is the preferred but it will still need a PA, her Zepbound PA was recently denied, please advise. Thanks!!

## 2024-02-16 NOTE — Telephone Encounter (Signed)
 PA request has been Submitted. New Encounter has been or will be created for follow up. For additional info see Pharmacy Prior Auth telephone encounter from 02/16/24.

## 2024-02-16 NOTE — Telephone Encounter (Signed)
 Pharmacy Patient Advocate Encounter   Received notification from Pt Calls Messages that prior authorization for Wegovy 0.25MG /0.5ML auto-injectors is required/requested.   Insurance verification completed.   The patient is insured through [employer] .   Per test claim: PA required and submitted KEY/EOC/Request #: [id number] APPROVED from 01/17/24 to 08/14/24. Ran test claim, Copay is $641.91. This test claim was processed through [facility]- copay amounts may vary at other pharmacies due to pharmacy/plan contracts, or as the patient moves through the different stages of their insurance plan.

## 2024-02-17 ENCOUNTER — Other Ambulatory Visit: Payer: Self-pay

## 2024-02-17 ENCOUNTER — Other Ambulatory Visit (HOSPITAL_COMMUNITY): Payer: Self-pay

## 2024-02-17 ENCOUNTER — Other Ambulatory Visit: Payer: Self-pay | Admitting: Nurse Practitioner

## 2024-02-17 MED ORDER — WEGOVY 0.5 MG/0.5ML ~~LOC~~ SOAJ
0.5000 mg | SUBCUTANEOUS | 0 refills | Status: DC
  Filled 2024-02-17: qty 2, fill #0

## 2024-02-17 MED ORDER — WEGOVY 0.25 MG/0.5ML ~~LOC~~ SOAJ
0.2500 mg | SUBCUTANEOUS | 0 refills | Status: DC
  Filled 2024-02-17 – 2024-02-23 (×2): qty 2, 28d supply, fill #0

## 2024-02-18 ENCOUNTER — Other Ambulatory Visit: Payer: Self-pay

## 2024-02-23 ENCOUNTER — Other Ambulatory Visit (HOSPITAL_COMMUNITY): Payer: Self-pay

## 2024-02-23 MED ORDER — WEGOVY 0.25 MG/0.5ML ~~LOC~~ SOAJ: 0.2500 mg | SUBCUTANEOUS | 0 refills | Status: DC

## 2024-02-23 MED ORDER — WEGOVY 0.5 MG/0.5ML ~~LOC~~ SOAJ: 0.5000 mg | SUBCUTANEOUS | 0 refills | Status: DC

## 2024-02-25 ENCOUNTER — Ambulatory Visit: Admitting: Obstetrics and Gynecology

## 2024-02-25 ENCOUNTER — Encounter: Payer: Self-pay | Admitting: Obstetrics and Gynecology

## 2024-02-25 VITALS — BP 128/74 | HR 79 | Ht 67.0 in | Wt 319.0 lb

## 2024-02-25 DIAGNOSIS — Z1231 Encounter for screening mammogram for malignant neoplasm of breast: Secondary | ICD-10-CM

## 2024-02-25 DIAGNOSIS — Z3041 Encounter for surveillance of contraceptive pills: Secondary | ICD-10-CM

## 2024-02-25 DIAGNOSIS — N814 Uterovaginal prolapse, unspecified: Secondary | ICD-10-CM | POA: Insufficient documentation

## 2024-02-25 DIAGNOSIS — Z01419 Encounter for gynecological examination (general) (routine) without abnormal findings: Secondary | ICD-10-CM

## 2024-02-25 MED ORDER — NORETHINDRONE 0.35 MG PO TABS: 1.0000 | ORAL_TABLET | Freq: Every day | ORAL | 3 refills | Status: AC

## 2024-02-25 NOTE — Patient Instructions (Signed)
 I value your feedback and you entrusting [place] with your care. If you get a [place] patient survey, I would appreciate you taking the time to let us know about your experience today. Thank you! ? ? ?

## 2024-02-25 NOTE — Progress Notes (Signed)
 PCP:  Bluford Burkitt, NP   Chief Complaint  Patient presents with   Gynecologic Exam    No concerns    HPI:      [name] is a 48 y.o. G0P0000 who LMP was Patient's last menstrual period was 02/11/2024 (exact date)., presents today for her annual examination.  Her menses are regular every 28-30 days, lasting 5-6 days, light to mod flow.  Dysmenorrhea none, no BTB. On POPs.  Sex activity: single partner, contraception - oral progesterone-only contraceptive. No pain/bleeding/dryness. Has mild uterine prolapse. Pt denies feeling bulge/any sx. Hx of urinary retention with self-cath in past but stopped that ~10 yrs ago; voids better on her own now.  Last Pap: 12/08/21 Results were: no abnormalities /neg HPV DNA  Hx of STDs: none  Mammogram: 12/13/23  Results were normal, repeat in 12 months.  There is a FH of breast cancer in her mat aunt, who is BRCA/gene panel neg 2018. Pt doesn't qualify for cancer genetic testing due to age of dx of mat aunt. There is no FH of ovarian cancer. The patient does  self-breast exams.  Tobacco use: The patient denies current or previous tobacco use. Alcohol use: social drinker No drug use.  Exercise: very active  She does get adequate calcium and some Vitamin D in her diet.  She is on sertraline  now for anxiety/stress, managed with PCP. Weaned off effexor  due to possible worsening of HTN.   Labs with PCP. HTN  improved.  Diagnosed with lichen sclerosus (not in genital area) and morphea with derm recently.   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   History of cholecystectomy 10/28/2018   Hyperlipidemia    Hypertension    Neurogenic bladder    Neurogenic bladder    Pulmonary embolism (HCC) 08/2015   Urinary tract  infection    Urine incontinence     Past Surgical History:  Procedure Laterality Date   APPENDECTOMY  07/2018   CHOLECYSTECTOMY  06/2018   COLONOSCOPY WITH PROPOFOL  N/A 11/23/2022   Procedure: COLONOSCOPY WITH PROPOFOL ;  Surgeon: Luke Salaam, MD;  Location: [facility] ENDOSCOPY;  Service: Gastroenterology;  Laterality: N/A;   DILATION AND CURETTAGE OF UTERUS     tumor  1978/2004   Base of spinal cord   tumor removed      Family History  Problem Relation Age of Onset   Hyperlipidemia Mother    Arthritis Father    Hyperlipidemia Father    Heart disease Father    Hypertension Father    Cancer Maternal Grandfather        Colon/Prostate Cancer   Diabetes Paternal Grandfather    Alcohol abuse Sister    Drug abuse Sister    Mental illness Sister        BiPolar   Cancer Maternal Aunt        Breast Cancer   Breast cancer Maternal Aunt 57    Social History   Socioeconomic History   Marital status: Married    Spouse name: Not on file   Number of children: Not on file   Years of education: Not on file   Highest education level: Bachelor's degree (e.g., BA, AB, BS)  Occupational History   Not on file  Tobacco Use   Smoking status: Never   Smokeless tobacco: Never  Vaping Use   Vaping status: Never Used  Substance and Sexual Activity   Alcohol use: Yes    Alcohol/week: 0.0 standard drinks of alcohol    Comment: 1 drink every other week    Drug use: No   Sexual activity: Yes    Partners: Male    Birth control/protection: Pill    Comment: Husband   Other Topics Concern   Not on file  Social History Narrative   Married   Employed    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    No children   5 dogs   Caffeine- Coffee 1 cup daily, tea- depends, soda every other day   Social Drivers of Health   Financial Resource Strain: Low Risk  (10/28/2023)   Overall Financial Resource Strain (CARDIA)    Difficulty of Paying Living Expenses: Not hard at  all  Food Insecurity: No Food Insecurity (10/28/2023)   Hunger Vital Sign    Worried About Running Out of Food in the Last Year: Never true    Ran Out of Food in the Last Year: Never true  Transportation Needs: No Transportation Needs (10/28/2023)   PRAPARE - Administrator, Civil Service (Medical): No    Lack of Transportation (Non-Medical): No  Physical Activity: Sufficiently Active (10/28/2023)   Exercise Vital Sign    Days of Exercise per Week: 5 days    Minutes of Exercise per Session: 30 min  Stress: No Stress Concern Present (10/28/2023)   [employer] of Occupational Health - Occupational Stress Questionnaire    Feeling of Stress : Only a little  Social Connections: Socially Integrated (10/28/2023)   Social Connection and Isolation Panel [NHANES]    Frequency of Communication with Friends and Family: More than three times a week    Frequency of Social Gatherings with Friends and Family: Once a week    Attends Religious Services: More than 4 times per year    Active Member of [employer] or Organizations: Yes    Attends Engineer, structural: More than 4 times per year    Marital Status: Married  Catering manager Violence: Not on file    Current Meds  Medication Sig   amLODipine  (NORVASC ) 10 MG tablet Take 1 tablet (10 mg total) by mouth daily.   calcipotriene  (DOVONOX) 0.005 % cream APPLY TOPICALLY TWICE A DAY   clobetasol  (OLUX ) 0.05 % topical foam APPLY TO AFFECTED SKIN TWICE A DAY AS NEEDED FOR ITCH AS DIRECTED. AVOID APPLYING TO FACE, GROIN, AND AXILLA. LONG-TERM USE CAN CAUSE THINNING OF THE SKIN.   clobetasol  cream (TEMOVATE ) 0.05 % Apply thin layer to aa's at left posterior shoulder bid for 2wks. If rash still persist can use qd after 2 weeks. Avoid applying to face, groin, and axilla. Use as directed.   fluticasone  (FLONASE ) 50 MCG/ACT nasal spray Place 2 sprays into both nostrils daily.   hydrocortisone  2.5 % cream Apply topically.   hydrOXYzine   (ATARAX ) 10 MG tablet Take 1 tablet (10 mg total) by mouth 3 (three) times daily as needed.   Ruxolitinib Phosphate  (OPZELURA ) 1.5 % CREA Apply twice daily as directed   Semaglutide -Weight Management (WEGOVY ) 0.25 MG/0.5ML SOAJ Inject 0.25 mg into the skin once a week.   Semaglutide -Weight Management (WEGOVY ) 0.5 MG/0.5ML SOAJ Inject 0.5 mg into the skin once a week.   sertraline  (ZOLOFT ) 50 MG tablet Take  1 tablet (50 mg total) by mouth daily.   sulfamethoxazole-trimethoprim (BACTRIM,SEPTRA) 400-80 MG tablet Take 0.5 tablets by mouth daily.   [DISCONTINUED] norethindrone  (MICRONOR ) 0.35 MG tablet Take 1 tablet (0.35 mg total) by mouth daily.     ROS:  Review of Systems  Constitutional:  Negative for fatigue, fever and unexpected weight change.  Respiratory:  Negative for cough, shortness of breath and wheezing.   Cardiovascular:  Negative for chest pain, palpitations and leg swelling.  Gastrointestinal:  Negative for blood in stool, constipation, diarrhea, nausea and vomiting.  Endocrine: Negative for cold intolerance, heat intolerance and polyuria.  Genitourinary:  Negative for dyspareunia, dysuria, flank pain, frequency, genital sores, hematuria, menstrual problem, pelvic pain, urgency, vaginal bleeding, vaginal discharge and vaginal pain.  Musculoskeletal:  Negative for back pain, joint swelling and myalgias.  Skin:  Negative for rash.  Neurological:  Negative for dizziness, syncope, light-headedness, numbness and headaches.  Hematological:  Negative for adenopathy.  Psychiatric/Behavioral:  Negative for agitation, confusion, sleep disturbance and suicidal ideas. The patient is not nervous/anxious.      Objective: BP 128/74   Pulse 79   Ht 5\' 7"  (1.702 m)   Wt (!) 319 lb (144.7 kg)   LMP 02/11/2024 (Exact Date)   BMI 49.96 kg/m    Physical Exam Constitutional:      Appearance: She is well-developed.  Genitourinary:     Vulva normal.     Genitourinary Comments: MILD  UTERINE PROLAPSE     Right Labia: No rash, tenderness or lesions.    Left Labia: No tenderness, lesions or rash.    No vaginal discharge, erythema or tenderness.      Right Adnexa: not tender and no mass present.    Left Adnexa: not tender and no mass present.    No cervical motion tenderness, friability or polyp.     Uterus is not enlarged or tender.  Breasts:    Right: No mass, nipple discharge, skin change or tenderness.     Left: No mass, nipple discharge, skin change or tenderness.  Neck:     Thyroid : No thyromegaly.  Cardiovascular:     Rate and Rhythm: Normal rate and regular rhythm.     Heart sounds: Normal heart sounds. No murmur heard. Pulmonary:     Effort: Pulmonary effort is normal.     Breath sounds: Normal breath sounds.  Abdominal:     Palpations: Abdomen is soft.     Tenderness: There is no abdominal tenderness. There is no guarding or rebound.  Musculoskeletal:        General: Normal range of motion.     Cervical back: Normal range of motion.  Lymphadenopathy:     Cervical: No cervical adenopathy.  Neurological:     General: No focal deficit present.     Mental Status: She is alert and oriented to person, place, and time.     Cranial Nerves: No cranial nerve deficit.  Skin:    General: Skin is warm and dry.  Psychiatric:        Mood and Affect: Mood normal.        Behavior: Behavior normal.        Thought Content: Thought content normal.        Judgment: Judgment normal.  Vitals reviewed.     Assessment/Plan: Encounter for annual routine gynecological examination  Encounter for surveillance of contraceptive pills - Plan: norethindrone  (MICRONOR ) 0.35 MG tablet; Rx RF eRxd.   Encounter for screening mammogram for malignant neoplasm  of breast--pt current on mammo  Uterine prolapse--stage 1; discussed pelvic floor exercises, avoid heavy lifting/valsalva. Pt without sx. (Hx of urinary retention with self-catherization in past but voids better now on her  own).    Meds ordered this encounter  Medications   norethindrone  (MICRONOR ) 0.35 MG tablet    Sig: Take 1 tablet (0.35 mg total) by mouth daily.    Dispense:  84 tablet    Refill:  3    Supervising Provider:   ROBY, MICIA [[id number]]            GYN counsel mammography screening, adequate intake of calcium and vitamin D, diet and exercise     F/U  Return in about 1 year (around 02/24/2025).  Bridget Brunelle B. Irlanda Croghan, PA-C 02/25/2024 4:16 PM

## 2024-03-01 ENCOUNTER — Encounter: Payer: Self-pay | Admitting: Nurse Practitioner

## 2024-03-10 ENCOUNTER — Telehealth: Payer: Self-pay

## 2024-03-10 DIAGNOSIS — I10 Essential (primary) hypertension: Secondary | ICD-10-CM

## 2024-03-10 NOTE — Telephone Encounter (Signed)
 Pharmacy Patient Advocate Encounter  Received notification from [facility] that Prior Authorization for Zepbound  has been DENIED.  Full denial letter will be uploaded to the media tab. See denial reason below.

## 2024-03-22 DIAGNOSIS — E039 Hypothyroidism, unspecified: Secondary | ICD-10-CM | POA: Diagnosis not present

## 2024-03-22 DIAGNOSIS — E611 Iron deficiency: Secondary | ICD-10-CM | POA: Diagnosis not present

## 2024-03-22 DIAGNOSIS — R5383 Other fatigue: Secondary | ICD-10-CM | POA: Diagnosis not present

## 2024-03-22 DIAGNOSIS — N959 Unspecified menopausal and perimenopausal disorder: Secondary | ICD-10-CM | POA: Diagnosis not present

## 2024-03-23 ENCOUNTER — Encounter: Payer: Self-pay | Admitting: Nurse Practitioner

## 2024-03-31 ENCOUNTER — Ambulatory Visit: Admitting: Nurse Practitioner

## 2024-04-13 DIAGNOSIS — R300 Dysuria: Secondary | ICD-10-CM | POA: Diagnosis not present

## 2024-04-13 DIAGNOSIS — R109 Unspecified abdominal pain: Secondary | ICD-10-CM | POA: Diagnosis not present

## 2024-04-13 DIAGNOSIS — N390 Urinary tract infection, site not specified: Secondary | ICD-10-CM | POA: Diagnosis not present

## 2024-04-24 MED ORDER — AMLODIPINE BESYLATE 10 MG PO TABS: 10.0000 mg | ORAL_TABLET | Freq: Every day | ORAL | 3 refills | Status: AC

## 2024-05-02 DIAGNOSIS — N390 Urinary tract infection, site not specified: Secondary | ICD-10-CM | POA: Diagnosis not present

## 2024-05-02 DIAGNOSIS — B9689 Other specified bacterial agents as the cause of diseases classified elsewhere: Secondary | ICD-10-CM | POA: Diagnosis not present

## 2024-05-02 DIAGNOSIS — R339 Retention of urine, unspecified: Secondary | ICD-10-CM | POA: Diagnosis not present

## 2024-05-18 ENCOUNTER — Other Ambulatory Visit: Payer: Self-pay

## 2024-05-18 DIAGNOSIS — J309 Allergic rhinitis, unspecified: Secondary | ICD-10-CM

## 2024-05-18 MED ORDER — FLUTICASONE PROPIONATE 50 MCG/ACT NA SUSP: 2.0000 | Freq: Every day | NASAL | 6 refills | Status: DC

## 2024-06-09 DIAGNOSIS — F419 Anxiety disorder, unspecified: Secondary | ICD-10-CM | POA: Diagnosis not present

## 2024-06-09 DIAGNOSIS — E611 Iron deficiency: Secondary | ICD-10-CM | POA: Diagnosis not present

## 2024-06-09 DIAGNOSIS — Z131 Encounter for screening for diabetes mellitus: Secondary | ICD-10-CM | POA: Diagnosis not present

## 2024-06-09 DIAGNOSIS — E785 Hyperlipidemia, unspecified: Secondary | ICD-10-CM | POA: Diagnosis not present

## 2024-06-09 DIAGNOSIS — N959 Unspecified menopausal and perimenopausal disorder: Secondary | ICD-10-CM | POA: Diagnosis not present

## 2024-06-09 DIAGNOSIS — E039 Hypothyroidism, unspecified: Secondary | ICD-10-CM | POA: Diagnosis not present

## 2024-06-14 DIAGNOSIS — N390 Urinary tract infection, site not specified: Secondary | ICD-10-CM | POA: Diagnosis not present

## 2024-06-14 DIAGNOSIS — N3020 Other chronic cystitis without hematuria: Secondary | ICD-10-CM | POA: Diagnosis not present

## 2024-06-20 ENCOUNTER — Telehealth: Payer: Self-pay | Admitting: Nurse Practitioner

## 2024-06-20 NOTE — Telephone Encounter (Signed)
 Left phone message and sent MyChart message: Your appointment with Bridget Gould on 08/22/2024 needs to be rescheduled, provider is out that day. Please call the office to reschedule or reschedule through your MyChart.  Thank you.

## 2024-06-22 DIAGNOSIS — N959 Unspecified menopausal and perimenopausal disorder: Secondary | ICD-10-CM | POA: Diagnosis not present

## 2024-06-22 DIAGNOSIS — E611 Iron deficiency: Secondary | ICD-10-CM | POA: Diagnosis not present

## 2024-06-22 DIAGNOSIS — R5383 Other fatigue: Secondary | ICD-10-CM | POA: Diagnosis not present

## 2024-06-22 DIAGNOSIS — E039 Hypothyroidism, unspecified: Secondary | ICD-10-CM | POA: Diagnosis not present

## 2024-08-10 ENCOUNTER — Ambulatory Visit: Payer: Federal, State, Local not specified - PPO | Admitting: Dermatology

## 2024-08-17 ENCOUNTER — Other Ambulatory Visit: Payer: Self-pay

## 2024-08-17 DIAGNOSIS — F32A Depression, unspecified: Secondary | ICD-10-CM

## 2024-08-17 MED ORDER — SERTRALINE HCL 50 MG PO TABS: 50.0000 mg | ORAL_TABLET | Freq: Every day | ORAL | 3 refills | Status: AC

## 2024-08-22 ENCOUNTER — Other Ambulatory Visit (HOSPITAL_COMMUNITY): Payer: Self-pay

## 2024-08-22 ENCOUNTER — Encounter: Admitting: Nurse Practitioner

## 2024-08-23 ENCOUNTER — Other Ambulatory Visit (HOSPITAL_COMMUNITY): Payer: Self-pay

## 2024-09-11 ENCOUNTER — Encounter: Payer: Self-pay | Admitting: Dermatology

## 2024-09-11 ENCOUNTER — Ambulatory Visit: Admitting: Dermatology

## 2024-09-11 DIAGNOSIS — L578 Other skin changes due to chronic exposure to nonionizing radiation: Secondary | ICD-10-CM | POA: Diagnosis not present

## 2024-09-11 DIAGNOSIS — L900 Lichen sclerosus et atrophicus: Secondary | ICD-10-CM | POA: Diagnosis not present

## 2024-09-11 DIAGNOSIS — W908XXA Exposure to other nonionizing radiation, initial encounter: Secondary | ICD-10-CM | POA: Diagnosis not present

## 2024-09-11 DIAGNOSIS — L639 Alopecia areata, unspecified: Secondary | ICD-10-CM | POA: Diagnosis not present

## 2024-09-11 DIAGNOSIS — L814 Other melanin hyperpigmentation: Secondary | ICD-10-CM

## 2024-09-11 DIAGNOSIS — L910 Hypertrophic scar: Secondary | ICD-10-CM

## 2024-09-11 DIAGNOSIS — R21 Rash and other nonspecific skin eruption: Secondary | ICD-10-CM

## 2024-09-11 MED ORDER — OPZELURA 1.5 % EX CREA: TOPICAL_CREAM | CUTANEOUS | 2 refills | Status: AC

## 2024-09-11 MED ORDER — CALCIPOTRIENE 0.005 % EX CREA: TOPICAL_CREAM | Freq: Two times a day (BID) | CUTANEOUS | 5 refills | Status: AC

## 2024-09-11 MED ORDER — CLOBETASOL PROPIONATE 0.05 % EX FOAM: CUTANEOUS | 2 refills | Status: DC

## 2024-09-11 NOTE — Patient Instructions (Signed)

## 2024-09-11 NOTE — Progress Notes (Signed)
 Follow-Up Visit   Subjective  Bridget Gould is a 48 y.o. female who presents for the following: 1 year lichen sclerosus at upper back and left forearm. Patient thinks it could have spread to left hand and lower left back. Uses calcipotriene , Opzelura  and clobetasol  prn.    The following portions of the chart were reviewed this encounter and updated as appropriate: medications, allergies, medical history  Review of Systems:  No other skin or systemic complaints except as noted in HPI or Assessment and Plan.  Objective  Well appearing patient in no apparent distress; mood and affect are within normal limits.   A focused examination was performed of the following areas: Back, hand, arms  Relevant exam findings are noted in the Assessment and Plan.  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 Hypertrophic scar at biopsy site of back lesion. Few small focal atrophic white papules L lower quadrant of hyperpigmented area of upper back lesion.      Chronic condition with duration or expected duration over one year. Currently well-controlled.   Continue Calcipotriene  cream twice daily on upper back Continue Opzelura  twice daily on upper back and left arm  Continue clobetasol  twice daily for shortest needed duration (up to 2 weeks) as needed for flares. Avoid applying to face, groin, and axilla. Use as directed. Long-term use can cause thinning of the skin.   Topical steroids (such as triamcinolone , fluocinolone, fluocinonide, mometasone, clobetasol , halobetasol, betamethasone, hydrocortisone ) can cause thinning and lightening of the skin if they are used for too long in the same area. Your physician has selected the right strength medicine for your problem and area affected on the body.  Please use your medication only as directed by your physician to prevent side effects.  Alopecia areata scalp   Scattered few depigmented hairs on scalp. Otherwise diffuse alopecia of head Universalis, chronic persistent autoimmune condition, currently no treatment   Hx of pulmonary embolism and has increased risk of blood clots.  No longer taking blood thinners.  ACTINIC DAMAGE - chronic, secondary to cumulative UV radiation exposure/sun exposure over time on scalp - diffuse scaly erythematous macules with underlying dyspigmentation - Recommend daily broad spectrum sunscreen SPF 30+ to sun-exposed areas, reapply every 2 hours as needed.  - Recommend staying in the shade or wearing long sleeves, sun glasses (UVA+UVB protection) and wide brim hats (4-inch brim around the entire circumference of the hat). - Call for new or changing lesions.   LENTIGINES Exam: scattered tan macules on scalp Due to sun exposure Treatment Plan: Benign-appearing, observe. Recommend daily broad spectrum sunscreen SPF 30+ to sun-exposed areas, reapply every 2 hours as needed.  Call for any changes.  RASH   Related Medications calcipotriene  (DOVONOX) 0.005 % cream Apply topically 2 (two) times daily. RASH AND OTHER NONSPECIFIC SKIN ERUPTION   Related Medications clobetasol  cream (TEMOVATE ) 0.05 % Apply thin layer to aa's at left posterior shoulder bid for 2wks. If rash still persist can use qd after 2 weeks. Avoid applying to face, groin, and axilla. Use as directed. LICHEN SCLEROSUS ET ATROPHICUS   Related Medications clobetasol  (OLUX ) 0.05 % topical foam APPLY TO AFFECTED SKIN TWICE A DAY AS NEEDED FOR ITCH AS DIRECTED. AVOID APPLYING TO FACE, GROIN, AND AXILLA. LONG-TERM USE CAN CAUSE THINNING OF THE SKIN. LENTIGINES  ACTINIC ELASTOSIS   HYPERTROPHIC SCAR Left Upper Back Offered ILK. Patient defers  Return in about 1 year (around 09/11/2025) for lichen sclerosus , with Dr.  Claudene.  LILLETTE Lonell Drones, RMA, am acting as scribe for Boneta Claudene, MD .   Documentation: I have reviewed the above documentation for accuracy and completeness, and I agree with the above.  Boneta Claudene, MD

## 2024-09-19 ENCOUNTER — Ambulatory Visit
Admission: RE | Admit: 2024-09-19 | Discharge: 2024-09-19 | Disposition: A | Discharge status: Home / Self Care | Attending: Nurse Practitioner | Admitting: Nurse Practitioner

## 2024-09-19 ENCOUNTER — Ambulatory Visit
Admission: RE | Admit: 2024-09-19 | Discharge: 2024-09-19 | Disposition: A | Discharge status: Home / Self Care | Source: Ambulatory Visit | Attending: Nurse Practitioner | Admitting: Nurse Practitioner

## 2024-09-19 ENCOUNTER — Ambulatory Visit: Admitting: Nurse Practitioner

## 2024-09-19 VITALS — BP 124/80 | HR 68 | Temp 99.0°F | Ht 67.0 in | Wt 295.0 lb

## 2024-09-19 DIAGNOSIS — M25511 Pain in right shoulder: Secondary | ICD-10-CM | POA: Insufficient documentation

## 2024-09-19 DIAGNOSIS — M19011 Primary osteoarthritis, right shoulder: Secondary | ICD-10-CM | POA: Diagnosis not present

## 2024-09-19 DIAGNOSIS — Z23 Encounter for immunization: Secondary | ICD-10-CM | POA: Diagnosis not present

## 2024-09-19 DIAGNOSIS — F419 Anxiety disorder, unspecified: Secondary | ICD-10-CM | POA: Diagnosis not present

## 2024-09-19 DIAGNOSIS — F32A Depression, unspecified: Secondary | ICD-10-CM

## 2024-09-19 DIAGNOSIS — I10 Essential (primary) hypertension: Secondary | ICD-10-CM

## 2024-09-19 DIAGNOSIS — E785 Hyperlipidemia, unspecified: Secondary | ICD-10-CM

## 2024-09-19 DIAGNOSIS — Z6841 Body Mass Index (BMI) 40.0 and over, adult: Secondary | ICD-10-CM

## 2024-09-19 NOTE — Progress Notes (Signed)
 Leron Glance, NP-C Phone: [phone]  Bridget Gould is a 48 y.o. female who presents today for follow up.   Discussed the use of AI scribe software for clinical note transcription with the patient, who gave verbal consent to proceed.  History of Present Illness   Bridget Gould is a 48 year old female who presents with new onset shoulder discomfort.  She has been experiencing shoulder discomfort for about a month, describing it as an ache rather than a stabbing pain. The discomfort worsens with certain movements such as hooking her bra, reaching down, and laying on it at night. Some days, the shoulder feels weak, requiring conscious effort to move it. Conservative treatments including ice, heat, ibuprofen, Icy Hot, and Salonpas patches have not provided relief.  She denies any recent injury, trauma, or overuse, although she had been participating in weightlifting activities, which she has since paused. She reports no issues with forward or upward movement, but experiences discomfort when moving the shoulder backward. Sideways movement is not problematic.  Her current medications include amlodipine  10 mg for blood pressure. She is also on a low-dose antibiotic for UTIs, Zoloft  for mood, and hydroxyzine  as needed. She has been taking turmeric, which she feels has helped with swelling.  In terms of lifestyle, she follows a high-protein, low-carb diet with lots of vegetables, primarily due to her husband's heart condition. She engages in regular physical activity, including [employer] workouts, using an elliptical and stationary bike.  No chest pain, shortness of breath, dizziness, or swelling. Reports shoulder discomfort and weakness, particularly with backward movement.      Social History   Tobacco Use  Smoking Status Never  Smokeless Tobacco Never    Current Outpatient Medications on File Prior to Visit  Medication Sig Dispense Refill   amLODipine  (NORVASC ) 10 MG tablet Take 1  tablet (10 mg total) by mouth daily. 90 tablet 3   calcipotriene  (DOVONOX) 0.005 % cream Apply topically 2 (two) times daily. 60 g 5   clobetasol  cream (TEMOVATE ) 0.05 % Apply thin layer to aa's at left posterior shoulder bid for 2wks. If rash still persist can use qd after 2 weeks. Avoid applying to face, groin, and axilla. Use as directed. 30 g 0   fluticasone  (FLONASE ) 50 MCG/ACT nasal spray Place 2 sprays into both nostrils daily. 16 g 6   hydrocortisone  2.5 % cream Apply topically.     hydrOXYzine  (ATARAX ) 10 MG tablet Take 1 tablet (10 mg total) by mouth 3 (three) times daily as needed. 30 tablet 0   norethindrone  (MICRONOR ) 0.35 MG tablet Take 1 tablet (0.35 mg total) by mouth daily. 84 tablet 3   Ruxolitinib Phosphate  (OPZELURA ) 1.5 % CREA Apply twice daily as directed 60 g 2   sertraline  (ZOLOFT ) 50 MG tablet Take 1 tablet (50 mg total) by mouth daily. 90 tablet 3   TESTOSTERONE NA Place 5 mg under the tongue daily.     trimethoprim (TRIMPEX) 100 MG tablet Take 100 mg by mouth daily.     No current facility-administered medications on file prior to visit.     ROS see history of present illness  Objective  Physical Exam Vitals:   09/19/24 1530  BP: 124/80  Pulse: 68  Temp: 99 F (37.2 C)  SpO2: 99%    BP Readings from Last 3 Encounters:  09/19/24 124/80  02/25/24 128/74  02/15/24 126/72   Wt Readings from Last 3 Encounters:  09/19/24 295 lb (133.8 kg)  02/25/24 (!) 319  lb (144.7 kg)  02/15/24 (!) 313 lb 12.8 oz (142.3 kg)    Physical Exam Constitutional:      General: She is not in acute distress.    Appearance: Normal appearance.  HENT:     Head: Normocephalic.  Cardiovascular:     Rate and Rhythm: Normal rate and regular rhythm.     Heart sounds: Normal heart sounds.  Pulmonary:     Effort: Pulmonary effort is normal.     Breath sounds: Normal breath sounds.  Musculoskeletal:     Right shoulder: Crepitus present. No swelling or tenderness. Decreased  range of motion. Decreased strength.  Skin:    General: Skin is warm and dry.  Neurological:     General: No focal deficit present.     Mental Status: She is alert.  Psychiatric:        Mood and Affect: Mood normal.        Behavior: Behavior normal.      Assessment/Plan: Please see individual problem list.  Acute pain of right shoulder Assessment & Plan: She experiences persistent pain and weakness with limited range of motion, possibly due to frozen shoulder or arthritis. Ordered a right shoulder x-ray and referred her to physical therapy. Consider orthopedic referral if necessary. Continue over the counter conservative management. We will continue to monitor.   Orders: -     DG Shoulder Right; Future -     Ambulatory referral to Physical Therapy  Morbid obesity with BMI of 45.0-49.9, adult [facility]) Assessment & Plan: Her obesity is managed with diet and exercise. Unable to get Zepbound . Recent weight loss of approximately 25 pounds. Continue the current dietary and exercise regimen. Monitor medication availability and cost.    Anxiety and depression Assessment & Plan: Her anxiety and depression are managed with Zoloft  and hydroxyzine . Her mood remains stable despite job stress. Continue Zoloft  50 mg daily. Use hydroxyzine  as needed for anxiety. Encouraged to contact if worsening symptoms, unusual behavior changes or suicidal thoughts occur.    Hypertension, unspecified type Assessment & Plan: Her hypertension is well-controlled on Norvasc . Continue Norvasc  10 mg daily.    Need for influenza vaccination -     Flu vaccine trivalent PF, 6mos and older(Flulaval,Afluria,Fluarix,Fluzone)     Return in about 6 months (around 03/19/2025) for Follow up.   Leron Glance, NP-C [facility]

## 2024-09-20 ENCOUNTER — Encounter: Payer: Self-pay | Admitting: Obstetrics and Gynecology

## 2024-09-27 ENCOUNTER — Ambulatory Visit: Payer: Self-pay | Admitting: Nurse Practitioner

## 2024-10-04 ENCOUNTER — Encounter: Payer: Self-pay | Admitting: Nurse Practitioner

## 2024-10-04 NOTE — Assessment & Plan Note (Signed)
 Her hypertension is well-controlled on Norvasc . Continue Norvasc  10 mg daily.

## 2024-10-04 NOTE — Assessment & Plan Note (Signed)
 Her anxiety and depression are managed with Zoloft  and hydroxyzine . Her mood remains stable despite job stress. Continue Zoloft  50 mg daily. Use hydroxyzine  as needed for anxiety. Encouraged to contact if worsening symptoms, unusual behavior changes or suicidal thoughts occur.

## 2024-10-04 NOTE — Assessment & Plan Note (Signed)
 Her obesity is managed with diet and exercise. Unable to get Zepbound . Recent weight loss of approximately 25 pounds. Continue the current dietary and exercise regimen. Monitor medication availability and cost.

## 2024-10-04 NOTE — Assessment & Plan Note (Signed)
 She experiences persistent pain and weakness with limited range of motion, possibly due to frozen shoulder or arthritis. Ordered a right shoulder x-ray and referred her to physical therapy. Consider orthopedic referral if necessary. Continue over the counter conservative management. We will continue to monitor.

## 2024-10-24 ENCOUNTER — Encounter: Payer: Self-pay | Admitting: Obstetrics and Gynecology

## 2024-11-06 ENCOUNTER — Other Ambulatory Visit: Payer: Self-pay | Admitting: Obstetrics and Gynecology

## 2024-11-06 DIAGNOSIS — Z1231 Encounter for screening mammogram for malignant neoplasm of breast: Secondary | ICD-10-CM

## 2024-11-14 ENCOUNTER — Other Ambulatory Visit: Payer: Self-pay | Admitting: Nurse Practitioner

## 2024-11-14 DIAGNOSIS — J309 Allergic rhinitis, unspecified: Secondary | ICD-10-CM

## 2024-12-13 ENCOUNTER — Encounter

## 2025-03-27 ENCOUNTER — Ambulatory Visit: Admitting: Nurse Practitioner

## 2025-09-11 ENCOUNTER — Ambulatory Visit: Admitting: Dermatology
